# Patient Record
Sex: Male | Born: 1960 | Race: Black or African American | Hispanic: No | Marital: Married | Smoking: Current every day smoker
Health system: Southern US, Community
[De-identification: ages and names within clinical notes are randomized; demographics above are authoritative.]

## PROBLEM LIST (undated history)

## (undated) DIAGNOSIS — M1990 Unspecified osteoarthritis, unspecified site: Secondary | ICD-10-CM

## (undated) DIAGNOSIS — G8929 Other chronic pain: Secondary | ICD-10-CM

## (undated) DIAGNOSIS — I10 Essential (primary) hypertension: Secondary | ICD-10-CM

## (undated) DIAGNOSIS — K219 Gastro-esophageal reflux disease without esophagitis: Secondary | ICD-10-CM

## (undated) DIAGNOSIS — M549 Dorsalgia, unspecified: Secondary | ICD-10-CM

## (undated) DIAGNOSIS — R011 Cardiac murmur, unspecified: Secondary | ICD-10-CM

## (undated) DIAGNOSIS — Z91148 Patient's other noncompliance with medication regimen for other reason: Secondary | ICD-10-CM

## (undated) DIAGNOSIS — Z9114 Patient's other noncompliance with medication regimen: Secondary | ICD-10-CM

## (undated) HISTORY — DX: Patient's other noncompliance with medication regimen: Z91.14

## (undated) HISTORY — PX: OTHER SURGICAL HISTORY: SHX169

## (undated) HISTORY — DX: Other chronic pain: G89.29

## (undated) HISTORY — DX: Patient's other noncompliance with medication regimen for other reason: Z91.148

## (undated) HISTORY — DX: Dorsalgia, unspecified: M54.9

## (undated) HISTORY — DX: Essential (primary) hypertension: I10

---

## 2005-07-02 ENCOUNTER — Emergency Department (HOSPITAL_COMMUNITY): Admission: EM | Admit: 2005-07-02 | Discharge: 2005-07-02 | Payer: Self-pay | Admitting: Emergency Medicine

## 2009-11-01 ENCOUNTER — Emergency Department (HOSPITAL_COMMUNITY): Admission: EM | Admit: 2009-11-01 | Discharge: 2009-11-01 | Payer: Self-pay | Admitting: Emergency Medicine

## 2010-07-14 LAB — URINALYSIS, ROUTINE W REFLEX MICROSCOPIC
Bilirubin Urine: NEGATIVE
Glucose, UA: NEGATIVE mg/dL
Ketones, ur: NEGATIVE mg/dL
Leukocytes, UA: NEGATIVE
Nitrite: NEGATIVE
Specific Gravity, Urine: >1.03 — ABNORMAL HIGH (ref 1.005–1.030)
Urobilinogen, UA: 0.2 mg/dL (ref 0.0–1.0)
pH: 5 (ref 5.0–8.0)

## 2010-07-14 LAB — URINE MICROSCOPIC-ADD ON

## 2010-07-14 LAB — RPR: RPR Ser Ql: NONREACTIVE

## 2010-07-14 LAB — POCT I-STAT, CHEM 8
BUN: 13 mg/dL (ref 6–23)
Calcium, Ion: 1.13 mmol/L (ref 1.12–1.32)
Chloride: 106 mEq/L (ref 96–112)
Creatinine, Ser: 1.1 mg/dL (ref 0.4–1.5)
Glucose, Bld: 66 mg/dL — ABNORMAL LOW (ref 70–99)
HCT: 49 % (ref 39.0–52.0)
Hemoglobin: 16.7 g/dL (ref 13.0–17.0)
Potassium: 4.1 mEq/L (ref 3.5–5.1)
Sodium: 141 mEq/L (ref 135–145)
TCO2: 27 mmol/L (ref 0–100)

## 2010-07-14 LAB — GC/CHLAMYDIA PROBE AMP, GENITAL
Chlamydia, DNA Probe: NEGATIVE
GC Probe Amp, Genital: NEGATIVE

## 2010-11-11 ENCOUNTER — Emergency Department (HOSPITAL_COMMUNITY)
Admission: EM | Admit: 2010-11-11 | Discharge: 2010-11-11 | Disposition: A | Discharge status: Home / Self Care | Payer: Worker's Compensation | Attending: Emergency Medicine | Admitting: Emergency Medicine

## 2010-11-11 ENCOUNTER — Emergency Department (HOSPITAL_COMMUNITY): Payer: Worker's Compensation

## 2010-11-11 DIAGNOSIS — M25519 Pain in unspecified shoulder: Secondary | ICD-10-CM | POA: Insufficient documentation

## 2010-11-11 DIAGNOSIS — M25529 Pain in unspecified elbow: Secondary | ICD-10-CM | POA: Insufficient documentation

## 2010-11-11 DIAGNOSIS — R0602 Shortness of breath: Secondary | ICD-10-CM | POA: Insufficient documentation

## 2010-11-11 DIAGNOSIS — R51 Headache: Secondary | ICD-10-CM | POA: Insufficient documentation

## 2010-11-11 DIAGNOSIS — S51009A Unspecified open wound of unspecified elbow, initial encounter: Secondary | ICD-10-CM | POA: Insufficient documentation

## 2010-11-11 DIAGNOSIS — Y990 Civilian activity done for income or pay: Secondary | ICD-10-CM | POA: Insufficient documentation

## 2010-11-11 DIAGNOSIS — S0100XA Unspecified open wound of scalp, initial encounter: Secondary | ICD-10-CM | POA: Insufficient documentation

## 2010-11-11 DIAGNOSIS — W240XXA Contact with lifting devices, not elsewhere classified, initial encounter: Secondary | ICD-10-CM | POA: Insufficient documentation

## 2010-11-11 DIAGNOSIS — S0990XA Unspecified injury of head, initial encounter: Secondary | ICD-10-CM | POA: Insufficient documentation

## 2010-11-11 DIAGNOSIS — R0789 Other chest pain: Secondary | ICD-10-CM | POA: Insufficient documentation

## 2010-11-11 LAB — ETHANOL: Alcohol, Ethyl (B): <11 mg/dL (ref 0–11)

## 2010-11-11 LAB — POCT I-STAT, CHEM 8
BUN: 18 mg/dL (ref 6–23)
Calcium, Ion: 1.18 mmol/L (ref 1.12–1.32)
Chloride: 105 mEq/L (ref 96–112)
Creatinine, Ser: 1 mg/dL (ref 0.50–1.35)
Glucose, Bld: 97 mg/dL (ref 70–99)
HCT: 43 % (ref 39.0–52.0)
Hemoglobin: 14.6 g/dL (ref 13.0–17.0)
Potassium: 4.1 mEq/L (ref 3.5–5.1)
Sodium: 142 mEq/L (ref 135–145)
TCO2: 25 mmol/L (ref 0–100)

## 2010-11-11 LAB — PROTIME-INR
INR: 1.08 (ref 0.00–1.49)
Prothrombin Time: 14.2 s (ref 11.6–15.2)

## 2010-11-16 ENCOUNTER — Encounter: Payer: Self-pay | Admitting: *Deleted

## 2010-11-16 ENCOUNTER — Emergency Department (HOSPITAL_COMMUNITY): Payer: Worker's Compensation

## 2010-11-16 ENCOUNTER — Emergency Department (HOSPITAL_COMMUNITY)
Admission: EM | Admit: 2010-11-16 | Discharge: 2010-11-16 | Disposition: A | Discharge status: Home / Self Care | Payer: Worker's Compensation | Attending: Emergency Medicine | Admitting: Emergency Medicine

## 2010-11-16 DIAGNOSIS — R079 Chest pain, unspecified: Secondary | ICD-10-CM | POA: Insufficient documentation

## 2010-11-16 DIAGNOSIS — M25519 Pain in unspecified shoulder: Secondary | ICD-10-CM

## 2010-11-16 DIAGNOSIS — F17200 Nicotine dependence, unspecified, uncomplicated: Secondary | ICD-10-CM | POA: Insufficient documentation

## 2010-11-16 DIAGNOSIS — S2239XA Fracture of one rib, unspecified side, initial encounter for closed fracture: Secondary | ICD-10-CM | POA: Insufficient documentation

## 2010-11-16 DIAGNOSIS — W240XXA Contact with lifting devices, not elsewhere classified, initial encounter: Secondary | ICD-10-CM | POA: Insufficient documentation

## 2010-11-16 MED ORDER — OXYCODONE-ACETAMINOPHEN 5-325 MG PO TABS
1.0000 | ORAL_TABLET | Freq: Once | ORAL | Status: AC
  Administered 2010-11-16: 1 via ORAL
  Filled 2010-11-16: qty 1

## 2010-11-16 MED ORDER — OXYCODONE-ACETAMINOPHEN 5-325 MG PO TABS
1.0000 | ORAL_TABLET | ORAL | Status: AC | PRN
Start: 2010-11-16 — End: 2010-11-26

## 2010-11-16 NOTE — ED Notes (Signed)
Pain in left shoulder and left anterior rib area, hurts to move

## 2010-11-16 NOTE — ED Provider Notes (Signed)
History     Chief Complaint  Patient presents with  . Rib pain    The history is provided by the patient.  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 He was given Hydrocodone and Ibuprofen for pain, but has been having increasing pain in the left rib cage. He is also complaining that he can't lift his left arm. Pain is worse with movement and with palpation. Pain is severe - currently rated 8/10. He denies other problems.  History reviewed. No pertinent past medical history.  Past Surgical History  Procedure Date  . Right arm surgery     No family history on file.  History  Substance Use Topics  . Smoking status: Current Everyday Smoker -- 0.5 packs/day    Types: Cigarettes  . Smokeless tobacco: Not on file  . Alcohol Use: Yes     OCC      Review of Systems  Cardiovascular: Positive for chest pain.  All other systems reviewed and are negative.    Physical Exam  BP 154/82  Pulse 63  Temp(Src) 98 F (36.7 C) (Oral)  Resp 16  Ht 6\' 3"  (1.905 m)  Wt 215 lb (97.523 kg)  BMI 26.87 kg/m2  SpO2 98%  Physical Exam  Nursing note and vitals reviewed. Constitutional: He is oriented to person, place, and time. He appears well-developed and well-nourished. No distress.  HENT:  Head: Normocephalic.  Right Ear: External ear normal.  Left Ear: External ear normal.  Mouth/Throat: Oropharynx is clear and moist.       Scalp laceration with staples present, healing well.  Eyes: EOM are normal. Pupils are equal, round, and reactive to light. Left eye exhibits no discharge. No scleral icterus.  Neck: Normal range of motion. Neck supple. No JVD present.  Cardiovascular: Normal rate, regular rhythm and normal heart sounds.   No murmur heard. Pulmonary/Chest: Effort normal and breath sounds normal. He has no wheezes. He has no rales.       Marked tenderness left lateral chest wall.    Abdominal: Soft. Bowel sounds are normal. He exhibits no mass. There is no tenderness.  Musculoskeletal: He exhibits no edema.       Mild pain on passive ROM left shoulder. Pulses are strong, but there is an area of the left forearm with slightly decreased pinprick sensation.  Lymphadenopathy:    He has no cervical adenopathy.  Neurological: He is alert and oriented to person, place, and time. No cranial nerve deficit. Coordination normal.  Skin: Skin is warm and dry. No rash noted.  Psychiatric: He has a normal mood and affect.    ED Course  Procedures  MDM ED record from 11/11/2010 reviewed. He had a negative chest x-ray and left shoulder x-ray. CT of cervical spine showed degenerative changes without acute injury. He was discharged with prescriptions for Vicodin and Ibuprofen.  Satisfactory pain relief from Percocet. Placed in a sling for comfort and referred to orthopedics.  Results for orders placed during the hospital encounter of 11/11/10  POCT I-STAT, CHEM 8      Component Value Range   Sodium 142  135 - 145 (mEq/L)   Potassium 4.1  3.5 - 5.1 (mEq/L)   Chloride 105  96 - 112 (mEq/L)   BUN 18  6 - 23 (mg/dL)   Creatinine, Ser 1.61  0.50 - 1.35 (mg/dL)   Glucose, Bld 97  70 - 99 (mg/dL)   Calcium, Ion 1.61  0.96 - 1.32 (mmol/L)   TCO2 25  0 - 100 (mmol/L)   Hemoglobin 14.6  13.0 - 17.0 (g/dL)   HCT 04.5  40.9 - 81.1 (%)  PROTIME-INR      Component Value Range   Prothrombin Time 14.2  11.6 - 15.2 (seconds)   INR 1.08  0.00 - 1.49   ETHANOL      Component Value Range   Alcohol, Ethyl (B) <11  0 - 11 (mg/dL)   Dg Chest 2 View  01/10/7828  *RADIOLOGY REPORT*  Clinical Data: Shortness of breath and chest discomfort after a fall.  CHEST - 2 VIEW  Comparison: None.  Findings: Trachea is midline.  Heart size is accentuated by AP technique.  There may be minimal subsegmental atelectasis at the left lung base.  Lungs are otherwise clear.  No pleural fluid.  IMPRESSION: Minimal  left basilar subsegmental atelectasis.  Original Report Authenticated By: Reyes Ivan, M.D.   Dg Ribs Unilateral W/chest Left  11/16/2010  *RADIOLOGY REPORT*  Clinical Data: Left rib pain.  Forklift injury.  LEFT RIBS AND CHEST - 3+ VIEW  Comparison: 11/11/2010  Findings: No pneumothorax or pleural effusion identified.  Cardiac and mediastinal contours appear unremarkable.  The lungs appear clear.  Subtle irregularity of the left lateral eighth rib could possibly reflect fracture.  IMPRESSION:  1.  Subtle irregularity of the left lateral eighth rib, possibly a nondisplaced fracture.   Otherwise, no significant abnormality identified.  Original Report Authenticated By: Dellia Cloud, M.D.   Dg Elbow Complete Right  11/11/2010  *RADIOLOGY REPORT*  Clinical Data: Laceration or posterior elbow.  RIGHT ELBOW - COMPLETE 3+ VIEW  Comparison: None  Findings: No acute bony abnormality.  Specifically, no fracture, subluxation, or dislocation.  Soft tissues are intact.  No joint effusion.  IMPRESSION: No acute bony abnormality.  Original Report Authenticated By: Cyndie Chime, M.D.   Ct Head Wo Contrast  11/11/2010  *RADIOLOGY REPORT*  Clinical Data:  Fall.  Pain  CT HEAD WITHOUT CONTRAST CT CERVICAL SPINE WITHOUT CONTRAST  Technique:  Multidetector CT imaging of the head and cervical spine was performed following the standard protocol without intravenous contrast.  Multiplanar CT image reconstructions of the cervical spine were also generated.  Comparison:  None.  CT HEAD  Findings: Ventricle size is normal.  Negative for intracranial hemorrhage.  Negative for infarct or mass.  Calvarium is intact. Mild sinusitis.  IMPRESSION: No acute intracranial abnormality.  Sinusitis  CT CERVICAL SPINE  Findings: Negative for fracture.  Normal cervical alignment.  Moderate disc degeneration and spurring C3-4 and C4-5.  Mild disc degeneration and spurring C4-5 and C5-6.  IMPRESSION: Negative for fracture.  Original  Report Authenticated By: Camelia Phenes, M.D.   Ct Cervical Spine Wo Contrast  11/11/2010  *RADIOLOGY REPORT*  Clinical Data:  Fall.  Pain  CT HEAD WITHOUT CONTRAST CT CERVICAL SPINE WITHOUT CONTRAST  Technique:  Multidetector CT imaging of the head and cervical spine was performed following the standard protocol without intravenous contrast.  Multiplanar CT image reconstructions of the cervical spine were also generated.  Comparison:  None.  CT HEAD  Findings: Ventricle size is normal.  Negative for intracranial hemorrhage.  Negative for infarct or mass.  Calvarium is intact. Mild sinusitis.  IMPRESSION: No acute intracranial abnormality.  Sinusitis  CT CERVICAL SPINE  Findings: Negative for fracture.  Normal cervical alignment.  Moderate disc degeneration and spurring  C3-4 and C4-5.  Mild disc degeneration and spurring C4-5 and C5-6.  IMPRESSION: Negative for fracture.  Original Report Authenticated By: Camelia Phenes, M.D.   Dg Shoulder Left  11/11/2010  *RADIOLOGY REPORT*  Clinical Data: Severe pain in the left shoulder.  LEFT SHOULDER - 2+ VIEW  Comparison: None.  Findings: Mild degenerative changes in the left AC joint. Glenohumeral joint is unremarkable.  No fracture, subluxation or dislocation.  There is a question of a small nodular density in the left upper lobe.  However, when comparing to today's chest x-ray, no abnormalities seen in this area.  This is felt to represent overlapping shadows.  IMPRESSION: Degenerative changes in the left AC joint.  No acute findings.  Original Report Authenticated By: Cyndie Chime, M.D.         Dione Booze, MD 11/16/10 1052

## 2010-11-16 NOTE — ED Notes (Signed)
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 Was seen at [facility]. Pt states left rib pain and left arm pain began on Tuesday. Also states left leg goes  Numb when standing or sitting too long.

## 2010-11-21 ENCOUNTER — Encounter (HOSPITAL_COMMUNITY): Payer: Self-pay

## 2010-11-21 ENCOUNTER — Emergency Department (HOSPITAL_COMMUNITY)
Admission: EM | Admit: 2010-11-21 | Discharge: 2010-11-21 | Disposition: A | Discharge status: Home / Self Care | Payer: Worker's Compensation | Attending: Emergency Medicine | Admitting: Emergency Medicine

## 2010-11-21 DIAGNOSIS — Z4802 Encounter for removal of sutures: Secondary | ICD-10-CM | POA: Insufficient documentation

## 2010-11-21 DIAGNOSIS — IMO0002 Reserved for concepts with insufficient information to code with codable children: Secondary | ICD-10-CM

## 2010-11-21 NOTE — ED Provider Notes (Signed)
History     Chief Complaint  Patient presents with   Suture / Staple Removal   Patient is a 50 y.o. male presenting with suture removal. The history is provided by the patient. No language interpreter was used.  Suture / Staple Removal  The sutures were placed 7 to 10 days ago. Treatments since wound repair include regular soap and water washings. There has been no drainage from the wound. There is no redness present. There is no swelling present. The pain has no pain. He has no difficulty moving the affected extremity or digit.  Patient to ED for suture removal from right elbow and staple removal from right parietal scalp which were placed 10 days ago in ED. 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 No other complaints. Denies HA, fever, erythema.  History reviewed. No pertinent past medical history.  Past Surgical History  Procedure Date   Right arm surgery     History reviewed. No pertinent family history.  History  Substance Use Topics   Smoking status: Current Everyday Smoker -- 0.5 packs/day    Types: Cigarettes   Smokeless tobacco: Not on file   Alcohol Use: Yes     OCC      Review of Systems  Constitutional: Negative for fever.  HENT: Negative for neck pain.   Gastrointestinal: Negative for nausea and vomiting.  Skin: Negative for color change and rash.  Neurological: Negative for headaches.  All other systems reviewed and are negative.  All other systems negative except as noted in HPI.   Physical Exam  BP 132/79   Pulse 71   Temp(Src) 97.8 F (36.6 C) (Oral)   Resp 20   Ht 6\' 3"  (1.905 m)   Wt 215 lb (97.523 kg)   BMI 26.87 kg/m2   SpO2 97%  Physical Exam  Nursing note and vitals reviewed. Constitutional: He is oriented to person, place, and time. He appears well-developed and well-nourished. No distress.       Appearance consistent with age of  record  HENT:  Head: Normocephalic.  Right Ear: External ear normal.  Left Ear: External ear normal.  Mouth/Throat: Oropharynx is clear and moist.       16 staples to right parietal region with no signs of infection, erythema or swelling noted.   Eyes: Conjunctivae are normal.  Neck: Normal range of motion. Neck supple.  Cardiovascular: Normal rate and regular rhythm.   Pulmonary/Chest: Effort normal. He has no rhonchi.  Musculoskeletal: Normal range of motion.       Normal appearance of extremities  Neurological: He is alert and oriented to person, place, and time. No sensory deficit.  Skin: Skin is warm and dry. No rash noted. No erythema.       Color normal. Stiches to right elbow with no signs of infection, erythema or swelling noted.   Psychiatric: He has a normal mood and affect. His behavior is normal.    ED Course  Procedures  MDM Wounds healing well at this time with no signs of infection noted.    Chart written by Clarita Crane acting as scribe for Donnetta Hutching, MD  I personally performed the services described in this documentation, which was scribed in my presence. The recorded information has been reviewed and considered. Donnetta Hutching, MD

## 2010-11-21 NOTE — ED Provider Notes (Addendum)
History     Chief Complaint  Patient presents with  . Suture / Staple Removal   HPI rmove staples/stitches History reviewed. No pertinent past medical history.  Past Surgical History  Procedure Date  . Right arm surgery     History reviewed. No pertinent family history.  History  Substance Use Topics  . Smoking status: Current Everyday Smoker -- 0.5 packs/day    Types: Cigarettes  . Smokeless tobacco: Not on file  . Alcohol Use: Yes     OCC      Review of Systems All other systems negative except as noted in HPI.   Physical Exam  BP 132/79  Pulse 71  Temp(Src) 97.8 F (36.6 C) (Oral)  Resp 20  Ht 6\' 3"  (1.905 m)  Wt 215 lb (97.523 kg)  BMI 26.87 kg/m2  SpO2 97%  Physical Exam Patient with stables in right parietal scalp which is healing well stitches in right elbow ED Course  Procedures  MDM Wounds healing well    Chart written by Clarita Crane acting as scribe for Donnetta Hutching, MD  I personally performed the services described in this documentation, which was scribed in my presence. The recorded information has been reviewed and considered. Donnetta Hutching, MD    Donnetta Hutching, MD 11/21/10 5621  Donnetta Hutching, MD 11/21/10 [phone]

## 2010-11-21 NOTE — ED Notes (Signed)
Stitches removed from right elbow and rt side of top of head prior to discharge. No bleeding or drainage.

## 2010-11-21 NOTE — ED Notes (Signed)
Pt has staples put in his head and rt elbow on the July 16th.  He is here to have them removed

## 2010-11-21 NOTE — ED Notes (Signed)
Pt here to have stitches and staples removed. Pt has staples in place on the right side of the top of his head. Also has stitches in place rt elbow. No redness or drainage noted. Pt states that he has had them in place 10 days.

## 2010-11-24 ENCOUNTER — Encounter (HOSPITAL_COMMUNITY): Payer: Self-pay | Admitting: *Deleted

## 2010-11-24 ENCOUNTER — Emergency Department (HOSPITAL_COMMUNITY)
Admission: EM | Admit: 2010-11-24 | Discharge: 2010-11-25 | Disposition: A | Discharge status: Home / Self Care | Payer: Worker's Compensation | Attending: Emergency Medicine | Admitting: Emergency Medicine

## 2010-11-24 DIAGNOSIS — S2239XA Fracture of one rib, unspecified side, initial encounter for closed fracture: Secondary | ICD-10-CM | POA: Insufficient documentation

## 2010-11-24 DIAGNOSIS — M79629 Pain in unspecified upper arm: Secondary | ICD-10-CM

## 2010-11-24 DIAGNOSIS — M79609 Pain in unspecified limb: Secondary | ICD-10-CM | POA: Insufficient documentation

## 2010-11-24 DIAGNOSIS — W240XXA Contact with lifting devices, not elsewhere classified, initial encounter: Secondary | ICD-10-CM | POA: Insufficient documentation

## 2010-11-24 DIAGNOSIS — F17200 Nicotine dependence, unspecified, uncomplicated: Secondary | ICD-10-CM | POA: Insufficient documentation

## 2010-11-24 DIAGNOSIS — IMO0001 Reserved for inherently not codable concepts without codable children: Secondary | ICD-10-CM | POA: Insufficient documentation
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
## 2010-11-24 NOTE — ED Provider Notes (Signed)
History     Chief Complaint  Patient presents with  . Muscle Pain   HPI  History reviewed. No pertinent past medical history.  Past Surgical History  Procedure Date  . Right arm surgery     No family history on file.  History  Substance Use Topics  . Smoking status: Current Everyday Smoker -- 0.5 packs/day    Types: Cigarettes  . Smokeless tobacco: Not on file  . Alcohol Use: Yes     OCC      Review of Systems  Respiratory: Negative for shortness of breath and wheezing.        Pain from L rib fx.  Musculoskeletal:       L arm and shoulder pain.  In sling.  All other systems reviewed and are negative.    Physical Exam  BP 136/80  Pulse 70  Resp 18  Ht 6\' 3"  (1.905 m)  Wt 215 lb (97.523 kg)  BMI 26.87 kg/m2  SpO2 99%  Physical Exam  Nursing note and vitals reviewed. Constitutional: He is oriented to person, place, and time. Vital signs are normal. He appears well-developed and well-nourished.  HENT:  Head: Normocephalic and atraumatic.  Right Ear: External ear normal.  Left Ear: External ear normal.  Nose: Nose normal.  Mouth/Throat: No oropharyngeal exudate.  Eyes: Conjunctivae and EOM are normal. Pupils are equal, round, and reactive to light. Right eye exhibits no discharge. Left eye exhibits no discharge. No scleral icterus.  Neck: Normal range of motion. Neck supple. No JVD present. No tracheal deviation present. No thyromegaly present.  Cardiovascular: Normal rate, regular rhythm, normal heart sounds, intact distal pulses and normal pulses.  Exam reveals no gallop and no friction rub.   No murmur heard. Pulmonary/Chest: Effort normal and breath sounds normal. No stridor. No respiratory distress. He has no wheezes. He has no rales. He exhibits tenderness.  Abdominal: Soft. Normal appearance and bowel sounds are normal. He exhibits no distension and no mass. There is no tenderness. There is no rebound and no guarding.  Musculoskeletal: He exhibits no  edema and no tenderness.       Arms:      L arm and shoulder pain.  Worse with movement.  Lymphadenopathy:    He has no cervical adenopathy.  Neurological: He is alert and oriented to person, place, and time. He has normal reflexes. No cranial nerve deficit. Coordination normal. GCS eye subscore is 4. GCS verbal subscore is 5. GCS motor subscore is 6.  Reflex Scores:      Tricep reflexes are 2+ on the right side and 2+ on the left side.      Bicep reflexes are 2+ on the right side and 2+ on the left side.      Brachioradialis reflexes are 2+ on the right side and 2+ on the left side.      Patellar reflexes are 2+ on the right side and 2+ on the left side.      Achilles reflexes are 2+ on the right side and 2+ on the left side. Skin: Skin is warm and dry. No rash noted. He is not diaphoretic.  Psychiatric: He has a normal mood and affect. His speech is normal and behavior is normal. Judgment and thought content normal. Cognition and memory are normal.    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  He is out of oxycodone and is scheduled to  see dr. Hilda Lias tomorrow for therapy.      Worthy Rancher, PA 11/24/10 2353  Worthy Rancher, PA 11/24/10 2355  Worthy Rancher, PA 11/24/10 2357  Medical screening examination/treatment/procedure(s) were conducted as a shared visit with non-physician practitioner(s) and myself.  I personally evaluated the patient during the encounter  Nicoletta Dress. Colon Branch, MD 11/29/10 [phone]

## 2010-11-25 MED ORDER — IBUPROFEN 800 MG PO TABS
800.0000 mg | ORAL_TABLET | Freq: Once | ORAL | Status: AC
  Administered 2010-11-25: 800 mg via ORAL
  Filled 2010-11-25: qty 1

## 2010-11-25 MED ORDER — OXYCODONE-ACETAMINOPHEN 5-325 MG PO TABS: ORAL_TABLET | ORAL | Status: DC

## 2010-11-25 MED ORDER — OXYCODONE-ACETAMINOPHEN 5-325 MG PO TABS
1.0000 | ORAL_TABLET | Freq: Once | ORAL | Status: AC
  Administered 2010-11-25: 1 via ORAL
  Filled 2010-11-25: qty 1

## 2011-02-17 NOTE — ED Provider Notes (Signed)
History     CSN: [id number] Arrival date & time: 11/24/2010 10:55 PM   None     Chief Complaint  Patient presents with  . Muscle Pain    (Consider location/radiation/quality/duration/timing/severity/associated sxs/prior treatment) HPI Comments: Pt was seen in ED on 7-21=12 and x with L 8th rib fracture and L shoulder/neck/upper L arm pain.  Out of pain med.  Scheduled to see dr. Hilda Lias tomorrow  Patient is a 50 y.o. male presenting with musculoskeletal pain. The history is provided by the patient. No language interpreter was used.  Muscle Pain This is a new problem. Episode onset: 7-8 days ago. The problem occurs constantly. The problem has been unchanged. Associated symptoms include chest pain. The symptoms are aggravated by coughing, bending, twisting, walking and sneezing. He has tried NSAIDs and oral narcotics for the symptoms. The treatment provided moderate relief.    History reviewed. No pertinent past medical history.  Past Surgical History  Procedure Date  . Right arm surgery     No family history on file.  History  Substance Use Topics  . Smoking status: Current Everyday Smoker -- 0.5 packs/day    Types: Cigarettes  . Smokeless tobacco: Not on file  . Alcohol Use: Yes     OCC      Review of Systems  Cardiovascular: Positive for chest pain.  All other systems reviewed and are negative.    Allergies  Review of patient's allergies indicates no known allergies.  Home Medications   Current Outpatient Rx  Name Route Sig Dispense Refill  . IBUPROFEN 600 MG PO TABS Oral Take 600 mg by mouth every 6 (six) hours as needed. Pain     . IBUPROFEN 200 MG PO TABS Oral Take 600 mg by mouth every 6 (six) hours as needed. FOR PAIN  OTC     . OXYCODONE-ACETAMINOPHEN 5-325 MG PO TABS  One tab po q 4-6 hrs prn pain 20 tablet 0    BP 136/80  Pulse 70  Resp 18  Ht 6\' 3"  (1.905 m)  Wt 215 lb (97.523 kg)  BMI 26.87 kg/m2  SpO2 99%  Physical Exam  Nursing note and  vitals reviewed. Constitutional: He is oriented to person, place, and time. He appears well-developed and well-nourished.  HENT:  Head: Normocephalic and atraumatic.  Eyes: EOM are normal.  Neck: Normal range of motion.  Cardiovascular: Normal rate, regular rhythm, normal heart sounds and intact distal pulses.   Pulmonary/Chest: Effort normal and breath sounds normal. No accessory muscle usage. Not tachypneic. No respiratory distress. He exhibits tenderness.  Abdominal: Soft. He exhibits no distension. There is no tenderness.  Musculoskeletal: Normal range of motion.  Neurological: He is alert and oriented to person, place, and time.  Skin: Skin is warm and dry.  Psychiatric: He has a normal mood and affect. Judgment normal.    ED Course  Procedures (including critical care time)  Labs Reviewed - No data to display No results found.   1. Upper arm pain   2. Closed fracture of rib(s), unspecified       MDM          Worthy Rancher, PA 02/17/11 1539  Worthy Rancher, PA 03/06/11 [id number]

## 2011-03-10 NOTE — ED Provider Notes (Signed)
Medical screening examination/treatment/procedure(s) were performed by non-physician practitioner and as supervising physician I was immediately available for consultation/collaboration.  Nicoletta Dress. Colon Branch, MD 03/10/11 [phone]

## 2011-05-22 ENCOUNTER — Encounter (HOSPITAL_COMMUNITY): Payer: Self-pay | Admitting: *Deleted

## 2011-05-22 ENCOUNTER — Emergency Department (HOSPITAL_COMMUNITY)
Admission: EM | Admit: 2011-05-22 | Discharge: 2011-05-22 | Disposition: A | Discharge status: Home / Self Care | Payer: Self-pay | Attending: Emergency Medicine | Admitting: Emergency Medicine

## 2011-05-22 DIAGNOSIS — F17200 Nicotine dependence, unspecified, uncomplicated: Secondary | ICD-10-CM | POA: Insufficient documentation

## 2011-05-22 DIAGNOSIS — G8929 Other chronic pain: Secondary | ICD-10-CM | POA: Insufficient documentation

## 2011-05-22 DIAGNOSIS — M545 Low back pain, unspecified: Secondary | ICD-10-CM | POA: Insufficient documentation

## 2011-05-22 DIAGNOSIS — M549 Dorsalgia, unspecified: Secondary | ICD-10-CM

## 2011-05-22 DIAGNOSIS — R209 Unspecified disturbances of skin sensation: Secondary | ICD-10-CM | POA: Insufficient documentation

## 2011-05-22 DIAGNOSIS — M79609 Pain in unspecified limb: Secondary | ICD-10-CM | POA: Insufficient documentation

## 2011-05-22 MED ORDER — HYDROCODONE-ACETAMINOPHEN 5-325 MG PO TABS: ORAL_TABLET | ORAL | Status: DC

## 2011-05-22 MED ORDER — DEXAMETHASONE 6 MG PO TABS: ORAL_TABLET | ORAL | Status: AC

## 2011-05-22 NOTE — ED Provider Notes (Signed)
History     CSN: [id number]  Arrival date & time 05/22/11  1438   None     Chief Complaint  Patient presents with  . Back Pain    (Consider location/radiation/quality/duration/timing/severity/associated sxs/prior treatment) Patient is a 51 y.o. male presenting with back pain. The history is provided by the patient.  Back Pain  This is a chronic problem. The current episode started more than 1 week ago. The problem occurs daily. The problem has not changed since onset.The pain is present in the lumbar spine. The quality of the pain is described as aching. The pain radiates to the left thigh. The pain is moderate. The symptoms are aggravated by certain positions. Associated symptoms include numbness and tingling. Pertinent negatives include no chest pain, no abdominal pain, no bowel incontinence, no perianal numbness, no bladder incontinence and no dysuria. Treatments tried: Her own meds. The treatment provided no relief.    History reviewed. No pertinent past medical history.  Past Surgical History  Procedure Date  . Right arm surgery     History reviewed. No pertinent family history.  History  Substance Use Topics  . Smoking status: Current Everyday Smoker -- 0.5 packs/day    Types: Cigarettes  . Smokeless tobacco: Not on file  . Alcohol Use: No     OCC      Review of Systems  Constitutional: Negative for activity change.       All ROS Neg except as noted in HPI  HENT: Negative for nosebleeds and neck pain.   Eyes: Negative for photophobia and discharge.  Respiratory: Negative for cough, shortness of breath and wheezing.   Cardiovascular: Negative for chest pain and palpitations.  Gastrointestinal: Negative for abdominal pain, blood in stool and bowel incontinence.  Genitourinary: Negative for bladder incontinence, dysuria, frequency and hematuria.  Musculoskeletal: Positive for back pain. Negative for arthralgias.  Skin: Negative.   Neurological: Positive for  tingling and numbness. Negative for dizziness, seizures and speech difficulty.  Psychiatric/Behavioral: Negative for hallucinations and confusion.    Allergies  Review of patient's allergies indicates no known allergies.  Home Medications   Current Outpatient Rx  Name Route Sig Dispense Refill  . CALCIUM + D PO Oral Take 1 tablet by mouth daily.    . B-12 PO Oral Take 1 tablet by mouth daily.    Marland Kitchen VITAMIN C 500 MG PO TABS Oral Take 500 mg by mouth daily.      BP 164/83  Pulse 66  Temp(Src) 98.7 F (37.1 C) (Oral)  Resp 20  Ht 6\' 3"  (1.905 m)  Wt 230 lb (104.327 kg)  BMI 28.75 kg/m2  SpO2 98%  Physical Exam  Nursing note and vitals reviewed. Constitutional: He is oriented to person, place, and time. He appears well-developed and well-nourished.  Non-toxic appearance.  HENT:  Head: Normocephalic.  Right Ear: Tympanic membrane and external ear normal.  Left Ear: Tympanic membrane and external ear normal.  Eyes: EOM and lids are normal. Pupils are equal, round, and reactive to light.  Neck: Normal range of motion. Neck supple. Carotid bruit is not present.  Cardiovascular: Normal rate, regular rhythm, normal heart sounds, intact distal pulses and normal pulses.   Pulmonary/Chest: Breath sounds normal. No respiratory distress.  Abdominal: Soft. Bowel sounds are normal. There is no tenderness. There is no guarding.  Musculoskeletal: Normal range of motion.       Pain to palpation and attempted ROM of the lower back. No palpable deformity.  Lymphadenopathy:  Head (right side): No submandibular adenopathy present.       Head (left side): No submandibular adenopathy present.    He has no cervical adenopathy.  Neurological: He is alert and oriented to person, place, and time. He has normal strength. No cranial nerve deficit or sensory deficit. He exhibits normal muscle tone. Coordination normal.  Skin: Skin is warm and dry.  Psychiatric: He has a normal mood and affect. His  speech is normal.    ED Course  Procedures (including critical care time)  Labs Reviewed - No data to display No results found.   1. Back pain, chronic       MDM  I have reviewed nursing notes, vital signs, and all appropriate lab and imaging results for this patient.        Kathie Dike, [place] 05/27/11 1624
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
## 2011-05-31 NOTE — ED Provider Notes (Signed)
Medical screening examination/treatment/procedure(s) were performed by non-physician practitioner and as supervising physician I was immediately available for consultation/collaboration.  Results for orders placed during the hospital encounter of 11/11/10  POCT I-STAT, CHEM 8      Component Value Range   Sodium 142  135 - 145 (mEq/L)   Potassium 4.1  3.5 - 5.1 (mEq/L)   Chloride 105  96 - 112 (mEq/L)   BUN 18  6 - 23 (mg/dL)   Creatinine, Ser 1.61  0.50 - 1.35 (mg/dL)   Glucose, Bld 97  70 - 99 (mg/dL)   Calcium, Ion 0.96  0.45 - 1.32 (mmol/L)   TCO2 25  0 - 100 (mmol/L)   Hemoglobin 14.6  13.0 - 17.0 (g/dL)   HCT 40.9  81.1 - 91.4 (%)  PROTIME-INR      Component Value Range   Prothrombin Time 14.2  11.6 - 15.2 (seconds)   INR 1.08  0.00 - 1.49   ETHANOL      Component Value Range   Alcohol, Ethyl (B) <11  0 - 11 (mg/dL)   Results for orders placed during the hospital encounter of 11/11/10  POCT I-STAT, CHEM 8      Component Value Range   Sodium 142  135 - 145 (mEq/L)   Potassium 4.1  3.5 - 5.1 (mEq/L)   Chloride 105  96 - 112 (mEq/L)   BUN 18  6 - 23 (mg/dL)   Creatinine, Ser 7.82  0.50 - 1.35 (mg/dL)   Glucose, Bld 97  70 - 99 (mg/dL)   Calcium, Ion 9.56  2.13 - 1.32 (mmol/L)   TCO2 25  0 - 100 (mmol/L)   Hemoglobin 14.6  13.0 - 17.0 (g/dL)   HCT 08.6  57.8 - 46.9 (%)  PROTIME-INR      Component Value Range   Prothrombin Time 14.2  11.6 - 15.2 (seconds)   INR 1.08  0.00 - 1.49   ETHANOL      Component Value Range   Alcohol, Ethyl (B) <11  0 - 11 (mg/dL)   No results found.    Shelda Jakes, MD 05/31/11 [phone]

## 2011-11-06 ENCOUNTER — Ambulatory Visit (HOSPITAL_COMMUNITY)
Admission: RE | Admit: 2011-11-06 | Discharge: 2011-11-06 | Disposition: A | Discharge status: Home / Self Care | Payer: Self-pay | Source: Ambulatory Visit | Attending: *Deleted | Admitting: *Deleted

## 2011-11-06 ENCOUNTER — Other Ambulatory Visit (HOSPITAL_COMMUNITY): Payer: Self-pay | Admitting: *Deleted

## 2011-11-06 DIAGNOSIS — M549 Dorsalgia, unspecified: Secondary | ICD-10-CM

## 2011-11-06 DIAGNOSIS — M545 Low back pain, unspecified: Secondary | ICD-10-CM | POA: Insufficient documentation

## 2011-11-06 DIAGNOSIS — M5137 Other intervertebral disc degeneration, lumbosacral region: Secondary | ICD-10-CM | POA: Insufficient documentation

## 2011-11-06 DIAGNOSIS — R9389 Abnormal findings on diagnostic imaging of other specified body structures: Secondary | ICD-10-CM | POA: Insufficient documentation

## 2011-11-06 DIAGNOSIS — M51379 Other intervertebral disc degeneration, lumbosacral region without mention of lumbar back pain or lower extremity pain: Secondary | ICD-10-CM | POA: Insufficient documentation

## 2012-04-06 ENCOUNTER — Emergency Department (HOSPITAL_COMMUNITY)
Admission: EM | Admit: 2012-04-06 | Discharge: 2012-04-06 | Disposition: A | Discharge status: Home / Self Care | Payer: Self-pay | Attending: Emergency Medicine | Admitting: Emergency Medicine

## 2012-04-06 ENCOUNTER — Encounter (HOSPITAL_COMMUNITY): Payer: Self-pay | Admitting: Emergency Medicine

## 2012-04-06 DIAGNOSIS — M79609 Pain in unspecified limb: Secondary | ICD-10-CM | POA: Insufficient documentation

## 2012-04-06 DIAGNOSIS — G8929 Other chronic pain: Secondary | ICD-10-CM | POA: Insufficient documentation

## 2012-04-06 DIAGNOSIS — M5416 Radiculopathy, lumbar region: Secondary | ICD-10-CM

## 2012-04-06 DIAGNOSIS — F17200 Nicotine dependence, unspecified, uncomplicated: Secondary | ICD-10-CM | POA: Insufficient documentation

## 2012-04-06 DIAGNOSIS — Z87828 Personal history of other (healed) physical injury and trauma: Secondary | ICD-10-CM | POA: Insufficient documentation

## 2012-04-06 DIAGNOSIS — IMO0002 Reserved for concepts with insufficient information to code with codable children: Secondary | ICD-10-CM | POA: Insufficient documentation

## 2012-04-06 DIAGNOSIS — M542 Cervicalgia: Secondary | ICD-10-CM | POA: Insufficient documentation

## 2012-04-06 DIAGNOSIS — M545 Low back pain, unspecified: Secondary | ICD-10-CM | POA: Insufficient documentation

## 2012-04-06 DIAGNOSIS — R52 Pain, unspecified: Secondary | ICD-10-CM | POA: Insufficient documentation

## 2012-04-06 MED ORDER — OXYCODONE-ACETAMINOPHEN 5-325 MG PO TABS
1.0000 | ORAL_TABLET | Freq: Once | ORAL | Status: AC
  Administered 2012-04-06: 1 via ORAL
  Filled 2012-04-06: qty 1

## 2012-04-06 MED ORDER — PREDNISONE 50 MG PO TABS
60.0000 mg | ORAL_TABLET | Freq: Once | ORAL | Status: AC
  Administered 2012-04-06: 60 mg via ORAL
  Filled 2012-04-06: qty 1

## 2012-04-06 MED ORDER — CYCLOBENZAPRINE HCL 10 MG PO TABS
10.0000 mg | ORAL_TABLET | Freq: Once | ORAL | Status: AC
  Administered 2012-04-06: 10 mg via ORAL
  Filled 2012-04-06: qty 1

## 2012-04-06 MED ORDER — OXYCODONE-ACETAMINOPHEN 5-325 MG PO TABS
1.0000 | ORAL_TABLET | ORAL | Status: AC | PRN
Start: 2012-04-06 — End: 2012-04-16

## 2012-04-06 MED ORDER — PREDNISONE 10 MG PO TABS: ORAL_TABLET | ORAL | Status: DC

## 2012-04-06 MED ORDER — CYCLOBENZAPRINE HCL 5 MG PO TABS: 5.0000 mg | ORAL_TABLET | Freq: Three times a day (TID) | ORAL | Status: DC | PRN

## 2012-04-06 NOTE — ED Notes (Signed)
Pt c/o neck pain, lower back pain that he states is a chronic problem.
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
## 2012-04-08 NOTE — ED Provider Notes (Signed)
Medical screening examination/treatment/procedure(s) were performed by non-physician practitioner and as supervising physician I was immediately available for consultation/collaboration.  John-Adam Adianna Darwin, M.D.     John-Adam Nezar Buckles, MD 04/08/12 1439 

## 2012-05-20 ENCOUNTER — Emergency Department (HOSPITAL_COMMUNITY)
Admission: EM | Admit: 2012-05-20 | Discharge: 2012-05-20 | Disposition: A | Discharge status: Home / Self Care | Payer: Self-pay | Attending: Emergency Medicine | Admitting: Emergency Medicine

## 2012-05-20 ENCOUNTER — Encounter (HOSPITAL_COMMUNITY): Payer: Self-pay | Admitting: Emergency Medicine

## 2012-05-20 DIAGNOSIS — F17200 Nicotine dependence, unspecified, uncomplicated: Secondary | ICD-10-CM | POA: Insufficient documentation

## 2012-05-20 DIAGNOSIS — M549 Dorsalgia, unspecified: Secondary | ICD-10-CM | POA: Insufficient documentation

## 2012-05-20 DIAGNOSIS — K2970 Gastritis, unspecified, without bleeding: Secondary | ICD-10-CM | POA: Insufficient documentation

## 2012-05-20 DIAGNOSIS — K921 Melena: Secondary | ICD-10-CM | POA: Insufficient documentation

## 2012-05-20 DIAGNOSIS — R1013 Epigastric pain: Secondary | ICD-10-CM | POA: Insufficient documentation

## 2012-05-20 LAB — CBC WITH DIFFERENTIAL/PLATELET
Basophils Absolute: 0 10*3/uL (ref 0.0–0.1)
Basophils Relative: 0 % (ref 0–1)
Eosinophils Absolute: 0.1 10*3/uL (ref 0.0–0.7)
Eosinophils Relative: 1 % (ref 0–5)
HCT: 42.8 % (ref 39.0–52.0)
Hemoglobin: 14.9 g/dL (ref 13.0–17.0)
Lymphocytes Relative: 36 % (ref 12–46)
Lymphs Abs: 2.7 10*3/uL (ref 0.7–4.0)
MCH: 30.8 pg (ref 26.0–34.0)
MCHC: 34.8 g/dL (ref 30.0–36.0)
MCV: 88.6 fL (ref 78.0–100.0)
Monocytes Absolute: 0.8 10*3/uL (ref 0.1–1.0)
Monocytes Relative: 10 % (ref 3–12)
Neutro Abs: 4 10*3/uL (ref 1.7–7.7)
Neutrophils Relative %: 52 % (ref 43–77)
Platelets: 222 10*3/uL (ref 150–400)
RBC: 4.83 MIL/uL (ref 4.22–5.81)
RDW: 13.5 % (ref 11.5–15.5)
WBC: 7.6 10*3/uL (ref 4.0–10.5)

## 2012-05-20 LAB — COMPREHENSIVE METABOLIC PANEL
ALT: 18 U/L (ref 0–53)
AST: 18 U/L (ref 0–37)
Albumin: 3.9 g/dL (ref 3.5–5.2)
Alkaline Phosphatase: 71 U/L (ref 39–117)
BUN: 17 mg/dL (ref 6–23)
CO2: 27 mEq/L (ref 19–32)
Calcium: 9.3 mg/dL (ref 8.4–10.5)
Chloride: 101 mEq/L (ref 96–112)
Creatinine, Ser: 0.93 mg/dL (ref 0.50–1.35)
GFR calc Af Amer: >90 mL/min (ref >90)
GFR calc non Af Amer: >90 mL/min (ref >90)
Glucose, Bld: 111 mg/dL — ABNORMAL HIGH (ref 70–99)
Potassium: 3.6 mEq/L (ref 3.5–5.1)
Sodium: 137 mEq/L (ref 135–145)
Total Bilirubin: 0.3 mg/dL (ref 0.3–1.2)
Total Protein: 7.2 g/dL (ref 6.0–8.3)

## 2012-05-20 LAB — LIPASE, BLOOD: Lipase: 61 U/L — ABNORMAL HIGH (ref 11–59)

## 2012-05-20 MED ORDER — MORPHINE SULFATE 4 MG/ML IJ SOLN
4.0000 mg | Freq: Once | INTRAMUSCULAR | Status: AC
  Administered 2012-05-20: 4 mg via INTRAVENOUS
  Filled 2012-05-20: qty 1

## 2012-05-20 MED ORDER — SODIUM CHLORIDE 0.9 % IV BOLUS (SEPSIS)
1000.0000 mL | Freq: Once | INTRAVENOUS | Status: AC
  Administered 2012-05-20: 1000 mL via INTRAVENOUS

## 2012-05-20 MED ORDER — GI COCKTAIL ~~LOC~~
30.0000 mL | Freq: Once | ORAL | Status: AC
  Administered 2012-05-20: 30 mL via ORAL
  Filled 2012-05-20: qty 30

## 2012-05-20 MED ORDER — OMEPRAZOLE 20 MG PO CPDR: 20.0000 mg | DELAYED_RELEASE_CAPSULE | Freq: Every day | ORAL | Status: DC

## 2012-05-20 MED ORDER — HYDROCODONE-ACETAMINOPHEN 5-325 MG PO TABS
1.0000 | ORAL_TABLET | ORAL | Status: DC | PRN
Start: 2012-05-20 — End: 2012-07-01

## 2012-05-20 NOTE — ED Notes (Signed)
Pt alert & oriented x4, stable gait. Patient given discharge instructions, paperwork & prescription(s). Patient  instructed to stop at the registration desk to finish any additional paperwork. Patient verbalized understanding. Pt left department w/ no further questions. 

## 2012-05-20 NOTE — ED Provider Notes (Addendum)
History     CSN: [id number]  Arrival date & time 05/20/12  1633   First MD Initiated Contact with Patient 05/20/12 1658      Chief Complaint  Patient presents with  . Pain  . Rectal Bleeding    (Consider location/radiation/quality/duration/timing/severity/associated sxs/prior treatment) HPI Comments: Pt comes in with cc of abd pain, back pain and rectal bleed. He has hx of chronic abd back and neck pain - no medical problems. States that his back pain is no different that usual pain - it is located in the lumbar region and upper thoracic region. No new associated numbness, weakness, urinary incontinence, urinary retention, bowel incontinence. Pt started having some epigastric abd pain 2 days ago, and also noticed BRBPR. He had hematochezia 6 months ago, trace amount and he didn't see a physician for that. This time he had 2 BM, last one being y'day morning, that had mild blood in it. No hx of liver dz, alcohol abuse and pt has never had a colonoscopy, egd.   Patient is a 52 y.o. male presenting with hematochezia. The history is provided by the patient.  Rectal Bleeding  Associated symptoms include abdominal pain. Pertinent negatives include no fever, no rectal pain, no vomiting, no chest pain, no headaches and no coughing.    History reviewed. No pertinent past medical history.  Past Surgical History  Procedure Date  . Right arm surgery     No family history on file.  History  Substance Use Topics  . Smoking status: Current Every Day Smoker -- 0.5 packs/day    Types: Cigarettes  . Smokeless tobacco: Not on file  . Alcohol Use: No     Comment: OCC      Review of Systems  Constitutional: Negative for fever, chills, activity change and appetite change.  HENT: Negative for neck pain.   Eyes: Negative for visual disturbance.  Respiratory: Negative for cough, chest tightness and shortness of breath.   Cardiovascular: Negative for chest pain.  Gastrointestinal: Positive  for abdominal pain, blood in stool and hematochezia. Negative for vomiting, abdominal distention and rectal pain.  Genitourinary: Negative for dysuria, enuresis and difficulty urinating.  Musculoskeletal: Positive for back pain. Negative for arthralgias.  Neurological: Negative for dizziness, light-headedness and headaches.  Hematological: Does not bruise/bleed easily.  Psychiatric/Behavioral: Negative for confusion.    Allergies  Review of patient's allergies indicates no known allergies.  Home Medications   Current Outpatient Rx  Name  Route  Sig  Dispense  Refill  . CYCLOBENZAPRINE HCL 5 MG PO TABS   Oral   Take 1 tablet (5 mg total) by mouth 3 (three) times daily as needed for muscle spasms.   15 tablet   0   . PREDNISONE 10 MG PO TABS      6, 5, 4, 3, 2 then 1 tablet by mouth daily for 6 days total.   21 tablet   0     BP 170/73  Pulse 69  Temp 97.9 F (36.6 C)  Resp 20  Ht 6\' 3"  (1.905 m)  Wt 225 lb (102.059 kg)  BMI 28.12 kg/m2  SpO2 96%  Physical Exam  Nursing note and vitals reviewed. Constitutional: He is oriented to person, place, and time. He appears well-developed.  HENT:  Head: Normocephalic and atraumatic.  Eyes: Conjunctivae normal and EOM are normal. Pupils are equal, round, and reactive to light.  Neck: Normal range of motion. Neck supple.  Cardiovascular: Normal rate and regular rhythm.   Pulmonary/Chest:  Effort normal and breath sounds normal.  Abdominal: Soft. Bowel sounds are normal. He exhibits no distension. There is no tenderness. There is no rebound and no guarding.       Pt has external hemorrhoid, no active bleed, no ulcer per anoscopy, and no melena, BRBPR, guaiac neg stools.  Musculoskeletal:       Pt has tenderness over the lumbar region No step offs, no erythema. Pt has 2+ patellar reflex bilaterally. Able to discriminate between sharp and dull. Able to ambulate  Neurological: He is alert and oriented to person, place, and time.    Skin: Skin is warm.    ED Course  Procedures (including critical care time)   Labs Reviewed  CBC WITH DIFFERENTIAL  COMPREHENSIVE METABOLIC PANEL  LIPASE, BLOOD   No results found.   No diagnosis found.    MDM  Pt comes in with cc of abd pain, GI bleed and chronic back pain/  With the back pain there are no redflags, its a chronic pain for him, nothing more to do diagnostically in the ED this visit.  The abd pain is new, and he states he had hematochezia x 2. His rectal exam reveals no melena, BRBPR, and the stools were guaic neg. With the epigastric pain, UGIB/gastric ulcer still considered - so we will check his CBC, and depending on the Hb, he will be admitted, or discharged with GI follow up.    Derwood Kaplan, MD 05/20/12 1812  Hb is stable. Will discharge now. Will give GI followup  Derwood Kaplan, MD 05/20/12 1610

## 2012-05-20 NOTE — ED Notes (Signed)
Pt c/o chronic neck/lower back pain with numbness in left leg.. Pt also reports abd pain and dark red blood in stool x 3 days.

## 2012-06-10 ENCOUNTER — Ambulatory Visit: Payer: Self-pay | Admitting: Urgent Care

## 2012-07-01 ENCOUNTER — Encounter: Payer: Self-pay | Admitting: Urgent Care

## 2012-07-01 ENCOUNTER — Ambulatory Visit (INDEPENDENT_AMBULATORY_CARE_PROVIDER_SITE_OTHER): Payer: Self-pay | Admitting: Urgent Care

## 2012-07-01 VITALS — BP 158/88 | HR 57 | Temp 97.4°F | Ht 75.0 in | Wt 218.0 lb

## 2012-07-01 DIAGNOSIS — K921 Melena: Secondary | ICD-10-CM

## 2012-07-01 MED ORDER — PEG 3350-KCL-NA BICARB-NACL 420 G PO SOLR: 4000.0000 mL | ORAL | Status: DC

## 2012-07-01 NOTE — Progress Notes (Signed)
Faxed to PCP

## 2012-07-01 NOTE — Patient Instructions (Addendum)
Colonoscopy with Dr. Darrick Penna 1-800-QUIT-NOW for help quitting smoking Do not take Ibuprofen, Advil, headache powders, etc Followup with the Health Department about your blood pressure being elevated within the week Rectal Bleeding Rectal bleeding is when blood passes out of the anus. It is usually a sign that something is wrong. It may not be serious, but it should always be evaluated. Rectal bleeding may present as bright red blood or extremely dark stools. The color may range from dark red or maroon to black (like tar). It is important that the cause of rectal bleeding be identified so treatment can be started and the problem corrected. CAUSES   Hemorrhoids. These are enlarged (dilated) blood vessels or veins in the anal or rectal area.  Fistulas. Theseare abnormal, burrowing channels that usually run from inside the rectum to the skin around the anus. They can bleed.  Anal fissures. This is a tear in the tissue of the anus. Bleeding occurs with bowel movements.  Diverticulosis. This is a condition in which pockets or sacs project from the bowel wall. Occasionally, the sacs can bleed.  Diverticulitis. Thisis an infection involving diverticulosis of the colon.  Proctitis and colitis. These are conditions in which the rectum, colon, or both, can become inflamed and pitted (ulcerated).  Polyps and cancer. Polyps are non-cancerous (benign) growths in the colon that may bleed. Certain types of polyps turn into cancer.  Protrusion of the rectum. Part of the rectum can project from the anus and bleed.  Certain medicines.  Intestinal infections.  Blood vessel abnormalities. HOME CARE INSTRUCTIONS  Eat a high-fiber diet to keep your stool soft.  Limit activity.  Drink enough fluids to keep your urine clear or pale yellow.  Warm baths may be useful to soothe rectal pain.  Follow up with your caregiver as directed. SEEK IMMEDIATE MEDICAL CARE IF:  You develop increased  bleeding.  You have black or dark red stools.  You vomit blood or material that looks like coffee grounds.  You have abdominal pain or tenderness.  You have a fever.  You feel weak, nauseous, or you faint.  You have severe rectal pain or you are unable to have a bowel movement. MAKE SURE YOU:  Understand these instructions.  Will watch your condition.  Will get help right away if you are not doing well or get worse. Document Released: 10/04/2001 Document Revised: 07/07/2011 Document Reviewed: 09/29/2010 [facility] Patient Information 2013 [place], [place].

## 2012-07-01 NOTE — Assessment & Plan Note (Addendum)
Stephen Koch is a pleasant 52 y.o. male with an episode of severe abdominal pain followed by hematochezia that led him to the ER in January 2014. He has had occasional abdominal cramps, however no further bleeding since the initial episode. Hemoglobin was stable. He is a smoker. I suspect he may have had ischemic colitis. He was taking ibuprofen at the time. Differentials include benign anorectal bleeding, NSAID-induced colitis, diverticular bleeding or colorectal polyp or carcinoma.  Colonoscopy with Dr. Darrick Penna for further evaluation.  I have discussed risks & benefits which include, but are not limited to, bleeding, infection, perforation & drug reaction.  The patient agrees with this plan & written consent will be obtained.    Phenergan 25mg  IV 30 minutes prior to procedure to augment sedation given hx of chronic narcotic and marijuana use Rectal bleeding precautions and when to seek medical care discussed-handout given 1-800-QUIT-NOW for help quitting smoking Advised to quit marijuana Avoid Ibuprofen, Advil, headache powders, etc Advised to Followup with the Health Department about hypertension within the week
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
## 2012-07-02 ENCOUNTER — Encounter (HOSPITAL_COMMUNITY): Payer: Self-pay | Admitting: Pharmacy Technician

## 2012-07-06 ENCOUNTER — Ambulatory Visit (HOSPITAL_COMMUNITY)
Admission: RE | Admit: 2012-07-06 | Discharge: 2012-07-06 | Disposition: A | Discharge status: Home / Self Care | Payer: Self-pay | Source: Ambulatory Visit | Attending: Gastroenterology | Admitting: Gastroenterology

## 2012-07-06 ENCOUNTER — Encounter (HOSPITAL_COMMUNITY): Payer: Self-pay | Admitting: *Deleted

## 2012-07-06 ENCOUNTER — Encounter (HOSPITAL_COMMUNITY)
Admission: RE | Disposition: A | Discharge status: Home / Self Care | Payer: Self-pay | Source: Ambulatory Visit | Attending: Gastroenterology

## 2012-07-06 DIAGNOSIS — R109 Unspecified abdominal pain: Secondary | ICD-10-CM

## 2012-07-06 DIAGNOSIS — K921 Melena: Secondary | ICD-10-CM | POA: Insufficient documentation

## 2012-07-06 DIAGNOSIS — K648 Other hemorrhoids: Secondary | ICD-10-CM | POA: Insufficient documentation

## 2012-07-06 DIAGNOSIS — K625 Hemorrhage of anus and rectum: Secondary | ICD-10-CM

## 2012-07-06 HISTORY — PX: COLONOSCOPY: SHX5424

## 2012-07-06 SURGERY — COLONOSCOPY
Anesthesia: Moderate Sedation

## 2012-07-06 MED ORDER — PROMETHAZINE HCL 25 MG/ML IJ SOLN
25.0000 mg | Freq: Once | INTRAMUSCULAR | Status: AC
  Administered 2012-07-06: 25 mg via INTRAVENOUS

## 2012-07-06 MED ORDER — HYDROCORTISONE ACETATE 25 MG RE SUPP: 25.0000 mg | Freq: Two times a day (BID) | RECTAL | Status: DC

## 2012-07-06 MED ORDER — STERILE WATER FOR IRRIGATION IR SOLN
Status: DC | PRN
  Administered 2012-07-06: 12:00:00

## 2012-07-06 MED ORDER — SODIUM CHLORIDE 0.45 % IV SOLN
INTRAVENOUS | Status: DC
  Administered 2012-07-06: 12:00:00 via INTRAVENOUS

## 2012-07-06 MED ORDER — MIDAZOLAM HCL 5 MG/5ML IJ SOLN
INTRAMUSCULAR | Status: AC
  Filled 2012-07-06: qty 10

## 2012-07-06 MED ORDER — MEPERIDINE HCL 100 MG/ML IJ SOLN
INTRAMUSCULAR | Status: AC
  Filled 2012-07-06: qty 2

## 2012-07-06 MED ORDER — PROMETHAZINE HCL 25 MG/ML IJ SOLN
INTRAMUSCULAR | Status: AC
  Filled 2012-07-06: qty 1

## 2012-07-06 MED ORDER — MEPERIDINE HCL 100 MG/ML IJ SOLN
INTRAMUSCULAR | Status: DC | PRN
  Administered 2012-07-06 (×2): 50 mg via INTRAVENOUS

## 2012-07-06 MED ORDER — MIDAZOLAM HCL 5 MG/5ML IJ SOLN
INTRAMUSCULAR | Status: DC | PRN
  Administered 2012-07-06 (×2): 2 mg via INTRAVENOUS

## 2012-07-06 MED ORDER — SODIUM CHLORIDE 0.9 % IJ SOLN
INTRAMUSCULAR | Status: AC
  Filled 2012-07-06: qty 10

## 2012-07-06 NOTE — Op Note (Signed)
[facility] [address] [place], [address]   COLONOSCOPY PROCEDURE REPORT  PATIENT: Stephen, Koch  MR#: [id number] BIRTHDATE: 27-May-1960 , 52  yrs. old GENDER: Male ENDOSCOPIST: Jonette Eva, MD REFERRED IO:[id number] Muse, PA PROCEDURE DATE:  07/06/2012 PROCEDURE:   Colonoscopy, diagnostic INDICATIONS:Rectal Bleeding and abdominal pain. MEDICATIONS: Demerol 100 mg IV, Versed 4 mg IV, and PREOP-Promethazine (Phenergan) 25mg  IV  DESCRIPTION OF PROCEDURE:    Physical exam was performed.  Informed consent was obtained from the patient after explaining the benefits, risks, and alternatives to procedure.  The patient was connected to monitor and placed in left lateral position. Continuous oxygen was provided by nasal cannula and IV medicine administered through an indwelling cannula.  After administration of sedation and rectal exam, the patients rectum was intubated and the EC-[id number] ([id number])  colonoscope was advanced under direct visualization to the ileum.  The scope was removed slowly by carefully examining the color, texture, anatomy, and integrity mucosa on the way out.  The patient was recovered in endoscopy and discharged home in satisfactory condition.     COLON FINDINGS: The mucosa appeared normal in the terminal ileum.  , The colon was otherwise normal.  There was no diverticulosis, inflammation, polyps or cancers unless previously stated.  , and Moderate sized internal hemorrhoids were found.  PREP QUALITY: excellent. CECAL W/D TIME: 9 minutes COMPLICATIONS: None  ENDOSCOPIC IMPRESSION: 1.   Normal mucosa in the terminal ileum 2.   Moderate sized internal hemorrhoids  RECOMMENDATIONS: DRINK WATER TO KEEP URINE LIGHT YELLOW. FOLLOW A HIGH FIBER DIET.  SEE INFO BELOW. USE ANUSOL 2 TIMES A DAY FOR 12 DAYS TO RELIEVE HEMORRHOID /BLEEDING.  FOLLOW UP IN 6 WEEKS. CONSIDER [facility] BANDING  IF BRBPR NOT RESOLVED AND/OR CT SCAN IF ABD PAIN  CONTINUES. Next colonoscopy in 10 years.       _______________________________ Rosalie DoctorJonette Eva, MD 07/06/2012 1:48 PM
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
## 2012-07-09 NOTE — Progress Notes (Signed)
TCS MAR 2014 IH  REVIEWED.

## 2012-07-12 ENCOUNTER — Encounter (HOSPITAL_COMMUNITY): Payer: Self-pay | Admitting: Gastroenterology

## 2012-08-17 ENCOUNTER — Encounter: Payer: Self-pay | Admitting: Gastroenterology

## 2012-08-19 ENCOUNTER — Ambulatory Visit (INDEPENDENT_AMBULATORY_CARE_PROVIDER_SITE_OTHER): Payer: Self-pay | Admitting: Gastroenterology

## 2012-08-19 ENCOUNTER — Encounter: Payer: Self-pay | Admitting: Gastroenterology

## 2012-08-19 VITALS — BP 148/83 | HR 60 | Temp 98.2°F | Ht 75.0 in | Wt 217.8 lb

## 2012-08-19 DIAGNOSIS — K921 Melena: Secondary | ICD-10-CM

## 2012-08-19 NOTE — Progress Notes (Signed)
NO PCP ON FILE
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
## 2012-08-19 NOTE — Progress Notes (Signed)
Reminder in epic °

## 2012-08-19 NOTE — Patient Instructions (Signed)
CALL ME IF YOU WOULD LIKE TO CONSIDER [facility].  USE MIRALAX AS NEEDED FOR A GOOD BM.  FOLLOW A HIGH FIBER DIET. AVOID ITEMS THAT CAUSE BLOATING AND GAS. SEE INFO BELOW.  DRINK WATER TO KEEP HER URINE LIGHT YELLOW.  FOLLOW UP IN OCT 2014.  High-Fiber Diet A high-fiber diet changes your normal diet to include more whole grains, legumes, fruits, and vegetables. Changes in the diet involve replacing refined carbohydrates with unrefined foods. The calorie level of the diet is essentially unchanged. The Dietary Reference Intake (recommended amount) for adult males is 38 grams per day. For adult females, it is 25 grams per day. Pregnant and lactating women should consume 28 grams of fiber per day. Fiber is the intact part of a plant that is not broken down during digestion. Functional fiber is fiber that has been isolated from the plant to provide a beneficial effect in the body. PURPOSE  Increase stool bulk.   Ease and regulate bowel movements.   Lower cholesterol.  INDICATIONS THAT YOU NEED MORE FIBER  Constipation and hemorrhoids.   Uncomplicated diverticulosis (intestine condition) and irritable bowel syndrome.   Weight management.   As a protective measure against hardening of the arteries (atherosclerosis), diabetes, and cancer.   DO NOT USE WITH:  Acute diverticulitis (intestine infection).   Partial small bowel obstructions.   Complicated diverticular disease involving bleeding, rupture (perforation), or abscess (boil, furuncle).   Presence of autonomic neuropathy (nerve damage) or gastroparesis (stomach cannot empty itself).    GUIDELINES FOR INCREASING FIBER IN THE DIET  Start adding fiber to the diet slowly. A gradual increase of about 5 more grams (2 slices of whole-wheat bread, 2 servings of most fruits or vegetables, or 1 bowl of high-fiber cereal) per day is best. Too rapid an increase in fiber may result in constipation, flatulence, and bloating.   Drink  enough water and fluids to keep your urine clear or pale yellow. Water, juice, or caffeine-free drinks are recommended. Not drinking enough fluid may cause constipation.   Eat a variety of high-fiber foods rather than one type of fiber.   Try to increase your intake of fiber through using high-fiber foods rather than fiber pills or supplements that contain small amounts of fiber.   The goal is to change the types of food eaten. Do not supplement your present diet with high-fiber foods, but replace foods in your present diet.    INCLUDE A VARIETY OF FIBER SOURCES  Replace refined and processed grains with whole grains, canned fruits with fresh fruits, and incorporate other fiber sources. White rice, white breads, and most bakery goods contain little or no fiber.   Brown whole-grain rice, buckwheat oats, and many fruits and vegetables are all good sources of fiber. These include: broccoli, Brussels sprouts, cabbage, cauliflower, beets, sweet potatoes, white potatoes (skin on), carrots, tomatoes, eggplant, squash, berries, fresh fruits, and dried fruits.   Cereals appear to be the richest source of fiber. Cereal fiber is found in whole grains and bran. Bran is the fiber-rich outer coat of cereal grain, which is largely removed in refining. In whole-grain cereals, the bran remains. In breakfast cereals, the largest amount of fiber is found in those with "bran" in their names. The fiber content is sometimes indicated on the label.   You may need to include additional fruits and vegetables each day.   In baking, for 1 cup white flour, you may use the following substitutions:   1 cup whole-wheat flour  minus 2 tablespoons.   1/2 cup white flour plus 1/2 cup whole-wheat flour.   Hemorrhoids Hemorrhoids are dilated (enlarged) veins around the rectum. Sometimes clots will form in the veins. This makes them swollen and painful. These are called thrombosed hemorrhoids. Causes of hemorrhoids  include:  Constipation.   Straining to have a bowel movement.   HEAVY LIFTING  HOME CARE INSTRUCTIONS  Eat a well balanced diet and drink 6 to 8 glasses of water every day to avoid constipation. You may also use a bulk laxative.   Avoid straining to have bowel movements.   Keep anal area dry and clean.   Do not use a donut shaped pillow or sit on the toilet for long periods. This increases blood pooling and pain.   Move your bowels when your body has the urge; this will require less straining and will decrease pain and pressure.   DO NOT SIT ON THE COMMODE AND READ.

## 2012-08-19 NOTE — Assessment & Plan Note (Addendum)
USE PREP H PRN. DRINK WATER EAT FIBER AVOID CONSTIPATION AND STRAINING CALL ME IF HE WOULD LIKE TO CONSIDER [facility]. DISCUSSED HEMORRHOID BANDING OPTIONS-[facility] V. FLEX SIG, BENEFITS V. RISKS. OPV IN 6 MO.

## 2012-10-27 ENCOUNTER — Encounter (HOSPITAL_COMMUNITY): Payer: Self-pay | Admitting: *Deleted

## 2012-10-27 ENCOUNTER — Emergency Department (HOSPITAL_COMMUNITY)
Admission: EM | Admit: 2012-10-27 | Discharge: 2012-10-27 | Disposition: A | Discharge status: Home / Self Care | Payer: Self-pay | Attending: Emergency Medicine | Admitting: Emergency Medicine

## 2012-10-27 ENCOUNTER — Emergency Department (HOSPITAL_COMMUNITY): Payer: Self-pay

## 2012-10-27 DIAGNOSIS — M5030 Other cervical disc degeneration, unspecified cervical region: Secondary | ICD-10-CM | POA: Insufficient documentation

## 2012-10-27 DIAGNOSIS — M545 Low back pain, unspecified: Secondary | ICD-10-CM | POA: Insufficient documentation

## 2012-10-27 DIAGNOSIS — IMO0002 Reserved for concepts with insufficient information to code with codable children: Secondary | ICD-10-CM | POA: Insufficient documentation

## 2012-10-27 DIAGNOSIS — J40 Bronchitis, not specified as acute or chronic: Secondary | ICD-10-CM

## 2012-10-27 DIAGNOSIS — F17200 Nicotine dependence, unspecified, uncomplicated: Secondary | ICD-10-CM | POA: Insufficient documentation

## 2012-10-27 DIAGNOSIS — R059 Cough, unspecified: Secondary | ICD-10-CM | POA: Insufficient documentation

## 2012-10-27 DIAGNOSIS — Z87828 Personal history of other (healed) physical injury and trauma: Secondary | ICD-10-CM | POA: Insufficient documentation

## 2012-10-27 DIAGNOSIS — R6883 Chills (without fever): Secondary | ICD-10-CM | POA: Insufficient documentation

## 2012-10-27 DIAGNOSIS — M549 Dorsalgia, unspecified: Secondary | ICD-10-CM

## 2012-10-27 DIAGNOSIS — G8929 Other chronic pain: Secondary | ICD-10-CM | POA: Insufficient documentation

## 2012-10-27 DIAGNOSIS — R011 Cardiac murmur, unspecified: Secondary | ICD-10-CM | POA: Insufficient documentation

## 2012-10-27 DIAGNOSIS — J209 Acute bronchitis, unspecified: Secondary | ICD-10-CM | POA: Insufficient documentation

## 2012-10-27 DIAGNOSIS — R05 Cough: Secondary | ICD-10-CM | POA: Insufficient documentation

## 2012-10-27 MED ORDER — METHOCARBAMOL 500 MG PO TABS: 500.0000 mg | ORAL_TABLET | Freq: Three times a day (TID) | ORAL | Status: DC

## 2012-10-27 MED ORDER — HYDROCODONE-ACETAMINOPHEN 5-325 MG PO TABS
2.0000 | ORAL_TABLET | Freq: Once | ORAL | Status: AC
  Administered 2012-10-27: 2 via ORAL
  Filled 2012-10-27: qty 2

## 2012-10-27 MED ORDER — PROMETHAZINE HCL 12.5 MG PO TABS
12.5000 mg | ORAL_TABLET | Freq: Once | ORAL | Status: AC
  Administered 2012-10-27: 12.5 mg via ORAL
  Filled 2012-10-27: qty 1

## 2012-10-27 MED ORDER — DEXAMETHASONE 4 MG PO TABS: ORAL_TABLET | ORAL | Status: DC

## 2012-10-27 MED ORDER — CIPROFLOXACIN HCL 500 MG PO TABS: 500.0000 mg | ORAL_TABLET | Freq: Two times a day (BID) | ORAL | Status: DC

## 2012-10-27 MED ORDER — HYDROCODONE-ACETAMINOPHEN 5-325 MG PO TABS: ORAL_TABLET | ORAL | Status: DC

## 2012-10-27 MED ORDER — DEXAMETHASONE SODIUM PHOSPHATE 4 MG/ML IJ SOLN
8.0000 mg | Freq: Once | INTRAMUSCULAR | Status: AC
  Administered 2012-10-27: 8 mg via INTRAMUSCULAR
  Filled 2012-10-27: qty 2

## 2012-10-27 MED ORDER — METHOCARBAMOL 500 MG PO TABS
1000.0000 mg | ORAL_TABLET | Freq: Once | ORAL | Status: AC
  Administered 2012-10-27: 1000 mg via ORAL
  Filled 2012-10-27: qty 2

## 2012-10-27 NOTE — ED Provider Notes (Signed)
Medical screening examination/treatment/procedure(s) were performed by non-physician practitioner and as supervising physician I was immediately available for consultation/collaboration.    Sherod Cisse D Dacotah Cabello, MD 10/27/12 2320 
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
## 2012-10-27 NOTE — ED Notes (Signed)
Headache, neck pain, lt leg pain, for 3 days.  Has had lt leg pain for 3 years.

## 2013-01-15 ENCOUNTER — Emergency Department (HOSPITAL_COMMUNITY)
Admission: EM | Admit: 2013-01-15 | Discharge: 2013-01-15 | Disposition: A | Discharge status: Home / Self Care | Payer: BC Managed Care – PPO | Attending: Emergency Medicine | Admitting: Emergency Medicine

## 2013-01-15 ENCOUNTER — Encounter (HOSPITAL_COMMUNITY): Payer: Self-pay | Admitting: Emergency Medicine

## 2013-01-15 ENCOUNTER — Emergency Department (HOSPITAL_COMMUNITY): Payer: BC Managed Care – PPO

## 2013-01-15 DIAGNOSIS — Z792 Long term (current) use of antibiotics: Secondary | ICD-10-CM | POA: Insufficient documentation

## 2013-01-15 DIAGNOSIS — J209 Acute bronchitis, unspecified: Secondary | ICD-10-CM | POA: Insufficient documentation

## 2013-01-15 DIAGNOSIS — M549 Dorsalgia, unspecified: Secondary | ICD-10-CM | POA: Insufficient documentation

## 2013-01-15 DIAGNOSIS — Z79899 Other long term (current) drug therapy: Secondary | ICD-10-CM | POA: Insufficient documentation

## 2013-01-15 DIAGNOSIS — R509 Fever, unspecified: Secondary | ICD-10-CM | POA: Insufficient documentation

## 2013-01-15 DIAGNOSIS — J40 Bronchitis, not specified as acute or chronic: Secondary | ICD-10-CM

## 2013-01-15 DIAGNOSIS — F17200 Nicotine dependence, unspecified, uncomplicated: Secondary | ICD-10-CM | POA: Insufficient documentation

## 2013-01-15 DIAGNOSIS — R5381 Other malaise: Secondary | ICD-10-CM | POA: Insufficient documentation

## 2013-01-15 MED ORDER — AMOXICILLIN 500 MG PO CAPS: 500.0000 mg | ORAL_CAPSULE | Freq: Three times a day (TID) | ORAL | Status: DC

## 2013-01-15 MED ORDER — ALBUTEROL SULFATE HFA 108 (90 BASE) MCG/ACT IN AERS
2.0000 | INHALATION_SPRAY | RESPIRATORY_TRACT | Status: DC | PRN
  Administered 2013-01-15: 2 via RESPIRATORY_TRACT
  Filled 2013-01-15: qty 6.7

## 2013-01-15 NOTE — ED Notes (Signed)
Pt c/o cough, congestion and generalized body aches x3 days. Pt states cough is productive with green sputum.
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
## 2013-01-15 NOTE — ED Notes (Signed)
Respiratory called

## 2013-02-17 ENCOUNTER — Ambulatory Visit: Payer: Self-pay | Admitting: Gastroenterology

## 2013-02-22 ENCOUNTER — Encounter: Payer: Self-pay | Admitting: General Practice

## 2013-03-30 ENCOUNTER — Ambulatory Visit: Payer: Self-pay | Admitting: Gastroenterology

## 2013-03-31 ENCOUNTER — Other Ambulatory Visit: Payer: Self-pay | Admitting: Gastroenterology

## 2013-03-31 ENCOUNTER — Encounter: Payer: Self-pay | Admitting: Gastroenterology

## 2013-03-31 ENCOUNTER — Encounter (HOSPITAL_COMMUNITY): Payer: Self-pay | Admitting: Pharmacy Technician

## 2013-03-31 ENCOUNTER — Ambulatory Visit (INDEPENDENT_AMBULATORY_CARE_PROVIDER_SITE_OTHER): Payer: BC Managed Care – PPO | Admitting: Gastroenterology

## 2013-03-31 ENCOUNTER — Encounter (INDEPENDENT_AMBULATORY_CARE_PROVIDER_SITE_OTHER): Payer: Self-pay

## 2013-03-31 VITALS — BP 151/83 | HR 53 | Temp 97.2°F | Ht 75.0 in | Wt 216.6 lb

## 2013-03-31 DIAGNOSIS — R1319 Other dysphagia: Secondary | ICD-10-CM

## 2013-03-31 DIAGNOSIS — R195 Other fecal abnormalities: Secondary | ICD-10-CM

## 2013-03-31 DIAGNOSIS — K921 Melena: Secondary | ICD-10-CM

## 2013-03-31 DIAGNOSIS — R109 Unspecified abdominal pain: Secondary | ICD-10-CM

## 2013-03-31 DIAGNOSIS — R1010 Upper abdominal pain, unspecified: Secondary | ICD-10-CM | POA: Insufficient documentation

## 2013-03-31 MED ORDER — OMEPRAZOLE 20 MG PO CPDR: DELAYED_RELEASE_CAPSULE | ORAL | Status: DC

## 2013-03-31 NOTE — Assessment & Plan Note (Signed)
MOST LIKELY DUE TO FOOD INTOLERANCE, ? IBS, AND LESS LIKELY C DIFF OR PANCREATIC INSUIFFICIENCY.  C DIFF PCR TODAY.

## 2013-03-31 NOTE — Assessment & Plan Note (Signed)
RARE SX MOST LIKELY DUE TO HEMORRHOIDS  CONTINUE TO MONITOR SYMPTOMS.

## 2013-03-31 NOTE — Progress Notes (Signed)
No pcp
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
## 2013-03-31 NOTE — Patient Instructions (Addendum)
UPPER ENDOSCOPY ON DEC 12.  ADD DEXILANT THEN PRILOSEC DAILY TO TREAT ABDOMINAL PAIN.   FOLLOW A LOW FAT DIET. SEE INFO BELOW.  FOLLOW UP IN 4 MOS.    Low-Fat Diet BREADS, CEREALS, PASTA, RICE, DRIED PEAS, AND BEANS These products are high in carbohydrates and most are low in fat. Therefore, they can be increased in the diet as substitutes for fatty foods. They too, however, contain calories and should not be eaten in excess. Cereals can be eaten for snacks as well as for breakfast.   FRUITS AND VEGETABLES It is good to eat fruits and vegetables. Besides being sources of fiber, both are rich in vitamins and some minerals. They help you get the daily allowances of these nutrients. Fruits and vegetables can be used for snacks and desserts.  MEATS Limit lean meat, chicken, [place], and fish to no more than 6 ounces per day. Beef, Pork, and Lamb Use lean cuts of beef, pork, and lamb. Lean cuts include:  Extra-lean ground beef.  Arm roast.  Sirloin tip.  Center-cut ham.  Round steak.  Loin chops.  Rump roast.  Tenderloin.  Trim all fat off the outside of meats before cooking. It is not necessary to severely decrease the intake of red meat, but lean choices should be made. Lean meat is rich in protein and contains a highly absorbable form of iron. Premenopausal women, in particular, should avoid reducing lean red meat because this could increase the risk for low red blood cells (iron-deficiency anemia).  Chicken and [place] These are good sources of protein. The fat of poultry can be reduced by removing the skin and underlying fat layers before cooking. Chicken and [place] can be substituted for lean red meat in the diet. Poultry should not be fried or covered with high-fat sauces. Fish and Shellfish Fish is a good source of protein. Shellfish contain cholesterol, but they usually are low in saturated fatty acids. The preparation of fish is important. Like chicken and [place], they should not  be fried or covered with high-fat sauces. EGGS Egg whites contain no fat or cholesterol. They can be eaten often. Try 1 to 2 egg whites instead of whole eggs in recipes or use egg substitutes that do not contain yolk. MILK AND DAIRY PRODUCTS Use skim or 1% milk instead of 2% or whole milk. Decrease whole milk, natural, and processed cheeses. Use nonfat or low-fat (2%) cottage cheese or low-fat cheeses made from vegetable oils. Choose nonfat or low-fat (1 to 2%) yogurt. Experiment with evaporated skim milk in recipes that call for heavy cream. Substitute low-fat yogurt or low-fat cottage cheese for sour cream in dips and salad dressings. Have at least 2 servings of low-fat dairy products, such as 2 glasses of skim (or 1%) milk each day to help get your daily calcium intake. FATS AND OILS Reduce the total intake of fats, especially saturated fat. Butterfat, lard, and beef fats are high in saturated fat and cholesterol. These should be avoided as much as possible. Vegetable fats do not contain cholesterol, but certain vegetable fats, such as coconut oil, palm oil, and palm kernel oil are very high in saturated fats. These should be limited. These fats are often used in bakery goods, processed foods, popcorn, oils, and nondairy creamers. Vegetable shortenings and some peanut butters contain hydrogenated oils, which are also saturated fats. Read the labels on these foods and check for saturated vegetable oils. Unsaturated vegetable oils and fats do not raise blood cholesterol. However, they  should be limited because they are fats and are high in calories. Total fat should still be limited to 30% of your daily caloric intake. Desirable liquid vegetable oils are corn oil, cottonseed oil, olive oil, canola oil, safflower oil, soybean oil, and sunflower oil. Peanut oil is not as good, but small amounts are acceptable. Buy a heart-healthy tub margarine that has no partially hydrogenated oils in the ingredients.  Mayonnaise and salad dressings often are made from unsaturated fats, but they should also be limited because of their high calorie and fat content. Seeds, nuts, peanut butter, olives, and avocados are high in fat, but the fat is mainly the unsaturated type. These foods should be limited mainly to avoid excess calories and fat. OTHER EATING TIPS Snacks  Most sweets should be limited as snacks. They tend to be rich in calories and fats, and their caloric content outweighs their nutritional value. Some good choices in snacks are graham crackers, melba toast, soda crackers, bagels (no egg), English muffins, fruits, and vegetables. These snacks are preferable to snack crackers, [place] fries, TORTILLA CHIPS, and POTATO chips. Popcorn should be air-popped or cooked in small amounts of liquid vegetable oil. Desserts Eat fruit, low-fat yogurt, and fruit ices instead of pastries, cake, and cookies. Sherbet, angel food cake, gelatin dessert, frozen low-fat yogurt, or other frozen products that do not contain saturated fat (pure fruit juice bars, frozen ice pops) are also acceptable.  COOKING METHODS Choose those methods that use little or no fat. They include: Poaching.  Braising.  Steaming.  Grilling.  Baking.  Stir-frying.  Broiling.  Microwaving.  Foods can be cooked in a nonstick pan without added fat, or use a nonfat cooking spray in regular cookware. Limit fried foods and avoid frying in saturated fat. Add moisture to lean meats by using water, broth, cooking wines, and other nonfat or low-fat sauces along with the cooking methods mentioned above. Soups and stews should be chilled after cooking. The fat that forms on top after a few hours in the refrigerator should be skimmed off. When preparing meals, avoid using excess salt. Salt can contribute to raising blood pressure in some people.  EATING AWAY FROM HOME Order entres, potatoes, and vegetables without sauces or butter. When meat exceeds the  size of a deck of cards (3 to 4 ounces), the rest can be taken home for another meal. Choose vegetable or fruit salads and ask for low-calorie salad dressings to be served on the side. Use dressings sparingly. Limit high-fat toppings, such as bacon, crumbled eggs, cheese, sunflower seeds, and olives. Ask for heart-healthy tub margarine instead of butter.

## 2013-03-31 NOTE — Assessment & Plan Note (Addendum)
SX MOST LIKELY DUE TO H PYLORI GASTRITIS, LESS LIKELY PUD OR GASTRIC CA. WEIGHT STABLE.  EGD DEC 12-PHENERGAN 25 MG IV IN PREOP. IF no H PYLORI, PT WILL NEED CT SCAN. ADD DAILY PPI. DEXILANT #5 GIVEN. OPV IN 4 MOS

## 2013-04-01 LAB — CLOSTRIDIUM DIFFICILE BY PCR: Toxigenic C. Difficile by PCR: NOT DETECTED

## 2013-04-04 NOTE — Progress Notes (Signed)
Reminder in epic °

## 2013-04-08 ENCOUNTER — Ambulatory Visit (HOSPITAL_COMMUNITY)
Admission: RE | Admit: 2013-04-08 | Discharge: 2013-04-08 | Disposition: A | Discharge status: Home / Self Care | Payer: BC Managed Care – PPO | Source: Ambulatory Visit | Attending: Gastroenterology | Admitting: Gastroenterology

## 2013-04-08 ENCOUNTER — Encounter (HOSPITAL_COMMUNITY): Payer: Self-pay | Admitting: *Deleted

## 2013-04-08 ENCOUNTER — Encounter (HOSPITAL_COMMUNITY)
Admission: RE | Disposition: A | Discharge status: Home / Self Care | Payer: Self-pay | Source: Ambulatory Visit | Attending: Gastroenterology

## 2013-04-08 DIAGNOSIS — K2980 Duodenitis without bleeding: Secondary | ICD-10-CM

## 2013-04-08 DIAGNOSIS — K259 Gastric ulcer, unspecified as acute or chronic, without hemorrhage or perforation: Secondary | ICD-10-CM

## 2013-04-08 DIAGNOSIS — R1319 Other dysphagia: Secondary | ICD-10-CM

## 2013-04-08 DIAGNOSIS — K3189 Other diseases of stomach and duodenum: Secondary | ICD-10-CM | POA: Insufficient documentation

## 2013-04-08 DIAGNOSIS — K2940 Chronic atrophic gastritis without bleeding: Secondary | ICD-10-CM | POA: Insufficient documentation

## 2013-04-08 DIAGNOSIS — R1013 Epigastric pain: Secondary | ICD-10-CM

## 2013-04-08 DIAGNOSIS — A048 Other specified bacterial intestinal infections: Secondary | ICD-10-CM | POA: Insufficient documentation

## 2013-04-08 HISTORY — PX: ESOPHAGOGASTRODUODENOSCOPY: SHX5428

## 2013-04-08 HISTORY — DX: Gastro-esophageal reflux disease without esophagitis: K21.9

## 2013-04-08 SURGERY — EGD (ESOPHAGOGASTRODUODENOSCOPY)
Anesthesia: Moderate Sedation

## 2013-04-08 MED ORDER — MIDAZOLAM HCL 5 MG/5ML IJ SOLN
INTRAMUSCULAR | Status: AC
  Filled 2013-04-08: qty 10

## 2013-04-08 MED ORDER — MEPERIDINE HCL 100 MG/ML IJ SOLN
INTRAMUSCULAR | Status: AC
  Filled 2013-04-08: qty 2

## 2013-04-08 MED ORDER — PROMETHAZINE HCL 25 MG/ML IJ SOLN
INTRAMUSCULAR | Status: AC
  Filled 2013-04-08: qty 1

## 2013-04-08 MED ORDER — PROMETHAZINE HCL 25 MG/ML IJ SOLN
25.0000 mg | Freq: Once | INTRAMUSCULAR | Status: AC
  Administered 2013-04-08: 25 mg via INTRAVENOUS

## 2013-04-08 MED ORDER — MEPERIDINE HCL 100 MG/ML IJ SOLN
INTRAMUSCULAR | Status: DC | PRN
  Administered 2013-04-08: 25 mg via INTRAVENOUS
  Administered 2013-04-08: 50 mg via INTRAVENOUS

## 2013-04-08 MED ORDER — OMEPRAZOLE 20 MG PO CPDR: DELAYED_RELEASE_CAPSULE | ORAL | Status: DC

## 2013-04-08 MED ORDER — BUTAMBEN-TETRACAINE-BENZOCAINE 2-2-14 % EX AERO
INHALATION_SPRAY | CUTANEOUS | Status: DC | PRN
  Administered 2013-04-08: 2 via TOPICAL

## 2013-04-08 MED ORDER — STERILE WATER FOR IRRIGATION IR SOLN
Status: DC | PRN
  Administered 2013-04-08: 12:00:00

## 2013-04-08 MED ORDER — SODIUM CHLORIDE 0.9 % IV SOLN
INTRAVENOUS | Status: DC
  Administered 2013-04-08: 1000 mL via INTRAVENOUS

## 2013-04-08 MED ORDER — MIDAZOLAM HCL 5 MG/5ML IJ SOLN
INTRAMUSCULAR | Status: DC | PRN
  Administered 2013-04-08 (×2): 2 mg via INTRAVENOUS
  Administered 2013-04-08: 1 mg via INTRAVENOUS

## 2013-04-08 MED ORDER — DEXLANSOPRAZOLE 60 MG PO CPDR: 60.0000 mg | DELAYED_RELEASE_CAPSULE | Freq: Every day | ORAL | Status: DC

## 2013-04-08 MED ORDER — SODIUM CHLORIDE 0.9 % IJ SOLN
INTRAMUSCULAR | Status: AC
  Filled 2013-04-08: qty 10
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
## 2013-04-08 NOTE — Interval H&P Note (Signed)
History and Physical Interval Note:  04/08/2013 11:34 AM  Stephen Koch  has presented today for surgery, with the diagnosis of DYSPHAGIA  The various methods of treatment have been discussed with the patient and family. After consideration of risks, benefits and other options for treatment, the patient has consented to  Procedure(s) with comments: ESOPHAGOGASTRODUODENOSCOPY (EGD) (N/A) - 11:30 as a surgical intervention .  The patient's history has been reviewed, patient examined, no change in status, stable for surgery.  I have reviewed the patient's chart and labs.  Questions were answered to the patient's satisfaction.     [employer]

## 2013-04-08 NOTE — Op Note (Signed)
[facility] [address] [place], [address]   ENDOSCOPY PROCEDURE REPORT  PATIENT: Stephen Koch, Stephen Koch  MR#: [id number] BIRTHDATE: May 13, 1960 , 52  yrs. old GENDER: Male  ENDOSCOPIST: Jonette Eva, MD REFERRED [id number] Muse, PA  PROCEDURE DATE: 04/08/2013 PROCEDURE:   EGD w/ biopsy  INDICATIONS:Dyspepsia.   Epigastric pain. MEDICATIONS: PREOP: Promethazine (Phenergan) 25mg  IV, Demerol 75 mg IV, and Versed 5 mg IV TOPICAL ANESTHETIC:   Cetacaine Spray  DESCRIPTION OF PROCEDURE:     Physical exam was performed.  Informed consent was obtained from the patient after explaining the benefits, risks, and alternatives to the procedure.  The patient was connected to the monitor and placed in the left lateral position.  Continuous oxygen was provided by nasal cannula and IV medicine administered through an indwelling cannula.  After administration of sedation, the patients esophagus was intubated and the EG-[id number] ([id number])  endoscope was advanced under direct visualization to the second portion of the duodenum.  The scope was removed slowly by carefully examining the color, texture, anatomy, and integrity of the mucosa on the way out.  The patient was recovered in endoscopy and discharged home in satisfactory condition.      ESOPHAGUS: The mucosa of the esophagus appeared normal.  STOMACH: Small ulcer(s) were found in the gastric antrum.  Biopsies were taken around the ulcer.  DUODENUM: Mild duodenal inflammation was found in the duodenal bulb. The duodenal mucosa showed no abnormalities in the 2nd part of the duodenum. COMPLICATIONS:   None  ENDOSCOPIC IMPRESSION: 1.   The mucosa of the esophagus appeared normal 2.   Small ulcer(s) were found in the gastric antrum; biopsies 3.   Duodenal inflammation was found in the duodenal bulb 4.   The duodenal mucosa showed no abnormalities in the 2nd part of the duodenum  RECOMMENDATIONS: Omeprazole bid for 3  mos then once daily.  PT CAN'T AFFORD DEXILANT. NO ASPIRIN, BC, GOODYS, IBUPROFEN/MORTIN, OR NAPROXEN/ALEVE FOR 2 WEEKS.  TYLENOL AS NEEDED FOR PAIN. FOLLOW A LOW FAT DIET. BIOPSIES SHOULD BE BACK IN 7 DAYS. FOLLOW UP IN APR 2015.   REPEAT EXAM:   _______________________________ Rosalie DoctorJonette Eva, MD 04/08/2013 5:31 PM       PATIENT NAME:  Stephen Koch MR#: [id number]

## 2013-04-12 ENCOUNTER — Encounter (HOSPITAL_COMMUNITY): Payer: Self-pay | Admitting: Gastroenterology

## 2013-04-14 ENCOUNTER — Telehealth: Payer: Self-pay

## 2013-04-14 DIAGNOSIS — R1013 Epigastric pain: Secondary | ICD-10-CM

## 2013-04-14 MED ORDER — OMEPRAZOLE 20 MG PO CPDR: DELAYED_RELEASE_CAPSULE | ORAL | Status: DC

## 2013-04-14 MED ORDER — RANITIDINE HCL 300 MG PO TABS: ORAL_TABLET | ORAL | Status: DC

## 2013-04-14 NOTE — Telephone Encounter (Signed)
Please call pt. His stomach Bx shows gastritis. The omeprazole is the cheapest acid blocker available. HE MAY TRY ZANTAC 300 MG QHS AT BEDTIME FOR 3 MOS.  HE NEEDS A CT OF HIS ABDOMEN W/ IV AND ORAL CONTRAST TO COMPLETE THE EVALUATION FOR HIS UPPER ABDOMINAL PAIN. CONTINUE PRILOSEC OR ZANTAC FOR 3 MOS. NO ASPIRIN, BC, GOODYS, IBUPROFEN/MORTIN, OR NAPROXEN/ALEVE UNTIL DEC 29. TYLENOL AS NEEDED FOR PAIN. FOLLOW A LOW FAT DIET.  BIOPSIES SHOULD BE BACK IN 7 DAYS.  FOLLOW UP IN APR 2015.

## 2013-04-14 NOTE — Telephone Encounter (Signed)
Pt called to let us know he can not afford the medication that was called in after his EGD Friday. Please advise

## 2013-04-14 NOTE — Telephone Encounter (Signed)
Pt is aware of results. He was able to get the Prilosec after all.  Leigh-Ann Can you please set him up for the CT scan of the abd.  Thanks Jaysten Essner

## 2013-04-15 NOTE — Telephone Encounter (Signed)
Patient is scheduled for CT on Tuesday Dec 23 at 8:45 and he is aware to go by and pick up oral contrast ahead of time

## 2013-04-18 NOTE — Telephone Encounter (Signed)
Reminder in epic °

## 2013-04-19 ENCOUNTER — Other Ambulatory Visit: Payer: Self-pay | Admitting: Gastroenterology

## 2013-04-19 ENCOUNTER — Ambulatory Visit (HOSPITAL_COMMUNITY)
Admission: RE | Admit: 2013-04-19 | Discharge: 2013-04-19 | Disposition: A | Discharge status: Home / Self Care | Payer: BC Managed Care – PPO | Source: Ambulatory Visit | Attending: Gastroenterology | Admitting: Gastroenterology

## 2013-04-19 DIAGNOSIS — N200 Calculus of kidney: Secondary | ICD-10-CM | POA: Insufficient documentation

## 2013-04-19 DIAGNOSIS — E278 Other specified disorders of adrenal gland: Secondary | ICD-10-CM | POA: Insufficient documentation

## 2013-04-19 DIAGNOSIS — K2980 Duodenitis without bleeding: Secondary | ICD-10-CM | POA: Insufficient documentation

## 2013-04-19 DIAGNOSIS — K921 Melena: Secondary | ICD-10-CM | POA: Insufficient documentation

## 2013-04-19 DIAGNOSIS — K769 Liver disease, unspecified: Secondary | ICD-10-CM

## 2013-04-19 DIAGNOSIS — R933 Abnormal findings on diagnostic imaging of other parts of digestive tract: Secondary | ICD-10-CM | POA: Insufficient documentation

## 2013-04-19 DIAGNOSIS — R1013 Epigastric pain: Secondary | ICD-10-CM | POA: Insufficient documentation

## 2013-04-19 MED ORDER — IOHEXOL 300 MG/ML  SOLN
100.0000 mL | Freq: Once | INTRAMUSCULAR | Status: AC | PRN
  Administered 2013-04-19: 100 mL via INTRAVENOUS

## 2013-04-19 NOTE — Telephone Encounter (Signed)
Called and informed pt.  

## 2013-04-19 NOTE — Telephone Encounter (Signed)
I PERSONALLY REVIEWED CT WITH DR. Tyron Russell. NO STENOSIS AT CELIAC, SMA, OR IMA.

## 2013-04-19 NOTE — Telephone Encounter (Signed)
PLEASE CALL PT. HIS CT SHOWS NO OBVIOUS REASON FOR HIS STOMACH PAIN. IT' S MOST LIKELY DUE TO EROSIVE GASTRITIS. HE NEEDS A MRI OF HIS ABD WITH CONTRAST DUE TO NONSPECIFIC LESIONS IN HIS LIVER FOUND ON HIS CT TO COMPLETE THE EVALUATION FOR HIS ABDOMINAL PAIN.

## 2013-04-19 NOTE — Telephone Encounter (Signed)
Patient is scheduled for MRI on Tues Jan 6th at 8:00 am NPO after midnight and patient is aware

## 2013-05-03 ENCOUNTER — Ambulatory Visit (HOSPITAL_COMMUNITY)
Admission: RE | Admit: 2013-05-03 | Discharge: 2013-05-03 | Disposition: A | Discharge status: Home / Self Care | Payer: BC Managed Care – PPO | Source: Ambulatory Visit | Attending: Gastroenterology | Admitting: Gastroenterology

## 2013-05-03 DIAGNOSIS — K769 Liver disease, unspecified: Secondary | ICD-10-CM

## 2013-05-03 DIAGNOSIS — R109 Unspecified abdominal pain: Secondary | ICD-10-CM | POA: Insufficient documentation

## 2013-05-03 DIAGNOSIS — K7689 Other specified diseases of liver: Secondary | ICD-10-CM | POA: Insufficient documentation

## 2013-05-03 MED ORDER — GADOBENATE DIMEGLUMINE 529 MG/ML IV SOLN
20.0000 mL | Freq: Once | INTRAVENOUS | Status: AC | PRN
  Administered 2013-05-03: 20 mL via INTRAVENOUS

## 2013-05-03 NOTE — Telephone Encounter (Signed)
Called and informed pt.  

## 2013-05-03 NOTE — Telephone Encounter (Signed)
PLEASE CALL PT. THE LESIONS IN HIS LIVER APPEAR TO BE BENIGN. HE SHOULD HAVE A REPEAT MRI IN ONE YEAR TO MAKE SURE THEY HAVE NOT CHANGED IN SIZE.

## 2013-05-11 ENCOUNTER — Other Ambulatory Visit: Payer: Self-pay | Admitting: Physical Medicine and Rehabilitation

## 2013-05-11 DIAGNOSIS — M545 Low back pain, unspecified: Secondary | ICD-10-CM

## 2013-05-16 ENCOUNTER — Ambulatory Visit (HOSPITAL_COMMUNITY): Payer: BC Managed Care – PPO | Attending: Physical Medicine and Rehabilitation

## 2013-08-10 ENCOUNTER — Ambulatory Visit (HOSPITAL_COMMUNITY)
Admission: RE | Admit: 2013-08-10 | Discharge: 2013-08-10 | Disposition: A | Discharge status: Home / Self Care | Payer: BC Managed Care – PPO | Source: Ambulatory Visit | Attending: Family Medicine | Admitting: Family Medicine

## 2013-08-10 DIAGNOSIS — IMO0001 Reserved for inherently not codable concepts without codable children: Secondary | ICD-10-CM | POA: Insufficient documentation

## 2013-08-10 DIAGNOSIS — M545 Low back pain, unspecified: Secondary | ICD-10-CM | POA: Insufficient documentation

## 2013-08-10 DIAGNOSIS — M549 Dorsalgia, unspecified: Secondary | ICD-10-CM | POA: Insufficient documentation

## 2013-08-10 DIAGNOSIS — R29898 Other symptoms and signs involving the musculoskeletal system: Secondary | ICD-10-CM | POA: Insufficient documentation

## 2013-08-10 DIAGNOSIS — R262 Difficulty in walking, not elsewhere classified: Secondary | ICD-10-CM | POA: Insufficient documentation

## 2013-08-10 DIAGNOSIS — M2560 Stiffness of unspecified joint, not elsewhere classified: Secondary | ICD-10-CM | POA: Insufficient documentation

## 2013-08-10 DIAGNOSIS — R200 Anesthesia of skin: Secondary | ICD-10-CM
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
## 2013-08-16 ENCOUNTER — Ambulatory Visit (HOSPITAL_COMMUNITY): Payer: Self-pay | Admitting: *Deleted

## 2013-08-18 ENCOUNTER — Ambulatory Visit (HOSPITAL_COMMUNITY)
Admission: RE | Admit: 2013-08-18 | Discharge: 2013-08-18 | Disposition: A | Discharge status: Home / Self Care | Payer: BC Managed Care – PPO | Source: Ambulatory Visit | Attending: Family Medicine | Admitting: Family Medicine

## 2013-08-18 DIAGNOSIS — R29898 Other symptoms and signs involving the musculoskeletal system: Secondary | ICD-10-CM

## 2013-08-18 DIAGNOSIS — M549 Dorsalgia, unspecified: Secondary | ICD-10-CM

## 2013-08-18 DIAGNOSIS — R200 Anesthesia of skin: Secondary | ICD-10-CM

## 2013-08-18 DIAGNOSIS — M2560 Stiffness of unspecified joint, not elsewhere classified: Secondary | ICD-10-CM

## 2013-08-18 NOTE — Progress Notes (Signed)
Physical Therapy Treatment Patient Details  Name: Stephen Koch MRN: [id number] Date of Birth: 1960-06-21  Today's Date: 08/18/2013 Time: 8144-8185 PT Time Calculation (min): 39 min Charge there ex x 39 Visit#: 2 of 8  Re-eval: 09/09/13    Authorization: BC[employer]  Subjective: Symptoms/Limitations Symptoms: Pt states that he just got up secondary to a cold.  States he has not completed the exercises secondary to his cold   Exercise/Treatments   Stretches Active Hamstring Stretch: 3 reps;30 seconds Lower Trunk Rotation: 5 reps    Standing Scapular Retraction: 10 reps;Theraband Theraband Level (Scapular Retraction): Level 3 (Green) Row: 10 reps;Theraband Theraband Level (Row): Level 3 (Green) Shoulder Extension: 10 reps;Theraband Other Standing Lumbar Exercises: at wall B UE flexion x 10 Other Standing Lumbar Exercises: body mechanics picking up box x 5    Supine Straight Leg Raise: 10 reps   Prone  Single Arm Raise: 10 reps Straight Leg Raise: 10 reps Opposite Arm/Leg Raise: 10 reps     Physical Therapy Assessment and Plan PT Assessment and Plan Clinical Impression Statement: Pt needing therapist facilitation throughout exercise program to maintain neural position.  Pt instructed in new standing and prone exercises as well as proper body mechanics while lifting. Rehab Potential: Good PT Frequency: Min 2X/week PT Duration: 4 weeks PT Treatment/Interventions: Patient/family education;Therapeutic activities;Functional mobility training PT Plan: Begin wall squats as well as double arm raise and dead bug     Goals  progressing  Problem List Patient Active Problem List   Diagnosis Date Noted  . Stiffness of joints, not elsewhere classified, multiple sites 08/10/2013  . Leg weakness, bilateral 08/10/2013  . Back pain associated with peripheral numbness 08/10/2013  . Loose stools 03/31/2013  . Upper abdominal pain 03/31/2013  . Hematochezia 07/01/2012       GP    Stephen Koch 08/18/2013, 4:49 PM

## 2013-08-19 ENCOUNTER — Ambulatory Visit (HOSPITAL_COMMUNITY)
Admission: RE | Admit: 2013-08-19 | Discharge: 2013-08-19 | Disposition: A | Discharge status: Home / Self Care | Payer: BC Managed Care – PPO | Source: Ambulatory Visit | Attending: Family Medicine | Admitting: Family Medicine

## 2013-08-19 DIAGNOSIS — M2560 Stiffness of unspecified joint, not elsewhere classified: Secondary | ICD-10-CM

## 2013-08-19 DIAGNOSIS — R29898 Other symptoms and signs involving the musculoskeletal system: Secondary | ICD-10-CM

## 2013-08-19 DIAGNOSIS — M549 Dorsalgia, unspecified: Secondary | ICD-10-CM

## 2013-08-19 DIAGNOSIS — R200 Anesthesia of skin: Secondary | ICD-10-CM

## 2013-08-19 NOTE — Progress Notes (Signed)
Physical Therapy Treatment Patient Details  Name: Stephen Koch MRN: [id number] Date of Birth: 15-May-1960  Today's Date: 08/19/2013 Time: 1110-1155 PT Time Calculation (min): 45 min Charge:  There ex x 43 Visit#: 3 of 8  Re-eval: 09/09/13    Authorization: BCBS    Subjective: Symptoms/Limitations Symptoms: Pt states he did some of the exercises yesterday.  Pt is sore today. Pain Assessment Currently in Pain?: Yes Pain Score: 7  Pain Location: Back Pain Orientation: Left    Exercise/Treatments     Stretches Active Hamstring Stretch: 3 reps;30 seconds Single Knee to Chest Stretch: 3 reps;30 seconds Lower Trunk Rotation: 5 reps Press Ups: 5 reps Piriformis Stretch: 3 reps;30 seconds    Machines for Strengthening Cybex Lumbar Extension: 2 Pl x 10 Standing Scapular Retraction: 10 reps;Theraband Theraband Level (Scapular Retraction): Level 3 (Green) Row: 10 reps;Theraband Theraband Level (Row): Level 3 (Green) Shoulder Extension: 10 reps;Theraband Other Standing Lumbar Exercises: at wall B UE flexion x 10 Other Standing Lumbar Exercises: wall push up x 10   Supine Bridge: 10 reps Straight Leg Raise: 10 reps Prone  Single Arm Raise: 10 reps Straight Leg Raise: 10 reps Opposite Arm/Leg Raise: 10 reps      Physical Therapy Assessment and Plan PT Assessment and Plan Clinical Impression Statement: Pt has better form with all exercises today.  Instructed in stretches to address radicular sx.  All exercises facillitated by therapist. Rehab Potential: Good PT Plan: Begin wall squats as well as double arm raise and dead bug  these exercises were not added secondary to complaint of soreness     Goals  progressing  Problem List Patient Active Problem List   Diagnosis Date Noted  . Stiffness of joints, not elsewhere classified, multiple sites 08/10/2013  . Leg weakness, bilateral 08/10/2013  . Back pain associated with peripheral numbness 08/10/2013  . Loose  stools 03/31/2013  . Upper abdominal pain 03/31/2013  . Hematochezia 07/01/2012       GP    [name] 08/19/2013, 11:54 AM

## 2013-08-23 ENCOUNTER — Ambulatory Visit (HOSPITAL_COMMUNITY)
Admission: RE | Admit: 2013-08-23 | Discharge: 2013-08-23 | Disposition: A | Discharge status: Home / Self Care | Payer: BC Managed Care – PPO | Source: Ambulatory Visit | Attending: Family Medicine | Admitting: Family Medicine

## 2013-08-23 NOTE — Progress Notes (Signed)
Physical Therapy Treatment Patient Details  Name: Stephen Koch MRN: [id number] Date of Birth: 06/28/1960  Today's Date: 08/23/2013 Time: 8182-9937 PT Time Calculation (min): 37 min Charges: Therex x 37' [phone])  Visit#: 4 of 8  Re-eval: 09/09/13    Authorization: BCBS  Authorization Visit#: 4 of 8   Subjective: Symptoms/Limitations Symptoms: Pt states that his back feels tight and pain felt in his lower back and L leg is a nagging pain. Pt also reports that the top of his L foot is numb. Pain Assessment Currently in Pain?: Yes Pain Score: 6  Pain Location: Back Pain Orientation: Lower Multiple Pain Sites: Yes   Exercise/Treatments  Stretches Active Hamstring Stretch: 2 reps;30 seconds Single Knee to Chest Stretch: 2 reps;30 seconds Lower Trunk Rotation: Limitations Lower Trunk Rotation Limitations: 10 reps Press Ups: Limitations Press Ups Limitations: 10 reps Piriformis Stretch: 2 reps;30 seconds Machines for Strengthening Cybex Lumbar Extension: 2 Pl x 10 Standing Scapular Retraction: 10 reps;Theraband Theraband Level (Scapular Retraction): Level 3 (Green) Row: 10 reps;Theraband Theraband Level (Row): Level 3 (Green) Supine Dead Bug: 10 reps Bridge: 10 reps Straight Leg Raise: 10 reps Prone  Opposite Arm/Leg Raise: 10 reps;Right arm/Left leg;Left arm/Right leg  Physical Therapy Assessment and Plan PT Assessment and Plan Clinical Impression Statement: Continued lumbar strengthening and stretching exercises per PT POC to increase functional strength and decrease pain. Began dead bugs to increase lumbar stabilization. Pt tolerated therex well after intial multimodal cueing and demo. Pt continued to rate pain as 6/10 in low back and tightness after therex was completed. Pt may benefit from manual  techniques to low back to decrease tightness and pain. This entire session was guided, instructed, and directly supervised by Rachelle Hora, PTA. Rehab Potential:  Good PT Plan: Continue strengthening, stretching exercises per PT POC. Continue dead bugs next session. Begin wall squats with B UE flexion as warranted. Begin manual techniques to low back to decrease tightness next session.     Problem List Patient Active Problem List   Diagnosis Date Noted  . Stiffness of joints, not elsewhere classified, multiple sites 08/10/2013  . Leg weakness, bilateral 08/10/2013  . Back pain associated with peripheral numbness 08/10/2013  . Loose stools 03/31/2013  . Upper abdominal pain 03/31/2013  . Hematochezia 07/01/2012    PT - End of Session Activity Tolerance: Patient tolerated treatment well General Behavior During Therapy: [facility] for tasks assessed/performed   Ahmed Prima, SPTA 08/23/2013, 2:40 PM

## 2013-08-25 ENCOUNTER — Ambulatory Visit (HOSPITAL_COMMUNITY)
Admission: RE | Admit: 2013-08-25 | Discharge: 2013-08-25 | Disposition: A | Discharge status: Home / Self Care | Payer: BC Managed Care – PPO | Source: Ambulatory Visit | Attending: Family Medicine | Admitting: Family Medicine

## 2013-08-25 DIAGNOSIS — R200 Anesthesia of skin: Secondary | ICD-10-CM

## 2013-08-25 DIAGNOSIS — M2560 Stiffness of unspecified joint, not elsewhere classified: Secondary | ICD-10-CM

## 2013-08-25 DIAGNOSIS — M549 Dorsalgia, unspecified: Secondary | ICD-10-CM

## 2013-08-25 DIAGNOSIS — R29898 Other symptoms and signs involving the musculoskeletal system: Secondary | ICD-10-CM

## 2013-08-25 NOTE — Progress Notes (Signed)
Physical Therapy Treatment Patient Details  Name: Stephen Koch MRN: [id number] Date of Birth: 08-31-1960  Today's Date: 08/25/2013 Time: 9892-1194 PT Time Calculation (min): 9 min Charge: TE 1740-8144, Manual 8185-6314  Visit#: 5 of 8  Re-eval: 09/09/13 Assessment Diagnosis: low back pain  Authorization: BCBS  Authorization Time Period:    Authorization Visit#: 5 of 8   Subjective: Symptoms/Limitations Symptoms: Pt stated LBP 7/10 with radicular symptoms to dorsal aspect Lt LE. Pain Assessment Currently in Pain?: Yes Pain Score: 7  Pain Location: Back Pain Orientation: Lower Pain Radiating Towards: Lt LE  Objective   Exercise/Treatments Standing Scapular Retraction: 10 reps;Theraband Theraband Level (Scapular Retraction): Level 3 (Green) Row: 10 reps;Theraband Theraband Level (Row): Level 3 (Green) Shoulder Extension: 10 reps;Theraband Theraband Level (Shoulder Extension): Level 3 (Green) Other Standing Lumbar Exercises: Posture awareness infront of mirrow  Manual Therapy Manual Therapy: Other (comment) Massage: scapular, thoraic and lumbar region, Quadratus lumborum and compression to Bil gluteal Other Manual Therapy: Muscle energy technique for Lt inflare f/b PFC   Physical Therapy Assessment and Plan PT Assessment and Plan Clinical Impression Statement: Muscle energy technique complete this session to improve sacroiliac alignment and massage to reduce spasms and overall tightness of back musculature.  Pt educated on importance of posture and  core musculature (PFC) to keep SI within alignment.  Pt reported relief from tension following manual techniques though continues to have radicular symptoms down Lt LE.   PT Plan: Next session check SI, MET PRN. Continue piriformis, hamstrings and ITB.  Continue core strengthening exercises, PFC, bridges, dead bugs and clams.  Progressed to wall squats wtih Bil UE flexion as able.  Manual techniques to low back to reduce  tension, may beneift from nerve glides.    Goals Home Exercise Program Pt/caregiver will Perform Home Exercise Program: For increased strengthening PT Short Term Goals Time to Complete Short Term Goals: 2 weeks PT Short Term Goal 1: Pt to be able to sit for 20 minutes without any increased pain. PT Short Term Goal 2: Pt to be able to verbalize the importance of posture in controling back pain PT Short Term Goal 2 - Progress: Progressing toward goal PT Short Term Goal 3: Pt to be able to verbalize the importance of body mechanics in back pain. PT Long Term Goals Time to Complete Long Term Goals: 4 weeks PT Long Term Goal 1: Pt pain level to be no greater than a 4 75% of the day PT Long Term Goal 2: Pt to be able to sit for 30 minutes without increased pain to be able to enjoy a meal. Long Term Goal 3: PT to be able to walk for 30 minutes for better health habits. Long Term Goal 4: Pt to be I in advance HEP PT Long Term Goal 5: Pt strength of LE to be increased by one grade to allow the above to occur  Problem List Patient Active Problem List   Diagnosis Date Noted  . Stiffness of joints, not elsewhere classified, multiple sites 08/10/2013  . Leg weakness, bilateral 08/10/2013  . Back pain associated with peripheral numbness 08/10/2013  . Loose stools 03/31/2013  . Upper abdominal pain 03/31/2013  . Hematochezia 07/01/2012    PT - End of Session Activity Tolerance: Patient tolerated treatment well;Patient limited by pain General Behavior During Therapy: [facility] for tasks assessed/performed  GP    Aldona Lento 08/25/2013, 4:57 PM

## 2013-08-30 ENCOUNTER — Ambulatory Visit (HOSPITAL_COMMUNITY): Payer: Self-pay | Admitting: Physical Therapy

## 2013-09-01 ENCOUNTER — Ambulatory Visit (HOSPITAL_COMMUNITY)
Admission: RE | Admit: 2013-09-01 | Discharge: 2013-09-01 | Disposition: A | Discharge status: Home / Self Care | Payer: BC Managed Care – PPO | Source: Ambulatory Visit | Attending: Family Medicine | Admitting: Family Medicine

## 2013-09-01 DIAGNOSIS — M545 Low back pain, unspecified: Secondary | ICD-10-CM | POA: Insufficient documentation

## 2013-09-01 DIAGNOSIS — R200 Anesthesia of skin: Secondary | ICD-10-CM

## 2013-09-01 DIAGNOSIS — M2560 Stiffness of unspecified joint, not elsewhere classified: Secondary | ICD-10-CM

## 2013-09-01 DIAGNOSIS — R29898 Other symptoms and signs involving the musculoskeletal system: Secondary | ICD-10-CM | POA: Insufficient documentation

## 2013-09-01 DIAGNOSIS — R262 Difficulty in walking, not elsewhere classified: Secondary | ICD-10-CM | POA: Insufficient documentation

## 2013-09-01 DIAGNOSIS — M549 Dorsalgia, unspecified: Secondary | ICD-10-CM

## 2013-09-01 DIAGNOSIS — IMO0001 Reserved for inherently not codable concepts without codable children: Secondary | ICD-10-CM | POA: Insufficient documentation

## 2013-09-01 NOTE — Progress Notes (Signed)
Physical Therapy Treatment Patient Details  Name: Stephen Koch MRN: [id number] Date of Birth: 21-Sep-1960  Today's Date: 09/01/2013 Time: 8676-1950 PT Time Calculation (min): 43 min Charge: there ex 9326-7124  Visit#: 6 of 8  Re-eval: 09/09/13   Authorization: BCBS  Authorization Visit#: 6 of 8   Subjective: Symptoms/Limitations Symptoms: Pt states his pain is at a 7/10     Exercise/Treatments Stretches Passive Hamstring Stretch: 2 reps;30 seconds (3 way with LE on 14" box) Standing Side Bend: 5 reps;Limitations Standing Side Bend Limitations: combo with body mechanics. pick up blue ball lift overhead  side bend RT then Lt then down. Quadruped Mid Back Stretch: 3 reps;20 seconds Prone Mid Back Stretch:  (quadriped mad cat x 10) Aerobic Stationary Bike: nustep L 4 x 12'   Standing Scapular Retraction: 10 reps;Theraband Theraband Level (Scapular Retraction): Level 3 (Green) Row: 10 reps;Theraband Theraband Level (Row): Level 3 (Green) Shoulder Extension: 10 reps;Theraband Theraband Level (Shoulder Extension): Level 3 (Green) Seated Long Arc Quad on [place]: Both;10 reps Hip Flexion on Ball: Both;10 reps   Quadruped Single Arm Raise: 10 reps Straight Leg Raise: 10 reps Opposite Arm/Leg Raise: 10 reps     Physical Therapy Assessment and Plan PT Assessment and Plan Clinical Impression Statement: Added stretches to address tight paraspinal mm; pt given t-band to complete postural ex at home as he now has good form. Began quadriped strengthening exercises.  Pt needed min assist to complete exercises on ball Rehab Potential: Good PT Frequency: Min 2X/week PT Duration: 4 weeks PT Plan: continute with body mechanics and strengthening    Goals    Problem List Patient Active Problem List   Diagnosis Date Noted  . Stiffness of joints, not elsewhere classified, multiple sites 08/10/2013  . Leg weakness, bilateral 08/10/2013  . Back pain associated with peripheral  numbness 08/10/2013  . Loose stools 03/31/2013  . Upper abdominal pain 03/31/2013  . Hematochezia 07/01/2012    PT - End of Session Activity Tolerance: Patient tolerated treatment well;Patient limited by pain General Behavior During Therapy: [facility] for tasks assessed/performed  GP    [name] 09/01/2013, 3:29 PM

## 2013-09-05 ENCOUNTER — Ambulatory Visit (HOSPITAL_COMMUNITY): Payer: Self-pay | Admitting: *Deleted

## 2013-09-07 ENCOUNTER — Ambulatory Visit (HOSPITAL_COMMUNITY)
Admission: RE | Admit: 2013-09-07 | Discharge: 2013-09-07 | Disposition: A | Discharge status: Home / Self Care | Payer: BC Managed Care – PPO | Source: Ambulatory Visit | Attending: Family Medicine | Admitting: Family Medicine
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
## 2013-11-04 ENCOUNTER — Ambulatory Visit (INDEPENDENT_AMBULATORY_CARE_PROVIDER_SITE_OTHER): Payer: BC Managed Care – PPO | Admitting: Cardiology

## 2013-11-04 ENCOUNTER — Encounter: Payer: Self-pay | Admitting: Cardiology

## 2013-11-04 VITALS — BP 138/92 | HR 62 | Ht 75.0 in | Wt 200.0 lb

## 2013-11-04 DIAGNOSIS — I10 Essential (primary) hypertension: Secondary | ICD-10-CM | POA: Insufficient documentation

## 2013-11-04 DIAGNOSIS — R011 Cardiac murmur, unspecified: Secondary | ICD-10-CM | POA: Insufficient documentation
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
## 2013-11-04 NOTE — Patient Instructions (Signed)
Your physician has requested that you have an echocardiogram. Echocardiography is a painless test that uses sound waves to create images of your heart. It provides your doctor with information about the size and shape of your heart and how well your heart's chambers and valves are working. This procedure takes approximately one hour. There are no restrictions for this procedure.   Your physician recommends that you continue on your current medications as directed. Please refer to the Current Medication list given to you today.  We we call you with results of your test and discuss follow up appointment.  Thank you for choosing [facility]!!

## 2013-11-04 NOTE — Assessment & Plan Note (Signed)
Currently on ACE inhibitor. I reinforced compliance with his medications, sodium restriction, and followup with Dr. Berdine Addison.

## 2013-11-04 NOTE — Assessment & Plan Note (Signed)
As outlined above, not clearly symptomatic. He does have LVH changes by ECG and presumably long-standing hypertension that has not been optimally and control over time. Could be related to LVH with some associated aortic valve sclerosis. Echocardiogram will be obtained to exclude any significant valvular abnormalities that need closer attention, and also rule out hypertrophic cardiomyopathy.

## 2013-11-07 ENCOUNTER — Ambulatory Visit (HOSPITAL_COMMUNITY)
Admission: RE | Admit: 2013-11-07 | Discharge: 2013-11-07 | Disposition: A | Discharge status: Home / Self Care | Payer: BC Managed Care – PPO | Source: Ambulatory Visit | Attending: Cardiology | Admitting: Cardiology

## 2013-11-07 DIAGNOSIS — F17200 Nicotine dependence, unspecified, uncomplicated: Secondary | ICD-10-CM | POA: Insufficient documentation

## 2013-11-07 DIAGNOSIS — E785 Hyperlipidemia, unspecified: Secondary | ICD-10-CM | POA: Insufficient documentation

## 2013-11-07 DIAGNOSIS — I359 Nonrheumatic aortic valve disorder, unspecified: Secondary | ICD-10-CM

## 2013-11-07 DIAGNOSIS — R011 Cardiac murmur, unspecified: Secondary | ICD-10-CM

## 2013-11-07 DIAGNOSIS — I080 Rheumatic disorders of both mitral and aortic valves: Secondary | ICD-10-CM | POA: Insufficient documentation

## 2013-11-07 DIAGNOSIS — I10 Essential (primary) hypertension: Secondary | ICD-10-CM | POA: Insufficient documentation

## 2013-11-07 NOTE — Progress Notes (Signed)
  Echocardiogram 2D Echocardiogram has been performed.  [place], Hanson 11/07/2013, 3:01 PM

## 2013-11-08 ENCOUNTER — Telehealth: Payer: Self-pay

## 2013-11-08 NOTE — Telephone Encounter (Signed)
Message copied by Bernita Raisin on Tue Nov 08, 2013  8:14 AM ------      Message from: Satira Sark      Created: Tue Nov 08, 2013  7:42 AM       Reviewed report. LV systolic function is normal. He has moderate LVH which would be consistent with long-standing hypertension. Aortic valve is also mildly stenotic which most likely explains his cardiac murmur. He has moderate aortic regurgitation as well. These are asymptomatic findings at this point and can be followed over time. We can see him back in one year. ------

## 2013-11-08 NOTE — Telephone Encounter (Signed)
Discussed echo results with patient,copy to pcp

## 2014-04-12 ENCOUNTER — Other Ambulatory Visit: Payer: Self-pay

## 2014-04-12 MED ORDER — OMEPRAZOLE 20 MG PO CPDR: 20.0000 mg | DELAYED_RELEASE_CAPSULE | Freq: Every day | ORAL | Status: DC

## 2014-11-16 DIAGNOSIS — I10 Essential (primary) hypertension: Secondary | ICD-10-CM | POA: Diagnosis not present

## 2014-11-16 DIAGNOSIS — E785 Hyperlipidemia, unspecified: Secondary | ICD-10-CM | POA: Diagnosis not present

## 2014-12-06 ENCOUNTER — Telehealth: Payer: Self-pay | Admitting: Gastroenterology

## 2014-12-06 NOTE — Telephone Encounter (Signed)
It appears that pt was not on recall for repeat MRI.   His last one was on 04/2013.    Does he need this done?  Please advise

## 2014-12-06 NOTE — Telephone Encounter (Signed)
Patient came to front window asking about paying what he owes and handed me 3 insurance cards. I made copies of the card to enter into his chart and gave him the number to the billing office to check on his balance if there was any.  He asked about being scheduled for a procedure to check on "a spot" that was found last year. He had a MRI done in 2015 and was to repeat it in one year. He had other errands to run and asked if someone would call him to set the MRI up. [phone]

## 2014-12-07 ENCOUNTER — Other Ambulatory Visit: Payer: Self-pay

## 2014-12-07 DIAGNOSIS — K769 Liver disease, unspecified: Secondary | ICD-10-CM

## 2014-12-07 NOTE — Telephone Encounter (Signed)
Spoke with pt. He is set for MRI on 12/20/2014.

## 2014-12-07 NOTE — Telephone Encounter (Signed)
PT NEED MRI ABD W/ AND W/O CONTRAST, dX: 2 T2 hyperintense, T1 hypo intense lesions within the liver.

## 2014-12-20 ENCOUNTER — Ambulatory Visit (HOSPITAL_COMMUNITY): Admission: RE | Admit: 2014-12-20 | Payer: Medicare Other | Source: Ambulatory Visit

## 2015-01-30 ENCOUNTER — Emergency Department (HOSPITAL_COMMUNITY)
Admission: EM | Admit: 2015-01-30 | Discharge: 2015-01-30 | Disposition: A | Discharge status: Home / Self Care | Payer: Medicare Other | Attending: Emergency Medicine | Admitting: Emergency Medicine

## 2015-01-30 ENCOUNTER — Encounter (HOSPITAL_COMMUNITY): Payer: Self-pay | Admitting: Emergency Medicine

## 2015-01-30 DIAGNOSIS — Z9119 Patient's noncompliance with other medical treatment and regimen: Secondary | ICD-10-CM | POA: Diagnosis not present

## 2015-01-30 DIAGNOSIS — Y9389 Activity, other specified: Secondary | ICD-10-CM | POA: Insufficient documentation

## 2015-01-30 DIAGNOSIS — I10 Essential (primary) hypertension: Secondary | ICD-10-CM | POA: Diagnosis not present

## 2015-01-30 DIAGNOSIS — Z79899 Other long term (current) drug therapy: Secondary | ICD-10-CM | POA: Insufficient documentation

## 2015-01-30 DIAGNOSIS — Z720 Tobacco use: Secondary | ICD-10-CM | POA: Insufficient documentation

## 2015-01-30 DIAGNOSIS — G8929 Other chronic pain: Secondary | ICD-10-CM | POA: Insufficient documentation

## 2015-01-30 DIAGNOSIS — W540XXA Bitten by dog, initial encounter: Secondary | ICD-10-CM | POA: Diagnosis not present

## 2015-01-30 DIAGNOSIS — Z23 Encounter for immunization: Secondary | ICD-10-CM | POA: Diagnosis not present

## 2015-01-30 DIAGNOSIS — S81851A Open bite, right lower leg, initial encounter: Secondary | ICD-10-CM | POA: Insufficient documentation

## 2015-01-30 DIAGNOSIS — Y998 Other external cause status: Secondary | ICD-10-CM | POA: Insufficient documentation

## 2015-01-30 DIAGNOSIS — Y9289 Other specified places as the place of occurrence of the external cause: Secondary | ICD-10-CM | POA: Insufficient documentation

## 2015-01-30 DIAGNOSIS — S81831A Puncture wound without foreign body, right lower leg, initial encounter: Secondary | ICD-10-CM | POA: Diagnosis not present

## 2015-01-30 MED ORDER — AMOXICILLIN-POT CLAVULANATE 875-125 MG PO TABS: 1.0000 | ORAL_TABLET | Freq: Two times a day (BID) | ORAL | Status: DC

## 2015-01-30 MED ORDER — AMOXICILLIN-POT CLAVULANATE 875-125 MG PO TABS
1.0000 | ORAL_TABLET | Freq: Once | ORAL | Status: AC
  Administered 2015-01-30: 1 via ORAL
  Filled 2015-01-30: qty 1

## 2015-01-30 MED ORDER — TETANUS-DIPHTH-ACELL PERTUSSIS 5-2.5-18.5 LF-MCG/0.5 IM SUSP
0.5000 mL | Freq: Once | INTRAMUSCULAR | Status: AC
  Administered 2015-01-30: 0.5 mL via INTRAMUSCULAR
  Filled 2015-01-30: qty 0.5

## 2015-01-30 NOTE — ED Notes (Signed)
Pt c/o of dog bite to RT lateral lower leg. Pt states that dog was up to date on its vaccinations per police. No bleeding noted.

## 2015-01-30 NOTE — Discharge Instructions (Signed)

## 2015-01-30 NOTE — ED Provider Notes (Signed)
CSN: [id number]     Arrival date & time 01/30/15  1557 History   First MD Initiated Contact with Patient 01/30/15 1617     Chief Complaint  Patient presents with  . Animal Bite     (Consider location/radiation/quality/duration/timing/severity/associated sxs/prior Treatment) Patient is a 54 y.o. male presenting with animal bite. The history is provided by the patient.  Animal Bite Contact animal:  Dog (Pitbull) Location:  Leg Leg injury location:  R lower leg Time since incident:  2 hours Pain details:    Quality:  Aching and sore   Severity:  Mild   Timing:  Constant   Progression:  Unchanged Incident location:  Outside and home Provoked: unprovoked   Notifications:  Animal control Animal's rabies vaccination status:  Up to date Animal in possession: yes   Tetanus status:  Up to date Relieved by:  None tried Ineffective treatments:  None tried  Stephen Koch is a 54 y.o. male who presents to the ED with a dog bite to the right lower leg. Patient states he was outside and getting his weed eater out and the neighbor's dog ran into his yard and attacked him. He has had issues the the neighbor's dogs in the past but this is the first time one actually bit him. Animal control has the dog and will observe for the next 10 days.   Past Medical History  Diagnosis Date  . Chronic back pain     Forklift injury  . GERD (gastroesophageal reflux disease)   . Essential hypertension, benign   . History of medication noncompliance    Past Surgical History  Procedure Laterality Date  . Right arm surgery    . Colonoscopy N/A 07/06/2012    [id number] mucosa in the terminal ileum/Moderate sized internal hemorrhoids  . Esophagogastroduodenoscopy N/A 04/08/2013    Procedure: ESOPHAGOGASTRODUODENOSCOPY (EGD);  Surgeon: Danie Binder, MD;  Location: [facility];  Service: Endoscopy;  Laterality: N/A;  11:30  . Left knee surgery     Family History  Problem Relation Age of Onset  . Heart  disease Father    Social History  Substance Use Topics  . Smoking status: Current Every Day Smoker -- 0.50 packs/day for 40 years    Types: Cigarettes  . Smokeless tobacco: None     Comment: Smokes about 4 cigarettes daily  . Alcohol Use: Yes     Comment: 12 pk beer per month    Review of Systems  Skin: Positive for wound.       Animal bite  all other systems negative    Allergies  Review of patient's allergies indicates no known allergies.  Home Medications   Prior to Admission medications   Medication Sig Start Date End Date Taking? Authorizing Provider  amoxicillin-clavulanate (AUGMENTIN) 875-125 MG tablet Take 1 tablet by mouth every 12 (twelve) hours. 01/30/15   Hope Bunnie Pion, NP  atorvastatin (LIPITOR) 20 MG tablet Take 20 mg by mouth at bedtime.  10/24/13   Historical Provider, MD  lisinopril (PRINIVIL,ZESTRIL) 5 MG tablet Take 5 mg by mouth daily.  10/24/13   Historical Provider, MD  methocarbamol (ROBAXIN) 500 MG tablet Take 500 mg by mouth 2 (two) times daily.    Historical Provider, MD  naproxen (NAPROSYN) 500 MG tablet 500 mg 2 (two) times daily. 10/31/13   Historical Provider, MD  omeprazole (PRILOSEC) 20 MG capsule 1 po bid 30 minutes before meals for 3 mos then once daily FOREVER 04/14/13   Sandi L  Fields, MD  omeprazole (PRILOSEC) 20 MG capsule Take 1 capsule (20 mg total) by mouth daily. 04/12/14   Carlis Stable, NP  oxyCODONE-acetaminophen (PERCOCET/ROXICET) 5-325 MG per tablet Take 1 tablet by mouth every 4 (four) hours as needed for severe pain.    Historical Provider, MD   BP 159/93 mmHg  Pulse 75  Temp(Src) 98.5 F (36.9 C) (Oral)  Resp 16  Ht 6\' 3"  (1.905 m)  Wt 210 lb (95.255 kg)  BMI 26.25 kg/m2  SpO2 97% Physical Exam  Constitutional: He is oriented to person, place, and time. He appears well-developed and well-nourished.  HENT:  Head: Normocephalic and atraumatic.  Eyes: Conjunctivae and EOM are normal.  Neck: Neck supple.  Cardiovascular: Normal  rate.   Pulmonary/Chest: Effort normal.  Musculoskeletal: Normal range of motion.  Right lower leg with puncture wounds to the lateral aspect due to dog bite. No bleeding at this time.  Neurological: He is alert and oriented to person, place, and time. He has normal strength. No cranial nerve deficit or sensory deficit. Gait normal.  Skin: Skin is warm and dry.  Psychiatric: He has a normal mood and affect. His behavior is normal.  Nursing note and vitals reviewed.   ED Course  Procedures  Wound care, Augmentin 875/125 PO MDM  54 y.o. male with dog bite to the right lower leg stable for d/c without focal neuro deficits. Will start Augmentin and he will take Tylenol or ibuprofen as needed for discomfort. Discussed with the patient and all questioned fully answered. He will return if any problems arise.   Final diagnoses:  Dog bite of right lower leg, initial encounter       [facility], NP 01/30/15 [place], MD 01/31/15 [phone]

## 2015-02-05 DIAGNOSIS — E785 Hyperlipidemia, unspecified: Secondary | ICD-10-CM | POA: Diagnosis not present

## 2015-02-05 DIAGNOSIS — I10 Essential (primary) hypertension: Secondary | ICD-10-CM | POA: Diagnosis not present

## 2015-02-05 DIAGNOSIS — Z6827 Body mass index (BMI) 27.0-27.9, adult: Secondary | ICD-10-CM | POA: Diagnosis not present

## 2015-02-08 DIAGNOSIS — M545 Low back pain: Secondary | ICD-10-CM | POA: Diagnosis not present

## 2015-02-08 DIAGNOSIS — Z79891 Long term (current) use of opiate analgesic: Secondary | ICD-10-CM | POA: Diagnosis not present

## 2015-02-08 DIAGNOSIS — M79605 Pain in left leg: Secondary | ICD-10-CM | POA: Diagnosis not present

## 2015-02-08 DIAGNOSIS — M79652 Pain in left thigh: Secondary | ICD-10-CM | POA: Diagnosis not present

## 2015-02-08 DIAGNOSIS — M79662 Pain in left lower leg: Secondary | ICD-10-CM | POA: Diagnosis not present

## 2015-02-08 DIAGNOSIS — M542 Cervicalgia: Secondary | ICD-10-CM | POA: Diagnosis not present

## 2015-02-08 DIAGNOSIS — G894 Chronic pain syndrome: Secondary | ICD-10-CM | POA: Diagnosis not present

## 2015-03-02 DIAGNOSIS — H40013 Open angle with borderline findings, low risk, bilateral: Secondary | ICD-10-CM | POA: Diagnosis not present

## 2015-03-29 DIAGNOSIS — M79605 Pain in left leg: Secondary | ICD-10-CM | POA: Diagnosis not present

## 2015-03-29 DIAGNOSIS — M542 Cervicalgia: Secondary | ICD-10-CM | POA: Diagnosis not present

## 2015-03-29 DIAGNOSIS — G894 Chronic pain syndrome: Secondary | ICD-10-CM | POA: Diagnosis not present

## 2015-03-29 DIAGNOSIS — M545 Low back pain: Secondary | ICD-10-CM | POA: Diagnosis not present

## 2015-03-29 DIAGNOSIS — Z79891 Long term (current) use of opiate analgesic: Secondary | ICD-10-CM | POA: Diagnosis not present

## 2015-04-06 ENCOUNTER — Emergency Department (HOSPITAL_COMMUNITY)
Admission: EM | Admit: 2015-04-06 | Discharge: 2015-04-06 | Disposition: A | Discharge status: Home / Self Care | Payer: Medicare Other | Attending: Emergency Medicine | Admitting: Emergency Medicine

## 2015-04-06 ENCOUNTER — Encounter (HOSPITAL_COMMUNITY): Payer: Self-pay | Admitting: Emergency Medicine

## 2015-04-06 DIAGNOSIS — K644 Residual hemorrhoidal skin tags: Secondary | ICD-10-CM | POA: Insufficient documentation

## 2015-04-06 DIAGNOSIS — Z9119 Patient's noncompliance with other medical treatment and regimen: Secondary | ICD-10-CM | POA: Insufficient documentation

## 2015-04-06 DIAGNOSIS — K625 Hemorrhage of anus and rectum: Secondary | ICD-10-CM | POA: Insufficient documentation

## 2015-04-06 DIAGNOSIS — Z791 Long term (current) use of non-steroidal anti-inflammatories (NSAID): Secondary | ICD-10-CM | POA: Diagnosis not present

## 2015-04-06 DIAGNOSIS — F17210 Nicotine dependence, cigarettes, uncomplicated: Secondary | ICD-10-CM | POA: Insufficient documentation

## 2015-04-06 DIAGNOSIS — I10 Essential (primary) hypertension: Secondary | ICD-10-CM | POA: Diagnosis not present

## 2015-04-06 DIAGNOSIS — Z79899 Other long term (current) drug therapy: Secondary | ICD-10-CM | POA: Insufficient documentation

## 2015-04-06 DIAGNOSIS — G8929 Other chronic pain: Secondary | ICD-10-CM | POA: Diagnosis not present

## 2015-04-06 DIAGNOSIS — K219 Gastro-esophageal reflux disease without esophagitis: Secondary | ICD-10-CM | POA: Insufficient documentation

## 2015-04-06 LAB — BASIC METABOLIC PANEL
Anion gap: 8 (ref 5–15)
BUN: 22 mg/dL — ABNORMAL HIGH (ref 6–20)
CO2: 26 mmol/L (ref 22–32)
Calcium: 9.1 mg/dL (ref 8.9–10.3)
Chloride: 106 mmol/L (ref 101–111)
Creatinine, Ser: 0.96 mg/dL (ref 0.61–1.24)
GFR calc Af Amer: >60 mL/min (ref >60)
GFR calc non Af Amer: >60 mL/min (ref >60)
Glucose, Bld: 106 mg/dL — ABNORMAL HIGH (ref 65–99)
Potassium: 3.8 mmol/L (ref 3.5–5.1)
Sodium: 140 mmol/L (ref 135–145)

## 2015-04-06 LAB — CBC WITH DIFFERENTIAL/PLATELET
Basophils Absolute: 0 10*3/uL (ref 0.0–0.1)
Basophils Relative: 0 %
Eosinophils Absolute: 0.1 10*3/uL (ref 0.0–0.7)
Eosinophils Relative: 1 %
HCT: 46.2 % (ref 39.0–52.0)
Hemoglobin: 16.3 g/dL (ref 13.0–17.0)
Lymphocytes Relative: 45 %
Lymphs Abs: 3 10*3/uL (ref 0.7–4.0)
MCH: 32.1 pg (ref 26.0–34.0)
MCHC: 35.3 g/dL (ref 30.0–36.0)
MCV: 90.9 fL (ref 78.0–100.0)
Monocytes Absolute: 0.8 10*3/uL (ref 0.1–1.0)
Monocytes Relative: 12 %
Neutro Abs: 2.7 10*3/uL (ref 1.7–7.7)
Neutrophils Relative %: 41 %
Platelets: 249 10*3/uL (ref 150–400)
RBC: 5.08 MIL/uL (ref 4.22–5.81)
RDW: 13.2 % (ref 11.5–15.5)
WBC: 6.5 10*3/uL (ref 4.0–10.5)

## 2015-04-06 MED ORDER — DICYCLOMINE HCL 10 MG PO CAPS
10.0000 mg | ORAL_CAPSULE | Freq: Once | ORAL | Status: AC
Start: 2015-04-06 — End: 2015-04-06
  Administered 2015-04-06: 10 mg via ORAL
  Filled 2015-04-06: qty 1

## 2015-04-06 MED ORDER — MORPHINE SULFATE (PF) 4 MG/ML IV SOLN
8.0000 mg | Freq: Once | INTRAVENOUS | Status: AC
  Administered 2015-04-06: 8 mg via INTRAVENOUS
  Filled 2015-04-06: qty 2

## 2015-04-06 NOTE — ED Provider Notes (Signed)
CSN: [id number]     Arrival date & time 04/06/15  1706 History   First MD Initiated Contact with Patient 04/06/15 1822     Chief Complaint  Patient presents with  . Rectal Bleeding     (Consider location/radiation/quality/duration/timing/severity/associated sxs/prior Treatment) HPI   24 show male with rectal bleeding. Onset about a week ago. Patient reports initially bright red blood mixed with stool was sent subsequently, dark red. Patient has noticed blood consistently with each bowel movement. No rectal pain. Is having some mild, crampy and diffuse abdominal pain. No dizziness, lightheadedness or shortness of breath. No blood thinners. Patient does report having a colonoscopy approximately 1 year ago after having rectal bleeding. Unremarkable as far he is aware.  Past Medical History  Diagnosis Date  . Chronic back pain     Forklift injury  . GERD (gastroesophageal reflux disease)   . Essential hypertension, benign   . History of medication noncompliance    Past Surgical History  Procedure Laterality Date  . Right arm surgery    . Colonoscopy N/A 07/06/2012    [id number] mucosa in the terminal ileum/Moderate sized internal hemorrhoids  . Esophagogastroduodenoscopy N/A 04/08/2013    Procedure: ESOPHAGOGASTRODUODENOSCOPY (EGD);  Surgeon: Danie Binder, MD;  Location: [facility];  Service: Endoscopy;  Laterality: N/A;  11:30  . Left knee surgery     Family History  Problem Relation Age of Onset  . Heart disease Father    Social History  Substance Use Topics  . Smoking status: Current Every Day Smoker -- 0.50 packs/day for 40 years    Types: Cigarettes  . Smokeless tobacco: None     Comment: Smokes about 4 cigarettes daily  . Alcohol Use: Yes     Comment: 12 pk beer per month    Review of Systems  All systems reviewed and negative, other than as noted in HPI.   Allergies  Ibuprofen  Home Medications   Prior to Admission medications   Medication Sig Start Date  End Date Taking? Authorizing Provider  amoxicillin-clavulanate (AUGMENTIN) 875-125 MG tablet Take 1 tablet by mouth every 12 (twelve) hours. 01/30/15   Hope Bunnie Pion, NP  atorvastatin (LIPITOR) 20 MG tablet Take 20 mg by mouth at bedtime.  10/24/13   Historical Provider, MD  lisinopril (PRINIVIL,ZESTRIL) 5 MG tablet Take 5 mg by mouth daily.  10/24/13   Historical Provider, MD  methocarbamol (ROBAXIN) 500 MG tablet Take 500 mg by mouth 2 (two) times daily.    Historical Provider, MD  naproxen (NAPROSYN) 500 MG tablet 500 mg 2 (two) times daily. 10/31/13   Historical Provider, MD  omeprazole (PRILOSEC) 20 MG capsule 1 po bid 30 minutes before meals for 3 mos then once daily FOREVER 04/14/13   Danie Binder, MD  omeprazole (PRILOSEC) 20 MG capsule Take 1 capsule (20 mg total) by mouth daily. 04/12/14   Carlis Stable, NP  oxyCODONE-acetaminophen (PERCOCET/ROXICET) 5-325 MG per tablet Take 1 tablet by mouth every 4 (four) hours as needed for severe pain.    Historical Provider, MD   BP 164/76 mmHg  Pulse 63  Temp(Src) 98.3 F (36.8 C) (Oral)  Resp 16  Ht 6\' 3"  (1.905 m)  Wt 210 lb (95.255 kg)  BMI 26.25 kg/m2  SpO2 100% Physical Exam  Constitutional: He appears well-developed and well-nourished. No distress.  HENT:  Head: Normocephalic and atraumatic.  Eyes: Conjunctivae are normal. Right eye exhibits no discharge. Left eye exhibits no discharge.  Neck: Neck supple.  Cardiovascular: Normal rate, regular rhythm and normal heart sounds.  Exam reveals no gallop and no friction rub.   No murmur heard. Pulmonary/Chest: Effort normal and breath sounds normal. No respiratory distress.  Abdominal: Soft. He exhibits no distension. There is tenderness.  Minimal diffuse tenderness.  Genitourinary:  Non bleeding external hemorrhoid. Normal tone. Brown stool. Heme occult negative.  Musculoskeletal: He exhibits no edema or tenderness.  Neurological: He is alert.  Skin: Skin is warm and dry.  Psychiatric:  He has a normal mood and affect. His behavior is normal. Thought content normal.  Nursing note and vitals reviewed.   ED Course  Procedures (including critical care time) Labs Review Labs Reviewed  BASIC METABOLIC PANEL - Abnormal; Notable for the following:    Glucose, Bld 106 (*)    BUN 22 (*)    All other components within normal limits  CBC WITH DIFFERENTIAL/PLATELET    Imaging Review No results found. I have personally reviewed and evaluated these images and lab results as part of my medical decision-making.   EKG Interpretation None      MDM   Final diagnoses:  Rectal bleeding    63y male with rectal bleeding. Stool today is brown and heme-negative. He is not anemic. Hemodynamically stable. No blood thinners. It has been determined that no acute conditions requiring further emergency intervention are present at this time. The patient has been advised of the diagnosis and plan. I reviewed any labs and imaging including any potential incidental findings. We have discussed signs and symptoms that warrant return to the ED and they are listed in the discharge instructions.      Virgel Manifold, MD 04/09/15 [phone]

## 2015-04-06 NOTE — Discharge Instructions (Signed)

## 2015-04-06 NOTE — ED Notes (Signed)
Pt reports rectal bleeding x 1 week. Pt states it started out bright red but is now dark with blood clots.

## 2015-04-09 LAB — POC OCCULT BLOOD, ED: Fecal Occult Bld: NEGATIVE

## 2015-04-18 ENCOUNTER — Ambulatory Visit (INDEPENDENT_AMBULATORY_CARE_PROVIDER_SITE_OTHER): Payer: Medicare Other | Admitting: Gastroenterology

## 2015-04-18 ENCOUNTER — Other Ambulatory Visit: Payer: Self-pay

## 2015-04-18 ENCOUNTER — Encounter: Payer: Self-pay | Admitting: Gastroenterology

## 2015-04-18 VITALS — BP 136/80 | HR 72 | Temp 98.2°F | Ht 75.0 in | Wt 206.6 lb

## 2015-04-18 DIAGNOSIS — B9681 Helicobacter pylori [H. pylori] as the cause of diseases classified elsewhere: Secondary | ICD-10-CM | POA: Insufficient documentation

## 2015-04-18 DIAGNOSIS — K7689 Other specified diseases of liver: Secondary | ICD-10-CM

## 2015-04-18 DIAGNOSIS — R1010 Upper abdominal pain, unspecified: Secondary | ICD-10-CM | POA: Diagnosis not present

## 2015-04-18 DIAGNOSIS — K921 Melena: Secondary | ICD-10-CM

## 2015-04-18 DIAGNOSIS — K2970 Gastritis, unspecified, without bleeding: Secondary | ICD-10-CM

## 2015-04-18 DIAGNOSIS — F1010 Alcohol abuse, uncomplicated: Secondary | ICD-10-CM | POA: Insufficient documentation

## 2015-04-18 DIAGNOSIS — K769 Liver disease, unspecified: Secondary | ICD-10-CM

## 2015-04-18 MED ORDER — AMOXICILLIN 500 MG PO CAPS: 1000.0000 mg | ORAL_CAPSULE | Freq: Two times a day (BID) | ORAL | Status: DC

## 2015-04-18 MED ORDER — OMEPRAZOLE 20 MG PO CPDR: 20.0000 mg | DELAYED_RELEASE_CAPSULE | Freq: Two times a day (BID) | ORAL | Status: DC

## 2015-04-18 MED ORDER — HYDROCORTISONE 2.5 % RE CREA: 1.0000 "application " | TOPICAL_CREAM | Freq: Two times a day (BID) | RECTAL | Status: DC

## 2015-04-18 MED ORDER — CLARITHROMYCIN 500 MG PO TABS: 500.0000 mg | ORAL_TABLET | Freq: Two times a day (BID) | ORAL | Status: DC

## 2015-04-18 NOTE — Assessment & Plan Note (Signed)
Omeprazole 20 mg twice a day, amoxicillin 1 g twice a day, Biaxin 500 mg twice a day for 14 days.

## 2015-04-18 NOTE — Addendum Note (Signed)
Addended by: Mahala Menghini on: 04/18/2015 12:22 PM   Modules accepted: Orders

## 2015-04-18 NOTE — Patient Instructions (Addendum)
1. You need to be treated for H pylori gastritis. Regimen consist of omeprazole 20 mg twice daily, amoxicillin 2 pills twice daily, Biaxin one pill twice daily all for 14 days. Hold Lipitor while on this regimen. He will also take omeprazole twice daily while on this treatment, once complete go back to once daily dosing. 2. MRI of the liver scheduled. 3. Please have your labs done. 4. Based on findings, we will decide if you need any further endoscopic evaluation.

## 2015-04-18 NOTE — Progress Notes (Signed)
CC'ED TO PCP 

## 2015-04-18 NOTE — Assessment & Plan Note (Signed)
Likely due to hemorrhoids. Colonoscopy 2 years ago. Hemoglobin stable. Treat with topical therapy. Consider hemorrhoid banding however patient states symptoms are infrequent except for the last few weeks. To discuss further with Dr. Oneida Alar, may consider repeat endoscopy for ongoing rectal bleeding.

## 2015-04-18 NOTE — Progress Notes (Signed)
Primary Care Physician: Maggie Font, MD  Primary Gastroenterologist:  Barney Drain, MD   Chief Complaint  Patient presents with  . Follow-up  . Rectal Bleeding  . Abdominal Pain    HPI: Stephen Koch is a 54 y.o. male here for further evaluation of abdominal pain and rectal bleeding. Patient last seen in December 2014. EGD done at that time revealing small ulcer found in the gastric antrum, mild duodenal inflammation. 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 pylori. I do not see documentation that he was treated for this. Last colonoscopy March 2014, moderate sized internal hemorrhoids. Seen recently in the emergency department for abdominal pain and rectal bleeding. DRE with nonbleeding external hemorrhoid noted. Brown stool heme-negative. Hemoglobin 16.3.  Patient states he has constant chronic upper abdominal pain. Does not seem to be aggravated by meals. Happens without any particular activities. Nothing makes better. Some nausea but no vomiting. Used to have a bowel movement about every other day, currently about twice per week. Describes [place] stool #4, no straining. 6 weeks history of intermittent hematochezia. Initially bright red to dark red moderate amount on the toilet tissue, lately more mild. Denies rectal pain or itching. Complains of a lot of gas. Heartburn well-controlled. Is on naproxen 500 mg twice a day again. No other NSAIDs or aspirin.  History of 3 lesions in the liver, overdue for surveillance MRI.    Current Outpatient Prescriptions  Medication Sig Dispense Refill  . atorvastatin (LIPITOR) 20 MG tablet Take 20 mg by mouth at bedtime.     . gabapentin (NEURONTIN) 300 MG capsule Take 300 mg by mouth daily.     . methocarbamol (ROBAXIN) 500 MG tablet Take 500 mg by mouth 2 (two) times daily.    . naproxen (NAPROSYN) 500 MG tablet Take 500 mg by mouth 2 (two) times daily.     Marland Kitchen omeprazole (PRILOSEC) 20 MG  capsule Take 1 capsule (20 mg total) by mouth daily. 30 capsule 11   No current facility-administered medications for this visit.    Allergies as of 04/18/2015 - Review Complete 04/18/2015  Allergen Reaction Noted  . Ibuprofen Other (See Comments) 04/06/2015   Past Medical History  Diagnosis Date  . Chronic back pain     Forklift injury  . GERD (gastroesophageal reflux disease)   . Essential hypertension, benign   . History of medication noncompliance    Past Surgical History  Procedure Laterality Date  . Right arm surgery    . Colonoscopy N/A 07/06/2012    [id number] mucosa in the terminal ileum/Moderate sized internal hemorrhoids  . Esophagogastroduodenoscopy N/A 04/08/2013    [id number] gastric ulcer/duodenal inflammation. 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 Left knee surgery     Family History  Problem Relation Age of Onset  . Heart disease Father    Social History   Social History  . Marital Status: Single    Spouse Name: N/A  . Number of Children: 4  . Years of Education: N/A   Occupational History  . Unemployed; previously warehouse work    Social History Main Topics  . Smoking status: Current Every Day Smoker -- 0.50 packs/day for 40 years    Types: Cigarettes  . Smokeless tobacco: None     Comment: Smokes about 4 cigarettes daily  . Alcohol Use: 0.0 oz/week    0 Standard drinks or equivalent per week  Comment: 12 pk beer within a day, not every day.   . Drug Use: Yes    Special: Marijuana     Comment: Marijuana twice per week  . Sexual Activity: Not Asked   Other Topics Concern  . None   Social History Narrative   Lives w/ grandma    ROS:  General: Negative for anorexia, weight loss, fever, chills, fatigue, weakness. ENT: Negative for hoarseness, difficulty swallowing , nasal congestion. CV: Negative for chest pain, angina, palpitations, dyspnea on exertion, peripheral edema.    Respiratory: Negative for dyspnea at rest, dyspnea on exertion, cough, sputum, wheezing.  GI: See history of present illness. GU:  Negative for dysuria, hematuria, urinary incontinence, urinary frequency, nocturnal urination.  Endo: Negative for unusual weight change.    Physical Examination:   BP 136/80 mmHg  Pulse 72  Temp(Src) 98.2 F (36.8 C)  Ht 6\' 3"  (1.905 m)  Wt 206 lb 9.6 oz (93.713 kg)  BMI 25.82 kg/m2  General: Well-nourished, well-developed in no acute distress.  Eyes: No icterus. Mouth: Oropharyngeal mucosa moist and pink , no lesions erythema or exudate. Lungs: Clear to auscultation bilaterally.  Heart: Regular rate and rhythm, no murmurs rubs or gallops.  Abdomen: Bowel sounds are normal, mild epigastric tenderness , nondistended, no hepatosplenomegaly or masses, no abdominal bruits or hernia , no rebound or guarding.   Extremities: No lower extremity edema. No clubbing or deformities. Neuro: Alert and oriented x 4   Skin: Warm and dry, no jaundice.   Psych: Alert and cooperative, normal mood and affect.  Labs:  Lab Results  Component Value Date   WBC 6.5 04/06/2015   HGB 16.3 04/06/2015   HCT 46.2 04/06/2015   MCV 90.9 04/06/2015   PLT 249 04/06/2015   Lab Results  Component Value Date   CREATININE 0.96 04/06/2015   BUN 22* 04/06/2015   NA 140 04/06/2015   K 3.8 04/06/2015   CL 106 04/06/2015   CO2 26 04/06/2015    Imaging Studies: No results found.

## 2015-04-18 NOTE — Progress Notes (Signed)
Please let patient know that [facility] agrees with plan as outlined today.  Treat H. Pylori. Labs as planned. Topical management of hemorrhoids. No need for colonoscopy at this point. She wants patient to follow-up with her March 2017. We'll decide at that time if flexible sigmoidoscopy with internal hemorrhoid banding and EGD needed. He can call sooner if needed.

## 2015-04-18 NOTE — Assessment & Plan Note (Signed)
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 Treat H. pylori. Check labs including lipase, LFTs, CBC. Further recommendations to follow.

## 2015-04-18 NOTE — Assessment & Plan Note (Signed)
Due for MRI surveillance.

## 2015-04-18 NOTE — Progress Notes (Addendum)
REVIEWED. MEDICAL MANAGEMENT FOR HEMORRHOIDS. PLAN FLEX SIG/IH BANDING IF BLEEDING PERSIST. COMPLETE RX FOR H PYLORI-Bx POS DEC 2014. OPV E30  MAR 2017 W/ [facility]. CONSIDER REPEAT EGD AFTER NEXT VISIT AND H PYLORI Rx COMPLETE.

## 2015-04-18 NOTE — Progress Notes (Signed)
Forgot to tell patient, recommend topical therapy for hemorrhoids. Prescription sent to pharmacy. Please let him know.

## 2015-04-18 NOTE — Progress Notes (Signed)
Pt is aware.  

## 2015-04-19 NOTE — Progress Notes (Signed)
PT is aware and I will forward to Stacy to nic OV appt for March 2017.

## 2015-04-19 NOTE — Progress Notes (Signed)
ON RECALL  °

## 2015-04-24 LAB — CBC WITH DIFFERENTIAL/PLATELET
Basophils Absolute: 0 10*3/uL (ref 0.0–0.1)
Basophils Relative: 0 % (ref 0–1)
Eosinophils Absolute: 0.1 10*3/uL (ref 0.0–0.7)
Eosinophils Relative: 1 % (ref 0–5)
HCT: 45.2 % (ref 39.0–52.0)
Hemoglobin: 15.7 g/dL (ref 13.0–17.0)
Lymphocytes Relative: 43 % (ref 12–46)
Lymphs Abs: 3.3 10*3/uL (ref 0.7–4.0)
MCH: 31.2 pg (ref 26.0–34.0)
MCHC: 34.7 g/dL (ref 30.0–36.0)
MCV: 89.9 fL (ref 78.0–100.0)
MPV: 10.7 fL (ref 8.6–12.4)
Monocytes Absolute: 1.1 10*3/uL — ABNORMAL HIGH (ref 0.1–1.0)
Monocytes Relative: 14 % — ABNORMAL HIGH (ref 3–12)
Neutro Abs: 3.2 10*3/uL (ref 1.7–7.7)
Neutrophils Relative %: 42 % — ABNORMAL LOW (ref 43–77)
Platelets: 220 10*3/uL (ref 150–400)
RBC: 5.03 MIL/uL (ref 4.22–5.81)
RDW: 14 % (ref 11.5–15.5)
WBC: 7.6 10*3/uL (ref 4.0–10.5)

## 2015-04-25 LAB — COMPREHENSIVE METABOLIC PANEL
ALT: 21 U/L (ref 9–46)
AST: 19 U/L (ref 10–35)
Albumin: 4.3 g/dL (ref 3.6–5.1)
Alkaline Phosphatase: 69 U/L (ref 40–115)
BUN: 16 mg/dL (ref 7–25)
CO2: 28 mmol/L (ref 20–31)
Calcium: 9.1 mg/dL (ref 8.6–10.3)
Chloride: 103 mmol/L (ref 98–110)
Creat: 0.86 mg/dL (ref 0.70–1.33)
Glucose, Bld: 92 mg/dL (ref 65–99)
Potassium: 4 mmol/L (ref 3.5–5.3)
Sodium: 139 mmol/L (ref 135–146)
Total Bilirubin: 0.6 mg/dL (ref 0.2–1.2)
Total Protein: 7 g/dL (ref 6.1–8.1)

## 2015-04-25 LAB — LIPASE: Lipase: 54 U/L (ref 7–60)

## 2015-04-26 DIAGNOSIS — M545 Low back pain: Secondary | ICD-10-CM | POA: Diagnosis not present

## 2015-04-26 DIAGNOSIS — G8929 Other chronic pain: Secondary | ICD-10-CM | POA: Diagnosis not present

## 2015-05-02 NOTE — Progress Notes (Signed)
Quick Note:  Please let patient know labs ok. ______

## 2015-05-02 NOTE — Progress Notes (Signed)
Quick Note:  Pt is aware. ______ 

## 2015-05-03 ENCOUNTER — Ambulatory Visit (HOSPITAL_COMMUNITY)
Admission: RE | Admit: 2015-05-03 | Discharge: 2015-05-03 | Disposition: A | Discharge status: Home / Self Care | Payer: Medicare Other | Source: Ambulatory Visit | Attending: Gastroenterology | Admitting: Gastroenterology

## 2015-05-03 ENCOUNTER — Other Ambulatory Visit: Payer: Self-pay | Admitting: Neurology

## 2015-05-03 DIAGNOSIS — K769 Liver disease, unspecified: Secondary | ICD-10-CM

## 2015-05-03 DIAGNOSIS — K7689 Other specified diseases of liver: Secondary | ICD-10-CM | POA: Insufficient documentation

## 2015-05-03 DIAGNOSIS — R1030 Lower abdominal pain, unspecified: Secondary | ICD-10-CM | POA: Diagnosis not present

## 2015-05-03 MED ORDER — GADOBENATE DIMEGLUMINE 529 MG/ML IV SOLN
20.0000 mL | Freq: Once | INTRAVENOUS | Status: AC | PRN
  Administered 2015-05-03: 20 mL via INTRAVENOUS

## 2015-05-10 NOTE — Progress Notes (Signed)
Quick Note:  Please let patient know. ______

## 2015-05-21 DIAGNOSIS — F5221 Male erectile disorder: Secondary | ICD-10-CM | POA: Diagnosis not present

## 2015-05-21 DIAGNOSIS — N401 Enlarged prostate with lower urinary tract symptoms: Secondary | ICD-10-CM | POA: Diagnosis not present

## 2015-05-24 DIAGNOSIS — M79605 Pain in left leg: Secondary | ICD-10-CM | POA: Diagnosis not present

## 2015-05-24 DIAGNOSIS — M79662 Pain in left lower leg: Secondary | ICD-10-CM | POA: Diagnosis not present

## 2015-05-24 DIAGNOSIS — G894 Chronic pain syndrome: Secondary | ICD-10-CM | POA: Diagnosis not present

## 2015-05-24 DIAGNOSIS — M545 Low back pain: Secondary | ICD-10-CM | POA: Diagnosis not present

## 2015-06-11 ENCOUNTER — Encounter: Payer: Self-pay | Admitting: Gastroenterology

## 2015-07-06 ENCOUNTER — Other Ambulatory Visit: Payer: Self-pay | Admitting: Gastroenterology

## 2015-07-09 ENCOUNTER — Other Ambulatory Visit: Payer: Self-pay

## 2015-07-09 DIAGNOSIS — F5221 Male erectile disorder: Secondary | ICD-10-CM | POA: Diagnosis not present

## 2015-07-09 DIAGNOSIS — I10 Essential (primary) hypertension: Secondary | ICD-10-CM | POA: Diagnosis not present

## 2015-07-09 MED ORDER — OMEPRAZOLE 20 MG PO CPDR: 20.0000 mg | DELAYED_RELEASE_CAPSULE | Freq: Every day | ORAL | Status: DC

## 2015-08-20 DIAGNOSIS — I10 Essential (primary) hypertension: Secondary | ICD-10-CM | POA: Diagnosis not present

## 2015-08-20 DIAGNOSIS — F5221 Male erectile disorder: Secondary | ICD-10-CM | POA: Diagnosis not present

## 2015-08-20 DIAGNOSIS — M545 Low back pain: Secondary | ICD-10-CM | POA: Diagnosis not present

## 2015-08-20 DIAGNOSIS — Z6827 Body mass index (BMI) 27.0-27.9, adult: Secondary | ICD-10-CM | POA: Diagnosis not present

## 2015-12-03 DIAGNOSIS — F5221 Male erectile disorder: Secondary | ICD-10-CM | POA: Diagnosis not present

## 2015-12-03 DIAGNOSIS — I10 Essential (primary) hypertension: Secondary | ICD-10-CM | POA: Diagnosis not present

## 2015-12-03 DIAGNOSIS — M542 Cervicalgia: Secondary | ICD-10-CM | POA: Diagnosis not present

## 2015-12-03 DIAGNOSIS — E162 Hypoglycemia, unspecified: Secondary | ICD-10-CM | POA: Diagnosis not present

## 2015-12-03 DIAGNOSIS — N401 Enlarged prostate with lower urinary tract symptoms: Secondary | ICD-10-CM | POA: Diagnosis not present

## 2015-12-03 DIAGNOSIS — E782 Mixed hyperlipidemia: Secondary | ICD-10-CM | POA: Diagnosis not present

## 2015-12-03 DIAGNOSIS — N529 Male erectile dysfunction, unspecified: Secondary | ICD-10-CM | POA: Diagnosis not present

## 2016-01-02 DIAGNOSIS — M545 Low back pain: Secondary | ICD-10-CM | POA: Diagnosis not present

## 2016-01-02 DIAGNOSIS — I10 Essential (primary) hypertension: Secondary | ICD-10-CM | POA: Diagnosis not present

## 2016-01-02 DIAGNOSIS — M542 Cervicalgia: Secondary | ICD-10-CM | POA: Diagnosis not present

## 2016-01-02 DIAGNOSIS — E785 Hyperlipidemia, unspecified: Secondary | ICD-10-CM | POA: Diagnosis not present

## 2016-01-15 ENCOUNTER — Encounter (HOSPITAL_COMMUNITY): Payer: Self-pay | Admitting: Emergency Medicine

## 2016-01-15 ENCOUNTER — Emergency Department (HOSPITAL_COMMUNITY)
Admission: EM | Admit: 2016-01-15 | Discharge: 2016-01-16 | Disposition: A | Discharge status: Home / Self Care | Payer: Medicare Other | Attending: Emergency Medicine | Admitting: Emergency Medicine

## 2016-01-15 ENCOUNTER — Emergency Department (HOSPITAL_COMMUNITY): Payer: Medicare Other

## 2016-01-15 DIAGNOSIS — Z79899 Other long term (current) drug therapy: Secondary | ICD-10-CM | POA: Diagnosis not present

## 2016-01-15 DIAGNOSIS — R011 Cardiac murmur, unspecified: Secondary | ICD-10-CM | POA: Diagnosis not present

## 2016-01-15 DIAGNOSIS — I10 Essential (primary) hypertension: Secondary | ICD-10-CM | POA: Insufficient documentation

## 2016-01-15 DIAGNOSIS — F17210 Nicotine dependence, cigarettes, uncomplicated: Secondary | ICD-10-CM | POA: Diagnosis not present

## 2016-01-15 DIAGNOSIS — J029 Acute pharyngitis, unspecified: Secondary | ICD-10-CM | POA: Diagnosis not present

## 2016-01-15 DIAGNOSIS — R05 Cough: Secondary | ICD-10-CM | POA: Diagnosis not present

## 2016-01-15 DIAGNOSIS — R221 Localized swelling, mass and lump, neck: Secondary | ICD-10-CM | POA: Diagnosis not present

## 2016-01-15 HISTORY — DX: Unspecified osteoarthritis, unspecified site: M19.90

## 2016-01-15 LAB — COMPREHENSIVE METABOLIC PANEL
ALT: 24 U/L (ref 17–63)
AST: 28 U/L (ref 15–41)
Albumin: 4.2 g/dL (ref 3.5–5.0)
Alkaline Phosphatase: 89 U/L (ref 38–126)
Anion gap: 10 (ref 5–15)
BUN: 16 mg/dL (ref 6–20)
CO2: 26 mmol/L (ref 22–32)
Calcium: 8.9 mg/dL (ref 8.9–10.3)
Chloride: 96 mmol/L — ABNORMAL LOW (ref 101–111)
Creatinine, Ser: 1.23 mg/dL (ref 0.61–1.24)
GFR calc Af Amer: >60 mL/min (ref >60)
GFR calc non Af Amer: >60 mL/min (ref >60)
Glucose, Bld: 104 mg/dL — ABNORMAL HIGH (ref 65–99)
Potassium: 3.1 mmol/L — ABNORMAL LOW (ref 3.5–5.1)
Sodium: 132 mmol/L — ABNORMAL LOW (ref 135–145)
Total Bilirubin: 1.4 mg/dL — ABNORMAL HIGH (ref 0.3–1.2)
Total Protein: 8.2 g/dL — ABNORMAL HIGH (ref 6.5–8.1)

## 2016-01-15 LAB — RAPID STREP SCREEN (MED CTR MEBANE ONLY): Streptococcus, Group A Screen (Direct): NEGATIVE

## 2016-01-15 LAB — CBC WITH DIFFERENTIAL/PLATELET
Basophils Absolute: 0 10*3/uL (ref 0.0–0.1)
Basophils Relative: 0 %
Eosinophils Absolute: 0 10*3/uL (ref 0.0–0.7)
Eosinophils Relative: 0 %
HCT: 48 % (ref 39.0–52.0)
Hemoglobin: 17 g/dL (ref 13.0–17.0)
Lymphocytes Relative: 23 %
Lymphs Abs: 2.8 10*3/uL (ref 0.7–4.0)
MCH: 31.9 pg (ref 26.0–34.0)
MCHC: 35.4 g/dL (ref 30.0–36.0)
MCV: 90.1 fL (ref 78.0–100.0)
Monocytes Absolute: 2.8 10*3/uL — ABNORMAL HIGH (ref 0.1–1.0)
Monocytes Relative: 23 %
Neutro Abs: 6.5 10*3/uL (ref 1.7–7.7)
Neutrophils Relative %: 54 %
Platelets: 180 10*3/uL (ref 150–400)
RBC: 5.33 MIL/uL (ref 4.22–5.81)
RDW: 13.2 % (ref 11.5–15.5)
Smear Review: ADEQUATE
WBC: 12.1 10*3/uL — ABNORMAL HIGH (ref 4.0–10.5)

## 2016-01-15 LAB — MONONUCLEOSIS SCREEN: Mono Screen: NEGATIVE

## 2016-01-15 LAB — I-STAT CG4 LACTIC ACID, ED: Lactic Acid, Venous: 1.71 mmol/L (ref 0.5–1.9)

## 2016-01-15 LAB — TROPONIN I: Troponin I: <0.03 ng/mL (ref <0.03)

## 2016-01-15 MED ORDER — SODIUM CHLORIDE 0.9 % IV BOLUS (SEPSIS)
1000.0000 mL | Freq: Once | INTRAVENOUS | Status: AC
  Administered 2016-01-15: 1000 mL via INTRAVENOUS

## 2016-01-15 MED ORDER — DEXAMETHASONE SODIUM PHOSPHATE 4 MG/ML IJ SOLN
10.0000 mg | Freq: Once | INTRAMUSCULAR | Status: AC
  Administered 2016-01-15: 10 mg via INTRAVENOUS
  Filled 2016-01-15: qty 3

## 2016-01-15 MED ORDER — CLINDAMYCIN HCL 300 MG PO CAPS: 300.0000 mg | ORAL_CAPSULE | Freq: Three times a day (TID) | ORAL | 0 refills | Status: DC

## 2016-01-15 MED ORDER — DEXAMETHASONE SODIUM PHOSPHATE 10 MG/ML IJ SOLN
INTRAMUSCULAR | Status: AC
  Administered 2016-01-15: 10 mg
  Filled 2016-01-15: qty 1

## 2016-01-15 MED ORDER — CLINDAMYCIN PHOSPHATE 600 MG/50ML IV SOLN
600.0000 mg | Freq: Once | INTRAVENOUS | Status: AC
  Administered 2016-01-15: 600 mg via INTRAVENOUS
  Filled 2016-01-15: qty 50

## 2016-01-15 MED ORDER — ACETAMINOPHEN 325 MG PO TABS
650.0000 mg | ORAL_TABLET | Freq: Once | ORAL | Status: AC
  Administered 2016-01-15: 650 mg via ORAL
  Filled 2016-01-15: qty 2

## 2016-01-15 MED ORDER — HYDROCODONE-ACETAMINOPHEN 5-325 MG PO TABS
ORAL_TABLET | ORAL | Status: AC
  Filled 2016-01-15: qty 1

## 2016-01-15 MED ORDER — IOPAMIDOL (ISOVUE-300) INJECTION 61%
75.0000 mL | Freq: Once | INTRAVENOUS | Status: AC | PRN
  Administered 2016-01-15: 75 mL via INTRAVENOUS

## 2016-01-15 MED ORDER — HYDROCODONE-ACETAMINOPHEN 5-325 MG PO TABS: 1.0000 | ORAL_TABLET | ORAL | 0 refills | Status: DC | PRN

## 2016-01-15 NOTE — Discharge Instructions (Signed)
Follow up with your primary doctor and the ENT doctor. Return to the ED if you develop worsening pain, difficulty breathing or swallowing, or any other concerns.

## 2016-01-15 NOTE — ED Provider Notes (Signed)
[facility] DEPT Provider Note   CSN: [id number] Arrival date & time: 01/15/16  1847     History   Chief Complaint Chief Complaint  Patient presents with  . Generalized Body Aches    HPI Stephen Koch is a 55 y.o. male.  Patient presents with a five-day history of headache, sore throat, body aches and fever. States he is not taking anything at home for the pain or fever. Reports started as a sore throat 4 days ago and progressed to diffuse body aches with headache. Coughing but no productive cough. Did not check his temperature at home. No sick contacts or recent travel. No rash. No vomiting or diarrhea. No abdominal pain. Has some chest tightness with coughing. Pain with swallowing but no difficulty breathing.   The history is provided by the patient.    Past Medical History:  Diagnosis Date  . Arthritis   . Chronic back pain    Forklift injury  . Essential hypertension, benign   . GERD (gastroesophageal reflux disease)   . History of medication noncompliance     Patient Active Problem List   Diagnosis Date Noted  . Helicobacter pylori gastritis 04/18/2015  . ETOH abuse 04/18/2015  . Liver lesion 04/18/2015  . Essential hypertension, benign 11/04/2013  . Heart murmur 11/04/2013  . Back pain associated with peripheral numbness 08/10/2013  . Upper abdominal pain 03/31/2013  . Hematochezia 07/01/2012    Past Surgical History:  Procedure Laterality Date  . COLONOSCOPY N/A 07/06/2012   [id number] mucosa in the terminal ileum/Moderate sized internal hemorrhoids  . ESOPHAGOGASTRODUODENOSCOPY N/A 04/08/2013   [id number] gastric ulcer/duodenal inflammation. 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 Left knee surgery    . Right arm surgery         Home Medications    Prior to Admission medications   Medication Sig Start Date End Date Taking? Authorizing Provider  amoxicillin (AMOXIL) 500 MG capsule Take 2  capsules (1,000 mg total) by mouth 2 (two) times daily. 04/18/15   Mahala Menghini, PA-C  atorvastatin (LIPITOR) 20 MG tablet Take 20 mg by mouth at bedtime.  10/24/13   Historical Provider, MD  clarithromycin (BIAXIN) 500 MG tablet Take 1 tablet (500 mg total) by mouth 2 (two) times daily. 04/18/15   Mahala Menghini, PA-C  gabapentin (NEURONTIN) 300 MG capsule Take 300 mg by mouth daily.  03/29/15   Historical Provider, MD  hydrocortisone (ANUSOL-HC) 2.5 % rectal cream Place 1 application rectally 2 (two) times daily. 04/18/15   Mahala Menghini, PA-C  methocarbamol (ROBAXIN) 500 MG tablet Take 500 mg by mouth 2 (two) times daily.    Historical Provider, MD  naproxen (NAPROSYN) 500 MG tablet Take 500 mg by mouth 2 (two) times daily.  10/31/13   Historical Provider, MD  omeprazole (PRILOSEC) 20 MG capsule Take 1 capsule (20 mg total) by mouth daily. 07/08/15   Mahala Menghini, PA-C  omeprazole (PRILOSEC) 20 MG capsule Take 1 capsule (20 mg total) by mouth daily. 07/09/15   Carlis Stable, NP    Family History Family History  Problem Relation Age of Onset  . Heart disease Father     Social History Social History  Substance Use Topics  . Smoking status: Current Every Day Smoker    Packs/day: 0.50    Years: 40.00    Types: Cigarettes  . Smokeless tobacco: Not on file     Comment: Smokes about 4 cigarettes  daily  . Alcohol use 0.0 oz/week     Comment: 12 pk beer within a day, not every day.      Allergies   Ibuprofen   Review of Systems Review of Systems  Constitutional: Positive for activity change, appetite change, chills, fatigue and fever.  HENT: Positive for congestion, rhinorrhea, sore throat and trouble swallowing. Negative for facial swelling and sinus pressure.   Eyes: Negative for visual disturbance.  Respiratory: Positive for cough. Negative for chest tightness.   Cardiovascular: Negative for chest pain.  Gastrointestinal: Negative for abdominal pain, nausea and vomiting.    Genitourinary: Negative for difficulty urinating, hematuria and testicular pain.  Musculoskeletal: Positive for arthralgias and myalgias.  Skin: Negative for rash.  Neurological: Positive for headaches. Negative for dizziness.  A complete 10 system review of systems was obtained and all systems are negative except as noted in the HPI and PMH.     Physical Exam Updated Vital Signs BP 160/82 (BP Location: Right Arm)   Pulse 87   Temp 101.7 F (38.7 C) (Oral)   Resp 18   Ht 6\' 3"  (1.905 m)   Wt 210 lb (95.3 kg)   SpO2 96%   BMI 26.25 kg/m   Physical Exam  Constitutional: He is oriented to person, place, and time. He appears well-developed and well-nourished. No distress.  HENT:  Head: Normocephalic and atraumatic.  Right Ear: External ear normal.  Left Ear: External ear normal.  Mouth/Throat: Oropharyngeal exudate present.  Diffuse erythema to the oropharynx, tonsillar swelling bilaterally with exudates left greater than right.     Eyes: Conjunctivae and EOM are normal. Pupils are equal, round, and reactive to light.  Neck: Normal range of motion. Neck supple.  No meningismus.  Cardiovascular: Normal rate, regular rhythm and intact distal pulses.   Murmur heard. 3/6 systolic murmur (chronic per patient)  Pulmonary/Chest: Effort normal and breath sounds normal. No respiratory distress.  Abdominal: Soft. There is no tenderness. There is no rebound and no guarding.  Musculoskeletal: Normal range of motion. He exhibits no edema or tenderness.  Neurological: He is alert and oriented to person, place, and time. No cranial nerve deficit. He exhibits normal muscle tone. Coordination normal.   5/5 strength throughout. CN 2-12 intact.Equal grip strength.   Skin: Skin is warm.  Psychiatric: He has a normal mood and affect. His behavior is normal.  Nursing note and vitals reviewed.    ED Treatments / Results  Labs (all labs ordered are listed, but only abnormal results are  displayed) Labs Reviewed  CBC WITH DIFFERENTIAL/PLATELET - Abnormal; Notable for the following:       Result Value   WBC 12.1 (*)    Monocytes Absolute 2.8 (*)    All other components within normal limits  COMPREHENSIVE METABOLIC PANEL - Abnormal; Notable for the following:    Sodium 132 (*)    Potassium 3.1 (*)    Chloride 96 (*)    Glucose, Bld 104 (*)    Total Protein 8.2 (*)    Total Bilirubin 1.4 (*)    All other components within normal limits  RAPID STREP SCREEN (NOT AT [facility])  CULTURE, BLOOD (ROUTINE X 2)  CULTURE, BLOOD (ROUTINE X 2)  CULTURE, GROUP A STREP [facility])  MONONUCLEOSIS SCREEN  TROPONIN I  I-STAT CG4 LACTIC ACID, ED    EKG  EKG Interpretation  Date/Time:  Tuesday January 15 2016 20:35:31 EDT Ventricular Rate:  82 PR Interval:    QRS Duration: 94 QT Interval:  343 QTC Calculation: 401 R Axis:   15 Text Interpretation:  Sinus rhythm Probable left atrial enlargement Abnormal R-wave progression, early transition Left ventricular hypertrophy Anterior ST elevation, probably due to LVH ST elevation v2 and v3. likely 2/2 LVH and early repolarization d/w Dr. Claiborne Billings Confirmed by Wyvonnia Dusky  MD, [place] [phone]) on 01/15/2016 9:02:30 PM       Radiology Dg Chest 2 View  Result Date: 01/15/2016 CLINICAL DATA:  Fever, chills, cough, and congestion since Friday. EXAM: CHEST  2 VIEW COMPARISON:  01/15/2013 FINDINGS: The heart size and mediastinal contours are within normal limits. Both lungs are clear. The visualized skeletal structures are unremarkable. IMPRESSION: No active cardiopulmonary disease. Electronically Signed   By: Lucienne Capers M.D.   On: 01/15/2016 22:33    Procedures Procedures (including critical care time)  Medications Ordered in ED Medications  sodium chloride 0.9 % bolus 1,000 mL (1,000 mLs Intravenous New Bag/Given 01/15/16 2040)  acetaminophen (TYLENOL) tablet 650 mg (not administered)  dexamethasone (DECADRON) injection 10 mg (not  administered)  sodium chloride 0.9 % bolus 1,000 mL (not administered)     Initial Impression / Assessment and Plan / ED Course  I have reviewed the triage vital signs and the nursing notes.  Pertinent labs & imaging results that were available during my care of the patient were reviewed by me and considered in my medical decision making (see chart for details).  Clinical Course  Patient with 4 day history of fever, headache, body aches, sore throat. Exam concerning for peritonsillar abscess. Also mentioned that he had chest pain coming and going with coughing. EKG shows ST elevation in V2 and V3 is concave up. No comparison.  Discussed with Dr. Claiborne Billings of cardiology who reviewed the EKG. Patient has no chest pain currently. His story is atypical for ACS. Dr. Claiborne Billings feels this represents LVH and early repolarization.  EKG similar to 2015. Troponin negative. No chest pain currently.  Leukocytosis noted. Lactate normal.  IVF and IV clindamycin given, IV decadron.  With fever and heart mumur (reportedly chronic), blood cultures sent.  CT neck pending at time of sign out to Dr. Reather Converse. Rapid strep negative but strong concern for strep, plan treat with clindamycin. Monospot negative.  Final Clinical Impressions(s) / ED Diagnoses   Final diagnoses:  None    New Prescriptions New Prescriptions   No medications on file     Ezequiel Essex, MD 01/15/16 2256

## 2016-01-15 NOTE — ED Triage Notes (Signed)
On Friday headache, sore throat, body aches, fever

## 2016-01-18 LAB — CULTURE, GROUP A STREP (THRC)

## 2016-01-20 LAB — CULTURE, BLOOD (ROUTINE X 2)
Culture: NO GROWTH
Culture: NO GROWTH

## 2016-03-26 DIAGNOSIS — M545 Low back pain: Secondary | ICD-10-CM | POA: Diagnosis not present

## 2016-03-26 DIAGNOSIS — G894 Chronic pain syndrome: Secondary | ICD-10-CM | POA: Diagnosis not present

## 2016-03-26 DIAGNOSIS — M79609 Pain in unspecified limb: Secondary | ICD-10-CM | POA: Diagnosis not present

## 2016-03-26 DIAGNOSIS — M542 Cervicalgia: Secondary | ICD-10-CM | POA: Diagnosis not present

## 2016-03-26 DIAGNOSIS — M546 Pain in thoracic spine: Secondary | ICD-10-CM | POA: Diagnosis not present

## 2016-03-26 DIAGNOSIS — G8911 Acute pain due to trauma: Secondary | ICD-10-CM | POA: Diagnosis not present

## 2016-04-02 DIAGNOSIS — I10 Essential (primary) hypertension: Secondary | ICD-10-CM | POA: Diagnosis not present

## 2016-04-02 DIAGNOSIS — M545 Low back pain: Secondary | ICD-10-CM | POA: Diagnosis not present

## 2016-04-02 DIAGNOSIS — E785 Hyperlipidemia, unspecified: Secondary | ICD-10-CM | POA: Diagnosis not present

## 2016-04-02 DIAGNOSIS — M542 Cervicalgia: Secondary | ICD-10-CM | POA: Diagnosis not present

## 2016-04-25 DIAGNOSIS — M545 Low back pain: Secondary | ICD-10-CM | POA: Diagnosis not present

## 2016-04-25 DIAGNOSIS — G894 Chronic pain syndrome: Secondary | ICD-10-CM | POA: Diagnosis not present

## 2016-04-25 DIAGNOSIS — M542 Cervicalgia: Secondary | ICD-10-CM | POA: Diagnosis not present

## 2016-07-07 DIAGNOSIS — I10 Essential (primary) hypertension: Secondary | ICD-10-CM | POA: Diagnosis not present

## 2016-07-07 DIAGNOSIS — E785 Hyperlipidemia, unspecified: Secondary | ICD-10-CM | POA: Diagnosis not present

## 2016-10-22 ENCOUNTER — Emergency Department (HOSPITAL_COMMUNITY)
Admission: EM | Admit: 2016-10-22 | Discharge: 2016-10-22 | Disposition: A | Discharge status: Home / Self Care | Payer: Medicare Other | Attending: Emergency Medicine | Admitting: Emergency Medicine

## 2016-10-22 ENCOUNTER — Encounter (HOSPITAL_COMMUNITY): Payer: Self-pay | Admitting: *Deleted

## 2016-10-22 ENCOUNTER — Emergency Department (HOSPITAL_COMMUNITY): Payer: Medicare Other

## 2016-10-22 DIAGNOSIS — I10 Essential (primary) hypertension: Secondary | ICD-10-CM | POA: Insufficient documentation

## 2016-10-22 DIAGNOSIS — F17210 Nicotine dependence, cigarettes, uncomplicated: Secondary | ICD-10-CM | POA: Insufficient documentation

## 2016-10-22 DIAGNOSIS — F1290 Cannabis use, unspecified, uncomplicated: Secondary | ICD-10-CM | POA: Insufficient documentation

## 2016-10-22 DIAGNOSIS — B349 Viral infection, unspecified: Secondary | ICD-10-CM | POA: Insufficient documentation

## 2016-10-22 DIAGNOSIS — J111 Influenza due to unidentified influenza virus with other respiratory manifestations: Secondary | ICD-10-CM

## 2016-10-22 DIAGNOSIS — R69 Illness, unspecified: Secondary | ICD-10-CM

## 2016-10-22 DIAGNOSIS — Z79899 Other long term (current) drug therapy: Secondary | ICD-10-CM | POA: Diagnosis not present

## 2016-10-22 DIAGNOSIS — R05 Cough: Secondary | ICD-10-CM | POA: Diagnosis not present

## 2016-10-22 DIAGNOSIS — R531 Weakness: Secondary | ICD-10-CM | POA: Diagnosis present

## 2016-10-22 LAB — BASIC METABOLIC PANEL
Anion gap: 7 (ref 5–15)
BUN: 10 mg/dL (ref 6–20)
CO2: 29 mmol/L (ref 22–32)
Calcium: 9.1 mg/dL (ref 8.9–10.3)
Chloride: 100 mmol/L — ABNORMAL LOW (ref 101–111)
Creatinine, Ser: 0.93 mg/dL (ref 0.61–1.24)
GFR calc Af Amer: >60 mL/min (ref >60)
GFR calc non Af Amer: >60 mL/min (ref >60)
Glucose, Bld: 78 mg/dL (ref 65–99)
Potassium: 3.8 mmol/L (ref 3.5–5.1)
Sodium: 136 mmol/L (ref 135–145)

## 2016-10-22 LAB — CBC WITH DIFFERENTIAL/PLATELET
Basophils Absolute: 0 10*3/uL (ref 0.0–0.1)
Basophils Relative: 0 %
Eosinophils Absolute: 0 10*3/uL (ref 0.0–0.7)
Eosinophils Relative: 0 %
HCT: 46.4 % (ref 39.0–52.0)
Hemoglobin: 16.1 g/dL (ref 13.0–17.0)
Lymphocytes Relative: 16 %
Lymphs Abs: 2 10*3/uL (ref 0.7–4.0)
MCH: 31.9 pg (ref 26.0–34.0)
MCHC: 34.7 g/dL (ref 30.0–36.0)
MCV: 91.9 fL (ref 78.0–100.0)
Monocytes Absolute: 1.9 10*3/uL — ABNORMAL HIGH (ref 0.1–1.0)
Monocytes Relative: 15 %
Neutro Abs: 8.6 10*3/uL — ABNORMAL HIGH (ref 1.7–7.7)
Neutrophils Relative %: 69 %
Platelets: 209 10*3/uL (ref 150–400)
RBC: 5.05 MIL/uL (ref 4.22–5.81)
RDW: 15.1 % (ref 11.5–15.5)
WBC: 12.5 10*3/uL — ABNORMAL HIGH (ref 4.0–10.5)

## 2016-10-22 MED ORDER — BENZONATATE 100 MG PO CAPS: 100.0000 mg | ORAL_CAPSULE | Freq: Three times a day (TID) | ORAL | 0 refills | Status: DC | PRN

## 2016-10-22 NOTE — ED Provider Notes (Signed)
[facility] DEPT Provider Note   CSN: [id number] Arrival date & time: 10/22/16  1117     History   Chief Complaint Chief Complaint  Patient presents with  . Nausea  . Weakness    HPI Stephen Koch is a 56 y.o. male.  The history is provided by the patient.  Weakness  Primary symptoms include no focal weakness, no loss of sensation, no loss of balance, no visual change, no auditory change, and no dizziness. This is a new problem. The current episode started more than 2 days ago. The problem has been gradually worsening. There was no focality noted. There has been no fever. Associated symptoms include vomiting and headaches. Pertinent negatives include no shortness of breath, no chest pain, no altered mental status and no confusion. There were no medications administered prior to arrival.   56 year old male who presents with 3 days cough, with sputum, congestion, sore throat, body aches, subjective fever and chills. No sick contacts. Nausea and vomiting 2 days ago, now resolved and tolerating PO in take. With associated generalized headache. Has had fatigue and generalized weakness. No diarrhea, abdominal pain, dysuria, urinary frequency. No recent travel. Did not take any treatments. No alleviating or aggravating factors.  Past Medical History:  Diagnosis Date  . Arthritis   . Chronic back pain    Forklift injury  . Essential hypertension, benign   . GERD (gastroesophageal reflux disease)   . History of medication noncompliance     Patient Active Problem List   Diagnosis Date Noted  . Helicobacter pylori gastritis 04/18/2015  . ETOH abuse 04/18/2015  . Liver lesion 04/18/2015  . Essential hypertension, benign 11/04/2013  . Heart murmur 11/04/2013  . Back pain associated with peripheral numbness 08/10/2013  . Upper abdominal pain 03/31/2013  . Hematochezia 07/01/2012    Past Surgical History:  Procedure Laterality Date  . COLONOSCOPY N/A 07/06/2012   [id number]  mucosa in the terminal ileum/Moderate sized internal hemorrhoids  . ESOPHAGOGASTRODUODENOSCOPY N/A 04/08/2013   [id number] gastric ulcer/duodenal inflammation. 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 Left knee surgery    . Right arm surgery         Home Medications    Prior to Admission medications   Medication Sig Start Date End Date Taking? Authorizing Provider  clindamycin (CLEOCIN) 300 MG capsule Take 1 capsule (300 mg total) by mouth 3 (three) times daily. 01/15/16   Rancour, Annie Main, MD  HYDROcodone-acetaminophen (NORCO/VICODIN) 5-325 MG tablet Take 1 tablet by mouth every 4 (four) hours as needed. 01/15/16   Rancour, Annie Main, MD  methocarbamol (ROBAXIN) 500 MG tablet Take 500 mg by mouth daily as needed for muscle spasms.     [provider]  naproxen (NAPROSYN) 500 MG tablet Take 500 mg by mouth daily as needed for mild pain or moderate pain.  10/31/13   [provider]    Family History Family History  Problem Relation Age of Onset  . Heart disease Father     Social History Social History  Substance Use Topics  . Smoking status: Current Every Day Smoker    Packs/day: 0.25    Years: 40.00    Types: Cigarettes  . Smokeless tobacco: Never Used     Comment: Smokes about 4 cigarettes daily  . Alcohol use 0.0 oz/week     Comment: 12 pk beer within a day, not every day.      Allergies   Ibuprofen  Review of Systems Review of Systems  Respiratory: Negative for shortness of breath.   Cardiovascular: Negative for chest pain.  Gastrointestinal: Positive for vomiting.  Neurological: Positive for weakness and headaches. Negative for dizziness, focal weakness and loss of balance.  Psychiatric/Behavioral: Negative for confusion.     Physical Exam Updated Vital Signs BP (!) 178/81 (BP Location: Right Arm)   Pulse 78   Temp 98 F (36.7 C) (Oral)   Resp 16   Ht 6\' 3"  (1.905 m)   Wt 99.8 kg (220  lb)   SpO2 100%   BMI 27.50 kg/m   Physical Exam Physical Exam  Nursing note and vitals reviewed. Constitutional: Well developed, well nourished, non-toxic, and in no acute distress Head: Normocephalic and atraumatic.  Mouth/Throat: Oropharynx is moist. Erythematous posterior oropharynx.  Neck: Normal range of motion. Neck supple.  Cardiovascular: Normal rate and regular rhythm.   Pulmonary/Chest: Effort normal and breath sounds normal.  Abdominal: Soft. There is no tenderness. There is no rebound and no guarding.  Musculoskeletal: Normal range of motion.  Neurological: Alert, no facial droop, fluent speech, moves all extremities symmetrically Skin: Skin is warm and dry.  Psychiatric: Cooperative   ED Treatments / Results  Labs (all labs ordered are listed, but only abnormal results are displayed) Labs Reviewed  CBC WITH DIFFERENTIAL/PLATELET - Abnormal; Notable for the following:       Result Value   WBC 12.5 (*)    Neutro Abs 8.6 (*)    Monocytes Absolute 1.9 (*)    All other components within normal limits  BASIC METABOLIC PANEL - Abnormal; Notable for the following:    Chloride 100 (*)    All other components within normal limits    EKG  EKG Interpretation None       Radiology Dg Chest 2 View  Result Date: 10/22/2016 CLINICAL DATA:  Body aches, headache, nausea and vomiting with sore throat. Productive cough for the past 3 days. Current smoker. EXAM: CHEST  2 VIEW COMPARISON:  Chest x-ray of January 15, 2016 FINDINGS: The lungs are well-expanded. The interstitial markings are coarse though stable. There is no alveolar infiltrate or pleural effusion. The heart and pulmonary vascularity are normal. The mediastinum is normal in width. The trachea is midline. The bony thorax exhibits no acute abnormality. IMPRESSION: Mild chronic bronchitic changes, stable. There is no acute cardiopulmonary abnormality. Electronically Signed   By: David  Martinique M.D.   On: 10/22/2016  12:03    Procedures Procedures (including critical care time)  Medications Ordered in ED Medications - No data to display   Initial Impression / Assessment and Plan / ED Course  I have reviewed the triage vital signs and the nursing notes.  Pertinent labs & imaging results that were available during my care of the patient were reviewed by me and considered in my medical decision making (see chart for details).     Presents with what sounds like viral syndrome or flu like illness. Well appearing. In no acute distress. Normal vitals. Well hydrated. Lungs clear. Abdomen benign. CXR visualized and without pneumonia or other acute processes. Blood work from triage is reassuring. Discussed supportive care. Strict return and follow-up instructions reviewed. He expressed understanding of all discharge instructions and felt comfortable with the plan of care.   Final Clinical Impressions(s) / ED Diagnoses   Final diagnoses:  Viral syndrome  Influenza-like illness    New Prescriptions New Prescriptions   No medications on file     Brantley Stage  Duo, MD 10/22/16 2446

## 2016-10-22 NOTE — ED Triage Notes (Addendum)
Pt c/o bodyaches, sore throat, headache, nausea, vomiting 2 days ago,  diaphoresis, thick Chiquetta Langner productive sputum with cough x 3 days.

## 2016-10-22 NOTE — Discharge Instructions (Signed)
Get plenty of rest. Drink plenty of fluids. Take tylenol and ibuprofen for aches and pains.  You have a flu like illness that will take 1-2 weeks to get better.   Return for worsening symptoms, including persistent fevers, difficulty breathing, confusion, intractable vomiting or any other symptoms concerning to you.

## 2016-11-03 DIAGNOSIS — J069 Acute upper respiratory infection, unspecified: Secondary | ICD-10-CM | POA: Diagnosis not present

## 2016-11-03 DIAGNOSIS — I10 Essential (primary) hypertension: Secondary | ICD-10-CM | POA: Diagnosis not present

## 2016-11-03 DIAGNOSIS — R05 Cough: Secondary | ICD-10-CM | POA: Diagnosis not present

## 2016-12-03 DIAGNOSIS — N341 Nonspecific urethritis: Secondary | ICD-10-CM | POA: Diagnosis not present

## 2016-12-10 DIAGNOSIS — N341 Nonspecific urethritis: Secondary | ICD-10-CM | POA: Diagnosis not present

## 2017-02-21 ENCOUNTER — Emergency Department (HOSPITAL_COMMUNITY): Payer: Medicare Other

## 2017-02-21 ENCOUNTER — Emergency Department (HOSPITAL_COMMUNITY)
Admission: EM | Admit: 2017-02-21 | Discharge: 2017-02-21 | Disposition: A | Discharge status: Home / Self Care | Payer: Medicare Other | Attending: Emergency Medicine | Admitting: Emergency Medicine

## 2017-02-21 ENCOUNTER — Encounter (HOSPITAL_COMMUNITY): Payer: Self-pay | Admitting: *Deleted

## 2017-02-21 DIAGNOSIS — F17210 Nicotine dependence, cigarettes, uncomplicated: Secondary | ICD-10-CM | POA: Insufficient documentation

## 2017-02-21 DIAGNOSIS — M25462 Effusion, left knee: Secondary | ICD-10-CM | POA: Diagnosis not present

## 2017-02-21 DIAGNOSIS — I10 Essential (primary) hypertension: Secondary | ICD-10-CM | POA: Diagnosis not present

## 2017-02-21 DIAGNOSIS — Y999 Unspecified external cause status: Secondary | ICD-10-CM | POA: Diagnosis not present

## 2017-02-21 DIAGNOSIS — M25562 Pain in left knee: Secondary | ICD-10-CM | POA: Diagnosis not present

## 2017-02-21 DIAGNOSIS — S8992XA Unspecified injury of left lower leg, initial encounter: Secondary | ICD-10-CM | POA: Diagnosis present

## 2017-02-21 DIAGNOSIS — Z79899 Other long term (current) drug therapy: Secondary | ICD-10-CM | POA: Insufficient documentation

## 2017-02-21 DIAGNOSIS — S86812A Strain of other muscle(s) and tendon(s) at lower leg level, left leg, initial encounter: Secondary | ICD-10-CM | POA: Insufficient documentation

## 2017-02-21 DIAGNOSIS — R52 Pain, unspecified: Secondary | ICD-10-CM | POA: Diagnosis not present

## 2017-02-21 DIAGNOSIS — Y939 Activity, unspecified: Secondary | ICD-10-CM | POA: Insufficient documentation

## 2017-02-21 DIAGNOSIS — X501XXA Overexertion from prolonged static or awkward postures, initial encounter: Secondary | ICD-10-CM | POA: Diagnosis not present

## 2017-02-21 DIAGNOSIS — Y929 Unspecified place or not applicable: Secondary | ICD-10-CM | POA: Insufficient documentation

## 2017-02-21 MED ORDER — HYDROCODONE-ACETAMINOPHEN 5-325 MG PO TABS: 2.0000 | ORAL_TABLET | ORAL | 0 refills | Status: DC | PRN

## 2017-02-21 MED ORDER — OXYCODONE-ACETAMINOPHEN 5-325 MG PO TABS
1.0000 | ORAL_TABLET | Freq: Once | ORAL | Status: AC
  Administered 2017-02-21: 1 via ORAL
  Filled 2017-02-21: qty 1

## 2017-02-21 MED ORDER — HYDROCODONE-ACETAMINOPHEN 5-325 MG PO TABS: 1.0000 | ORAL_TABLET | ORAL | 0 refills | Status: DC | PRN

## 2017-02-21 NOTE — Discharge Instructions (Signed)
It appears that you have ruptured your patellar tendon.  You will not be able to walk without wearing the knee immobilizer.  Remove the immobilizer, while lying to apply ice to the knee, 3 or 4 times a day for 30-45 minutes.  Call the orthopedic doctor for a follow-up appointment in 3 days.

## 2017-02-21 NOTE — ED Notes (Signed)
Pt states understanding of care given and follow up instructions.  Pt a.o, taken from ED in wheel chair, provided with crutches for home use

## 2017-02-21 NOTE — ED Provider Notes (Signed)
[facility] EMERGENCY DEPARTMENT Provider Note   CSN: [id number] Arrival date & time: 02/21/17  1934     History   Chief Complaint Chief Complaint  Patient presents with  . Knee Injury    HPI Stephen Koch is a 56 y.o. male.  He presents for evaluation of left knee injury, which occurred as his foot was planted, and he was turned looking behind him.  He was holding his dog and released, the dog pulled and the pop occurred.  He was unable to ambulate afterwards.  He did not fall.  He presents by EMS.  There are no other known modifying factors.  HPI  Past Medical History:  Diagnosis Date  . Arthritis   . Chronic back pain    Forklift injury  . Essential hypertension, benign   . GERD (gastroesophageal reflux disease)   . History of medication noncompliance     Patient Active Problem List   Diagnosis Date Noted  . Helicobacter pylori gastritis 04/18/2015  . ETOH abuse 04/18/2015  . Liver lesion 04/18/2015  . Essential hypertension, benign 11/04/2013  . Heart murmur 11/04/2013  . Back pain associated with peripheral numbness 08/10/2013  . Upper abdominal pain 03/31/2013  . Hematochezia 07/01/2012    Past Surgical History:  Procedure Laterality Date  . COLONOSCOPY N/A 07/06/2012   DJM:EQASTM mucosa in the terminal ileum/Moderate sized internal hemorrhoids  . ESOPHAGOGASTRODUODENOSCOPY N/A 04/08/2013   HDQ:QIWLN gastric ulcer/duodenal inflammation. 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 Left knee surgery    . Right arm surgery         Home Medications    Prior to Admission medications   Medication Sig Start Date End Date Taking? Authorizing Provider  benzonatate (TESSALON PERLES) 100 MG capsule Take 1 capsule (100 mg total) by mouth 3 (three) times daily as needed for cough. 10/22/16   Forde Dandy, MD  clindamycin (CLEOCIN) 300 MG capsule Take 1 capsule (300 mg total) by mouth 3 (three) times daily.  01/15/16   Rancour, Annie Main, MD  HYDROcodone-acetaminophen (NORCO) 5-325 MG tablet Take 1 tablet by mouth every 4 (four) hours as needed for moderate pain. 02/21/17   Daleen Bo, MD  HYDROcodone-acetaminophen (NORCO/VICODIN) 5-325 MG tablet Take 2 tablets by mouth every 4 (four) hours as needed. 02/21/17   Daleen Bo, MD  methocarbamol (ROBAXIN) 500 MG tablet Take 500 mg by mouth daily as needed for muscle spasms.     [provider]  naproxen (NAPROSYN) 500 MG tablet Take 500 mg by mouth daily as needed for mild pain or moderate pain.  10/31/13   [provider]    Family History Family History  Problem Relation Age of Onset  . Heart disease Father     Social History Social History  Substance Use Topics  . Smoking status: Current Every Day Smoker    Packs/day: 0.25    Years: 40.00    Types: Cigarettes  . Smokeless tobacco: Never Used     Comment: Smokes about 4 cigarettes daily  . Alcohol use 0.0 oz/week     Comment: 12 pk beer within a day, not every day.      Allergies   Ibuprofen   Review of Systems Review of Systems  All other systems reviewed and are negative.    Physical Exam Updated Vital Signs BP (!) 188/87 (BP Location: Right Arm)   Pulse 60   Temp 98.2 F (36.8 C) (Oral)  Resp 20   Ht 6\' 3"  (1.905 m)   Wt 95.3 kg (210 lb)   SpO2 98%   BMI 26.25 kg/m   Physical Exam  Constitutional: He is oriented to person, place, and time. He appears well-developed and well-nourished.  HENT:  Head: Normocephalic and atraumatic.  Right Ear: External ear normal.  Left Ear: External ear normal.  Eyes: Pupils are equal, round, and reactive to light. Conjunctivae and EOM are normal.  Neck: Normal range of motion and phonation normal. Neck supple.  Cardiovascular: Normal rate.   Pulmonary/Chest: Effort normal. He exhibits no bony tenderness.  Musculoskeletal:  Left knee-appearance of patella alter, with patellar defect, consistent with tendon  rupture.  No palpable effusion.  Left knee tenderness primarily subpatellar.  Neurovascular intact distally in the left foot.  Neurological: He is alert and oriented to person, place, and time. No cranial nerve deficit or sensory deficit. He exhibits normal muscle tone. Coordination normal.  Skin: Skin is warm, dry and intact.  Psychiatric: He has a normal mood and affect. His behavior is normal. Judgment and thought content normal.  Nursing note and vitals reviewed.    ED Treatments / Results  Labs (all labs ordered are listed, but only abnormal results are displayed) Labs Reviewed - No data to display  EKG  EKG Interpretation None       Radiology Dg Knee Complete 4 Views Left  Result Date: 02/21/2017 CLINICAL DATA:  Left knee pain after fall. EXAM: LEFT KNEE - COMPLETE 4+ VIEW COMPARISON:  None. FINDINGS: No acute fracture or malalignment. Moderate medial compartment joint space narrowing with mild genu varus deformity. Small suprapatellar joint effusion. Bone mineralization is normal. Atherosclerotic vascular calcifications. IMPRESSION: 1. Small suprapatellar joint effusion.  Insert dense 2. Moderate medial compartment degenerative changes. Electronically Signed   By: Titus Dubin M.D.   On: 02/21/2017 20:24    Procedures Procedures (including critical care time)  Medications Ordered in ED Medications  oxyCODONE-acetaminophen (PERCOCET/ROXICET) 5-325 MG per tablet 1 tablet (1 tablet Oral Given 02/21/17 2024)     Initial Impression / Assessment and Plan / ED Course  I have reviewed the triage vital signs and the nursing notes.  Pertinent labs & imaging results that were available during my care of the patient were reviewed by me and considered in my medical decision making (see chart for details).      Patient Vitals for the past 24 hrs:  BP Temp Temp src Pulse Resp SpO2 Height Weight  02/21/17 1942 - - - - - - 6\' 3"  (1.905 m) 95.3 kg (210 lb)  02/21/17 1941 (!)  188/87 98.2 F (36.8 C) Oral 60 20 98 % - -   20: 40-case discussed with orthopedics, Dr. Aline Brochure who will evaluate the patient in his office, in a few days.  8:52 PM Reevaluation with update and discussion. After initial assessment and treatment, an updated evaluation reveals no further complaints.  Findings discussed with patient and wife, all questions answered. Netra Postlethwait L      Final Clinical Impressions(s) / ED Diagnoses   Final diagnoses:  Injury of left knee, initial encounter  Rupture of left patellar tendon, initial encounter   Left knee injury, able to extend knee, with physical findings consistent with patellar tendon rupture.  No significant knee effusion, doubt internal derangement. Doubt  Fracture.  Nursing Notes Reviewed/ Care Coordinated Applicable Imaging Reviewed Interpretation of Laboratory Data incorporated into ED treatment  The patient appears reasonably screened and/or stabilized for discharge and  I doubt any other medical condition or other [facility] requiring further screening, evaluation, or treatment in the ED at this time prior to discharge.  Plan: Home Medications-continue usual medications; Home Treatments-rest, elevation, knee immobilizer whenever up; return here if the recommended treatment, does not improve the symptoms; Recommended follow up-orthopedic follow-up 3 days.   New Prescriptions New Prescriptions   HYDROCODONE-ACETAMINOPHEN (NORCO) 5-325 MG TABLET    Take 1 tablet by mouth every 4 (four) hours as needed for moderate pain.   HYDROCODONE-ACETAMINOPHEN (NORCO/VICODIN) 5-325 MG TABLET    Take 2 tablets by mouth every 4 (four) hours as needed.     Daleen Bo, MD 02/21/17 [phone]

## 2017-02-21 NOTE — ED Triage Notes (Signed)
Pt was walking his pit bull and he got tripped up on the leash and he heard a loud pop in his left. Pt reports he already has a bad left knee.

## 2017-02-25 ENCOUNTER — Encounter: Payer: Self-pay | Admitting: Orthopedic Surgery

## 2017-02-25 ENCOUNTER — Ambulatory Visit (INDEPENDENT_AMBULATORY_CARE_PROVIDER_SITE_OTHER): Payer: Medicare Other | Admitting: Orthopedic Surgery

## 2017-02-25 VITALS — BP 172/86 | HR 63 | Ht 75.0 in | Wt 209.0 lb

## 2017-02-25 DIAGNOSIS — S86812A Strain of other muscle(s) and tendon(s) at lower leg level, left leg, initial encounter: Secondary | ICD-10-CM | POA: Diagnosis not present

## 2017-02-25 DIAGNOSIS — M25562 Pain in left knee: Secondary | ICD-10-CM | POA: Diagnosis not present

## 2017-02-25 MED ORDER — HYDROCODONE-ACETAMINOPHEN 5-325 MG PO TABS: 1.0000 | ORAL_TABLET | Freq: Four times a day (QID) | ORAL | 0 refills | Status: DC | PRN

## 2017-02-25 NOTE — Patient Instructions (Signed)
Your MRI is scheduled for Friday Nov 2nd at 7 pm, arrive at 6:45pm  at [facility]

## 2017-02-25 NOTE — Progress Notes (Signed)
NEW PATIENT OFFICE VISIT    Chief Complaint  Patient presents with  . New Patient (Initial Visit)    Twisting injury to left knee. 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  He presented to the ER.  There was an x-ray obtained.  It shows some degenerative changes in the knee the patella is actually riding in its normal position but he was diagnosed with a patellar tendon rupture  He complains of severe dull constant aching pain over the left knee joint primarily over the patella tendon and peripatellar structures    Review of Systems  Constitutional: Negative for fever.  Respiratory: Negative for shortness of breath.   Cardiovascular: Negative for chest pain.  Neurological: Negative for tingling.     Past Medical History:  Diagnosis Date  . Arthritis   . Chronic back pain    Forklift injury  . Essential hypertension, benign   . GERD (gastroesophageal reflux disease)   . History of medication noncompliance     Past Surgical History:  Procedure Laterality Date  . COLONOSCOPY N/A 07/06/2012   VEL:FYBOFB mucosa in the terminal ileum/Moderate sized internal hemorrhoids  . ESOPHAGOGASTRODUODENOSCOPY N/A 04/08/2013   PZW:CHENI gastric ulcer/duodenal inflammation. 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 Left knee surgery    . Right arm surgery      Family History  Problem Relation Age of Onset  . Heart disease Father    Social History  Substance Use Topics  . Smoking status: Current Every Day Smoker    Packs/day: 0.25    Years: 40.00    Types: Cigarettes  . Smokeless tobacco: Never Used     Comment: Smokes about 4 cigarettes daily  . Alcohol use 0.0 oz/week     Comment: 12 pk beer within a day, not every day.      Current Meds  Medication Sig  .  HYDROcodone-acetaminophen (NORCO) 5-325 MG tablet Take 1 tablet by mouth every 4 (four) hours as needed for moderate pain.    BP (!) 172/86   Pulse 63   Ht 6\' 3"  (1.905 m)   Wt 209 lb (94.8 kg)   BMI 26.12 kg/m   Physical Exam  Constitutional: He is oriented to person, place, and time. He appears well-developed and well-nourished.  Vital signs have been reviewed and are stable. Gen. appearance the patient is well-developed and well-nourished with normal grooming and hygiene.   Musculoskeletal:  Gait pattern he is limping with a cane and a knee brace but he can walk in that manner  Neurological: He is alert and oriented to person, place, and time. No sensory deficit.  Skin: Skin is warm and dry. No erythema.  Psychiatric: He has a normal mood and affect.  Vitals reviewed.   Left Knee Exam   Comments:  The patient does not have active knee extension  He has tenderness around the patella and the patellar tendon feels soft although I could not palpate a defect  Anterior cruciate ligament and PCL were tested but exam is unreliable because of pain he did have. Retinacular tenderness and pain. His skin was warm dry and intact good distal neurovascular function    Right Knee Exam   Tenderness  None  Range of Motion  Normal  right knee ROM  Muscle Strength  Normal right knee strength  Tests  Drawer:       Anterior - Negative        Medical decision making   Meds ordered this encounter  Medications  . HYDROcodone-acetaminophen (NORCO) 5-325 MG tablet    Sig: Take 1 tablet by mouth every 6 (six) hours as needed for moderate pain.    Dispense:  30 tablet    Refill:  0    Encounter Diagnoses  Name Primary?  . Acute pain of left knee   . Rupture of left patellar tendon, initial encounter Yes   I reviewed the x-ray and my interpretation is:  review I reviewed the x-ray and my interpretation isThe x-ray does not show a high riding patella does show some arthritis  Her  report is in the imaging section and can be foray does not show high riding patella does show some arthritis  Her report is in the imaging section and can be referenced forxT-rhe review  PLAN:   Mri left knee To rule out patellar tendon rupture  I discussed with him The possibility of repair left patellar tendon  The procedure has been fully reviewed with the patient; The risks and benefits of surgery have been discussed and explained and understood. Alternative treatment has also been reviewed, questions were encouraged and answered. The postoperative plan is also been reviewed.

## 2017-02-27 ENCOUNTER — Ambulatory Visit (HOSPITAL_COMMUNITY)
Admission: RE | Admit: 2017-02-27 | Discharge: 2017-02-27 | Disposition: A | Discharge status: Home / Self Care | Payer: Medicare Other | Source: Ambulatory Visit | Attending: Orthopedic Surgery | Admitting: Orthopedic Surgery

## 2017-02-27 DIAGNOSIS — S76112A Strain of left quadriceps muscle, fascia and tendon, initial encounter: Secondary | ICD-10-CM | POA: Insufficient documentation

## 2017-02-27 DIAGNOSIS — Z9889 Other specified postprocedural states: Secondary | ICD-10-CM | POA: Diagnosis not present

## 2017-02-27 DIAGNOSIS — M7122 Synovial cyst of popliteal space [Baker], left knee: Secondary | ICD-10-CM | POA: Diagnosis not present

## 2017-02-27 DIAGNOSIS — X58XXXA Exposure to other specified factors, initial encounter: Secondary | ICD-10-CM | POA: Diagnosis not present

## 2017-02-27 DIAGNOSIS — M25462 Effusion, left knee: Secondary | ICD-10-CM | POA: Diagnosis not present

## 2017-02-27 DIAGNOSIS — M25562 Pain in left knee: Secondary | ICD-10-CM

## 2017-02-27 DIAGNOSIS — R600 Localized edema: Secondary | ICD-10-CM | POA: Diagnosis not present

## 2017-02-28 ENCOUNTER — Other Ambulatory Visit: Payer: Self-pay | Admitting: Orthopedic Surgery

## 2017-03-02 ENCOUNTER — Telehealth: Payer: Self-pay | Admitting: Radiology

## 2017-03-02 ENCOUNTER — Ambulatory Visit: Payer: Self-pay | Admitting: Orthopedic Surgery

## 2017-03-02 ENCOUNTER — Telehealth: Payer: Self-pay | Admitting: Orthopedic Surgery

## 2017-03-02 NOTE — Telephone Encounter (Signed)
-----   Message from Josue Hector sent at 03/02/2017  8:54 AM EST ----- Regarding: surgery Pre op 11/7 @ 9:00, surgery 11/8 @ 11:30  I have left a message for the patient about his pre op appointment.

## 2017-03-02 NOTE — Telephone Encounter (Signed)
I spoke to patient about his preop appointment and surgery time, he has voiced understanding.

## 2017-03-02 NOTE — Telephone Encounter (Signed)
IMPRESSION: 1. Ruptured quadriceps tendon with mild patella baja and serpentine patellar tendon. These suprapatellar bursa communicates with the subcutaneous tissues. There is an interesting variant quadriceps tendon where part of the vastus lateralis tendon bypasses the patella and attaches directly to form a lateral portion of the patellar tendon ; this small portion of the tendon is intact. 2. Joint effusion and prepatellar edema. 3. Postoperative findings in the medial meniscus. 4. Mild free edge truncation in the posterior horn lateral meniscus could represent a small free edge tear. There is degeneration in the body of the adjacent lateral meniscus, predisposing factor may be the articular spurring in the lateral femoral condyles and the corresponding site. 5. Tricompartmental moderate degenerative chondral thinning. 6. Small Baker's cyst. 7. Small septated fluid collection along the anterior distal margin of the ACL could reflect intra articular ganglion or ACL cyst.     Electronically Signed   By: Van Clines M.D.   On: 02/28/2017 12:55

## 2017-03-03 NOTE — Patient Instructions (Addendum)
TAAVI HOOSE  03/03/2017     @PREFPERIOPPHARMACY @   Your procedure is scheduled on 03/05/2017   Report to [facility] at  1000   A.M.  Call this number if you have problems the morning of surgery:  [phone]   Remember:  Do not eat food or drink liquids after midnight.  Take these medicines the morning of surgery with A SIP OF WATER  Hydrocodone.   Do not wear jewelry, make-up or nail polish.  Do not wear lotions, powders, or perfumes, or deoderant.  Do not shave 48 hours prior to surgery.  Men may shave face and neck.  Do not bring valuables to the hospital.  [facility] is not responsible for any belongings or valuables.  Contacts, dentures or bridgework may not be worn into surgery.  Leave your suitcase in the car.  After surgery it may be brought to your room.  For patients admitted to the hospital, discharge time will be determined by your treatment team.  Patients discharged the day of surgery will not be allowed to drive home.   Name and phone number of your driver:   family Special instructions:  None  Please read over the following fact sheets that you were given. Anesthesia Post-op Instructions and Care and Recovery After Surgery       Incision Care, Adult An incision is a cut that a doctor makes in your skin for surgery (for a procedure). Most times, these cuts are closed after surgery. Your cut from surgery may be closed with stitches (sutures), staples, skin glue, or skin tape (adhesive strips). You may need to return to your doctor to have stitches or staples taken out. This may happen many days or many weeks after your surgery. The cut needs to be well cared for so it does not get infected. How to care for your cut Cut care  Follow instructions from your doctor about how to take care of your cut. Make sure you: ? Wash your hands with soap and water before you change your bandage (dressing). If you cannot use soap and water, use hand  sanitizer. ? Change your bandage as told by your doctor. ? Leave stitches, skin glue, or skin tape in place. They may need to stay in place for 2 weeks or longer. If tape strips get loose and curl up, you may trim the loose edges. Do not remove tape strips completely unless your doctor says it is okay.  Check your cut area every day for signs of infection. Check for: ? More redness, swelling, or pain. ? More fluid or blood. ? Warmth. ? Pus or a bad smell.  Ask your doctor how to clean the cut. This may include: ? Using mild soap and water. ? Using a clean towel to pat the cut dry after you clean it. ? Putting a cream or ointment on the cut. Do this only as told by your doctor. ? Covering the cut with a clean bandage.  Ask your doctor when you can leave the cut uncovered.  Do not take baths, swim, or use a hot tub until your doctor says it is okay. Ask your doctor if you can take showers. You may only be allowed to take sponge baths for bathing. Medicines  If you were prescribed an antibiotic medicine, cream, or ointment, take the antibiotic or put it on the cut as told by your doctor. Do not stop  taking or putting on the antibiotic even if your condition gets better.  Take over-the-counter and prescription medicines only as told by your doctor. General instructions  Limit movement around your cut. This helps healing. ? Avoid straining, lifting, or exercise for the first month, or for as long as told by your doctor. ? Follow instructions from your doctor about going back to your normal activities. ? Ask your doctor what activities are safe.  Protect your cut from the sun when you are outside for the first 6 months, or for as long as told by your doctor. Put on sunscreen around the scar or cover up the scar.  Keep all follow-up visits as told by your doctor. This is important. Contact a doctor if:  Your have more redness, swelling, or pain around the cut.  You have more fluid or  blood coming from the cut.  Your cut feels warm to the touch.  You have pus or a bad smell coming from the cut.  You have a fever or shaking chills.  You feel sick to your stomach (nauseous) or you throw up (vomit).  You are dizzy.  Your stitches or staples come undone. Get help right away if:  You have a red streak coming from your cut.  Your cut bleeds through the bandage and the bleeding does not stop with gentle pressure.  The edges of your cut open up and separate.  You have very bad (severe) pain.  You have a rash.  You are confused.  You pass out (faint).  You have trouble breathing and you have a fast heartbeat. This information is not intended to replace advice given to you by your health care provider. Make sure you discuss any questions you have with your health care provider. Document Released: 07/07/2011 Document Revised: 12/21/2015 Document Reviewed: 12/21/2015 Elsevier Interactive Patient Education  2017 [employer]  Quadriceps Tendon Tear or Disruption A quadriceps tendon tear or disruption is a partial or complete tear of the tendon between the quadriceps muscles and the kneecap (patella). Tendons connect muscles to bone. The quadriceps muscles are located on the front of the thigh and are primarily used in straightening the knee. With a partial tear, the tendon is overstretched and some of the fibers are frayed. With a complete tear, the quadriceps muscle is detached from the kneecap. This is very rare. What are the causes? This condition can be caused by trauma, such as:  A deep cut on your thigh that injures the tendon.  Falling on your knee, which may result in breaking your patella.  The condition can also occur if you land from a jump flat on your foot with your knee bent, causing a quick and forceful tightening (contraction) of your quadriceps. What increases the risk? The following factors may make you more likely to develop this  condition:  Participating in: ? Activities that involve jumping, such as basketball. ? Activities in which your knee muscles contract suddenly and forcefully, such as doing jumps or moguls in downhill skiing.  Having a weakened tendon from: ? Prolonged (chronic) quadriceps tendinitis. ? Long periods of not moving your knee (immobilization). ? Repeated corticosteroid injections into the quadriceps tendon. ? Medical conditions such as diabetes, lupus, or rheumatoid arthritis. ? Degeneration over time. Most quadriceps tendon tears occur in males over 91 years of age.  What are the signs or symptoms? Symptoms of this condition include:  Hearing a "pop" sound or feeling a tear above your patella at the  time of injury.  Pain and tenderness over your thigh. The pain may get worse when you use the quadriceps muscles.  Bruising.  Difficulty walking, or a feeling of the knee giving way.  A sagging kneecap or an indentation above your kneecap.  Not being able to straighten your knee.  How is this diagnosed? This condition may be diagnosed based on:  Your symptoms and medical history.  A physical exam. During the exam, your health care provider will: ? Feel the area above your kneecap. ? Test the motion and strength of your knee.  Imaging tests to rule out other conditions and to confirm the diagnosis. These may include: ? X-rays to check for a bone injury, such as a fracture. ? Ultrasound or MRI to look at the muscles and tendons around your knee.  How is this treated? This condition may be treated with:  Medicines to help reduce pain and inflammation.  RICE therapy. This includes resting, icing, applying compression, and raising (elevating) the injured area.  A knee brace (immobilizer) to keep the knee straight while the tendon heals. Typically, the brace will be worn for about 6 weeks.  Crutches to keep weight off of your injured leg.  Physical therapy to improve strength  and flexibility.  If the injury involves a complete tear of the tendon, surgery is usually needed. Follow these instructions at home: [place] the injured leg.  If directed, put ice on the injured area: ? Put ice in a plastic bag. ? Place a towel between your skin and the bag. ? Leave the ice on for 20 minutes, 2-3 times a day.  Apply a compression bandage to the area as told by your health care provider.  Elevate the injured area above the level of your heart while you are sitting or lying down. If You Have a Knee Brace:  Wear the brace as told by your health care provider. Remove it only as told by your health care provider.  Loosen the brace if your toes tingle, become numb, or turn cold and blue.  Do not let your brace get wet if it is not waterproof.  Keep the brace clean. Activity  Return to your normal activities as told by your health care provider. Ask your health care provider what activities are safe for you.  Do not use the injured limb to support your body weight until your health care provider says that you can. Use crutches as told by your health care provider.  Do exercises as told by your health care provider. General instructions  Take over-the-counter and prescription medicines only as told by your health care provider.  Keep all follow-up visits as told by your health care provider. This is important. How is this prevented?  Give your body time to rest between periods of activity.  Be safe and responsible while being active to avoid falls. Contact a health care provider if:  Your pain and swelling continue or get worse, even with treatment and rest.  You are unable to walk or stand without your knee feeling like it will give way. Get help right away if:  You are unable to straighten your knee from a bent position. This information is not intended to replace advice given to you by your health care provider. Make sure you discuss any  questions you have with your health care provider. Document Released: 04/14/2005 Document Revised: 12/18/2015 Document Reviewed: 03/27/2015 Elsevier Interactive Patient Education  2018 [place]  Anesthesia, Adult General anesthesia is the use of medicines to make a person "go to sleep" (be unconscious) for a medical procedure. General anesthesia is often recommended when a procedure:  Is long.  Requires you to be still or in an unusual position.  Is major and can cause you to lose blood.  Is impossible to do without general anesthesia.  The medicines used for general anesthesia are called general anesthetics. In addition to making you sleep, the medicines:  Prevent pain.  Control your blood pressure.  Relax your muscles.  Tell a health care provider about:  Any allergies you have.  All medicines you are taking, including vitamins, herbs, eye drops, creams, and over-the-counter medicines.  Any problems you or family members have had with anesthetic medicines.  Types of anesthetics you have had in the past.  Any bleeding disorders you have.  Any surgeries you have had.  Any medical conditions you have.  Any history of heart or lung conditions, such as heart failure, sleep apnea, or chronic obstructive pulmonary disease (COPD).  Whether you are pregnant or may be pregnant.  Whether you use tobacco, alcohol, marijuana, or street drugs.  Any history of Armed forces logistics/support/administrative officer.  Any history of depression or anxiety. What are the risks? Generally, this is a safe procedure. However, problems may occur, including:  Allergic reaction to anesthetics.  Lung and heart problems.  Inhaling food or liquids from your stomach into your lungs (aspiration).  Injury to nerves.  Waking up during your procedure and being unable to move (rare).  Extreme agitation or a state of mental confusion (delirium) when you wake up from the anesthetic.  Air in the bloodstream,  which can lead to stroke.  These problems are more likely to develop if you are having a major surgery or if you have an advanced medical condition. You can prevent some of these complications by answering all of your health care provider's questions thoroughly and by following all pre-procedure instructions. General anesthesia can cause side effects, including:  Nausea or vomiting  A sore throat from the breathing tube.  Feeling cold or shivery.  Feeling tired, washed out, or achy.  Sleepiness or drowsiness.  Confusion or agitation.  What happens before the procedure? Staying hydrated Follow instructions from your health care provider about hydration, which may include:  Up to 2 hours before the procedure - you may continue to drink clear liquids, such as water, clear fruit juice, black coffee, and plain tea.  Eating and drinking restrictions Follow instructions from your health care provider about eating and drinking, which may include:  8 hours before the procedure - stop eating heavy meals or foods such as meat, fried foods, or fatty foods.  6 hours before the procedure - stop eating light meals or foods, such as toast or cereal.  6 hours before the procedure - stop drinking milk or drinks that contain milk.  2 hours before the procedure - stop drinking clear liquids.  Medicines  Ask your health care provider about: ? Changing or stopping your regular medicines. This is especially important if you are taking diabetes medicines or blood thinners. ? Taking medicines such as aspirin and ibuprofen. These medicines can thin your blood. Do not take these medicines before your procedure if your health care provider instructs you not to. ? Taking new dietary supplements or medicines. Do not take these during the week before your procedure unless your health care provider approves them.  If you are told to take  a medicine or to continue taking a medicine on the day of the  procedure, take the medicine with sips of water. General instructions   Ask if you will be going home the same day, the following day, or after a longer hospital stay. ? Plan to have someone take you home. ? Plan to have someone stay with you for the first 24 hours after you leave the hospital or clinic.  For 3-6 weeks before the procedure, try not to use any tobacco products, such as cigarettes, chewing tobacco, and e-cigarettes.  You may brush your teeth on the morning of the procedure, but make sure to spit out the toothpaste. What happens during the procedure?  You will be given anesthetics through a mask and through an IV tube in one of your veins.  You may receive medicine to help you relax (sedative).  As soon as you are asleep, a breathing tube may be used to help you breathe.  An anesthesia specialist will stay with you throughout the procedure. He or she will help keep you comfortable and safe by continuing to give you medicines and adjusting the amount of medicine that you get. He or she will also watch your blood pressure, pulse, and oxygen levels to make sure that the anesthetics do not cause any problems.  If a breathing tube was used to help you breathe, it will be removed before you wake up. The procedure may vary among health care providers and hospitals. What happens after the procedure?  You will wake up, often slowly, after the procedure is complete, usually in a recovery area.  Your blood pressure, heart rate, breathing rate, and blood oxygen level will be monitored until the medicines you were given have worn off.  You may be given medicine to help you calm down if you feel anxious or agitated.  If you will be going home the same day, your health care provider may check to make sure you can stand, drink, and urinate.  Your health care providers will treat your pain and side effects before you go home.  Do not drive for 24 hours if you received a  sedative.  You may: ? Feel nauseous and vomit. ? Have a sore throat. ? Have mental slowness. ? Feel cold or shivery. ? Feel sleepy. ? Feel tired. ? Feel sore or achy, even in parts of your body where you did not have surgery. This information is not intended to replace advice given to you by your health care provider. Make sure you discuss any questions you have with your health care provider. Document Released: 07/22/2007 Document Revised: 09/25/2015 Document Reviewed: 03/29/2015 Elsevier Interactive Patient Education  2018 [place] Anesthesia, Adult, Care After These instructions provide you with information about caring for yourself after your procedure. Your health care provider may also give you more specific instructions. Your treatment has been planned according to current medical practices, but problems sometimes occur. Call your health care provider if you have any problems or questions after your procedure. What can I expect after the procedure? After the procedure, it is common to have:  Vomiting.  A sore throat.  Mental slowness.  It is common to feel:  Nauseous.  Cold or shivery.  Sleepy.  Tired.  Sore or achy, even in parts of your body where you did not have surgery.  Follow these instructions at home: For at least 24 hours after the procedure:  Do not: ? Participate in activities where you could  fall or become injured. ? Drive. ? Use heavy machinery. ? Drink alcohol. ? Take sleeping pills or medicines that cause drowsiness. ? Make important decisions or sign legal documents. ? Take care of children on your own.  Rest. Eating and drinking  If you vomit, drink water, juice, or soup when you can drink without vomiting.  Drink enough fluid to keep your urine clear or pale yellow.  Make sure you have little or no nausea before eating solid foods.  Follow the diet recommended by your health care provider. General instructions  Have a  responsible adult stay with you until you are awake and alert.  Return to your normal activities as told by your health care provider. Ask your health care provider what activities are safe for you.  Take over-the-counter and prescription medicines only as told by your health care provider.  If you smoke, do not smoke without supervision.  Keep all follow-up visits as told by your health care provider. This is important. Contact a health care provider if:  You continue to have nausea or vomiting at home, and medicines are not helpful.  You cannot drink fluids or start eating again.  You cannot urinate after 8-12 hours.  You develop a skin rash.  You have fever.  You have increasing redness at the site of your procedure. Get help right away if:  You have difficulty breathing.  You have chest pain.  You have unexpected bleeding.  You feel that you are having a life-threatening or urgent problem. This information is not intended to replace advice given to you by your health care provider. Make sure you discuss any questions you have with your health care provider. Document Released: 07/21/2000 Document Revised: 09/17/2015 Document Reviewed: 03/29/2015 Elsevier Interactive Patient Education  2018 [place] POST-ANESTHESIA  IMMEDIATELY FOLLOWING SURGERY:  Do not drive or operate machinery for the first twenty four hours after surgery.  Do not make any important decisions for twenty four hours after surgery or while taking narcotic pain medications or sedatives.  If you develop intractable nausea and vomiting or a severe headache please notify your doctor immediately.  FOLLOW-UP:  Please make an appointment with your surgeon as instructed. You do not need to follow up with anesthesia unless specifically instructed to do so.  WOUND CARE INSTRUCTIONS (if applicable):  Keep a dry clean dressing on the anesthesia/puncture wound site if there is drainage.  Once  the wound has quit draining you may leave it open to air.  Generally you should leave the bandage intact for twenty four hours unless there is drainage.  If the epidural site drains for more than 36-48 hours please call the anesthesia department.  QUESTIONS?:  Please feel free to call your physician or the hospital operator if you have any questions, and they will be happy to assist you.

## 2017-03-04 ENCOUNTER — Other Ambulatory Visit: Payer: Self-pay

## 2017-03-04 ENCOUNTER — Encounter (HOSPITAL_COMMUNITY)
Admission: RE | Admit: 2017-03-04 | Discharge: 2017-03-04 | Disposition: A | Discharge status: Home / Self Care | Payer: Medicare Other | Source: Ambulatory Visit | Attending: Orthopedic Surgery | Admitting: Orthopedic Surgery

## 2017-03-04 ENCOUNTER — Encounter (HOSPITAL_COMMUNITY): Payer: Self-pay

## 2017-03-04 DIAGNOSIS — Y998 Other external cause status: Secondary | ICD-10-CM | POA: Diagnosis not present

## 2017-03-04 DIAGNOSIS — Z8719 Personal history of other diseases of the digestive system: Secondary | ICD-10-CM | POA: Diagnosis not present

## 2017-03-04 DIAGNOSIS — M7122 Synovial cyst of popliteal space [Baker], left knee: Secondary | ICD-10-CM | POA: Diagnosis not present

## 2017-03-04 DIAGNOSIS — F17210 Nicotine dependence, cigarettes, uncomplicated: Secondary | ICD-10-CM | POA: Diagnosis not present

## 2017-03-04 DIAGNOSIS — G8929 Other chronic pain: Secondary | ICD-10-CM | POA: Diagnosis not present

## 2017-03-04 DIAGNOSIS — M25462 Effusion, left knee: Secondary | ICD-10-CM | POA: Diagnosis not present

## 2017-03-04 DIAGNOSIS — Y93K1 Activity, walking an animal: Secondary | ICD-10-CM | POA: Diagnosis not present

## 2017-03-04 DIAGNOSIS — M549 Dorsalgia, unspecified: Secondary | ICD-10-CM | POA: Diagnosis not present

## 2017-03-04 DIAGNOSIS — X501XXA Overexertion from prolonged static or awkward postures, initial encounter: Secondary | ICD-10-CM | POA: Diagnosis not present

## 2017-03-04 DIAGNOSIS — M1990 Unspecified osteoarthritis, unspecified site: Secondary | ICD-10-CM | POA: Diagnosis not present

## 2017-03-04 DIAGNOSIS — I10 Essential (primary) hypertension: Secondary | ICD-10-CM | POA: Diagnosis not present

## 2017-03-04 DIAGNOSIS — S76112A Strain of left quadriceps muscle, fascia and tendon, initial encounter: Secondary | ICD-10-CM | POA: Diagnosis not present

## 2017-03-04 DIAGNOSIS — K219 Gastro-esophageal reflux disease without esophagitis: Secondary | ICD-10-CM | POA: Diagnosis not present

## 2017-03-04 DIAGNOSIS — Z8711 Personal history of peptic ulcer disease: Secondary | ICD-10-CM | POA: Diagnosis not present

## 2017-03-04 LAB — CBC WITH DIFFERENTIAL/PLATELET
Basophils Absolute: 0 10*3/uL (ref 0.0–0.1)
Basophils Relative: 1 %
Eosinophils Absolute: 0.1 10*3/uL (ref 0.0–0.7)
Eosinophils Relative: 1 %
HCT: 44.9 % (ref 39.0–52.0)
Hemoglobin: 15.4 g/dL (ref 13.0–17.0)
Lymphocytes Relative: 37 %
Lymphs Abs: 2.4 10*3/uL (ref 0.7–4.0)
MCH: 31.2 pg (ref 26.0–34.0)
MCHC: 34.3 g/dL (ref 30.0–36.0)
MCV: 91.1 fL (ref 78.0–100.0)
Monocytes Absolute: 1.1 10*3/uL — ABNORMAL HIGH (ref 0.1–1.0)
Monocytes Relative: 16 %
Neutro Abs: 3 10*3/uL (ref 1.7–7.7)
Neutrophils Relative %: 45 %
Platelets: 261 10*3/uL (ref 150–400)
RBC: 4.93 MIL/uL (ref 4.22–5.81)
RDW: 13.4 % (ref 11.5–15.5)
WBC: 6.5 10*3/uL (ref 4.0–10.5)

## 2017-03-04 LAB — BASIC METABOLIC PANEL
Anion gap: 8 (ref 5–15)
BUN: 12 mg/dL (ref 6–20)
CO2: 26 mmol/L (ref 22–32)
Calcium: 9 mg/dL (ref 8.9–10.3)
Chloride: 100 mmol/L — ABNORMAL LOW (ref 101–111)
Creatinine, Ser: 1.04 mg/dL (ref 0.61–1.24)
GFR calc Af Amer: >60 mL/min (ref >60)
GFR calc non Af Amer: >60 mL/min (ref >60)
Glucose, Bld: 132 mg/dL — ABNORMAL HIGH (ref 65–99)
Potassium: 3.9 mmol/L (ref 3.5–5.1)
Sodium: 134 mmol/L — ABNORMAL LOW (ref 135–145)

## 2017-03-04 LAB — SURGICAL PCR SCREEN
MRSA, PCR: NEGATIVE
Staphylococcus aureus: NEGATIVE

## 2017-03-04 NOTE — H&P (Signed)
NEW PATIENT OFFICE VISIT      Chief Complaint  Patient presents with  . New Patient (Initial Visit)      Twisting injury to left knee. 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  He presented to the ER.  There was an x-ray obtained.  It shows some degenerative changes in the knee the patella is actually riding in its normal position but he was diagnosed with a patellar tendon rupture   He complains of severe dull constant aching pain over the left knee joint primarily over the patella tendon and peripatellar structures       Review of Systems  Constitutional: Negative for fever.  Respiratory: Negative for shortness of breath.   Cardiovascular: Negative for chest pain.  Neurological: Negative for tingling.            Past Medical History:  Diagnosis Date  . Arthritis    . Chronic back pain      Forklift injury  . Essential hypertension, benign    . GERD (gastroesophageal reflux disease)    . History of medication noncompliance             Past Surgical History:  Procedure Laterality Date  . COLONOSCOPY N/A 07/06/2012    HCW:CBJSEG mucosa in the terminal ileum/Moderate sized internal hemorrhoids  . ESOPHAGOGASTRODUODENOSCOPY N/A 04/08/2013    BTD:VVOHY gastric ulcer/duodenal inflammation. 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 Left knee surgery      . Right arm surgery               Family History  Problem Relation Age of Onset  . Heart disease Father             Social History  Substance Use Topics  . Smoking status: Current Every Day Smoker      Packs/day: 0.25      Years: 40.00      Types: Cigarettes  . Smokeless tobacco: Never Used        Comment: Smokes about 4 cigarettes daily  . Alcohol use 0.0 oz/week         Comment: 12 pk beer within a  day, not every day.         Active Medications      Current Meds  Medication Sig  . HYDROcodone-acetaminophen (NORCO) 5-325 MG tablet Take 1 tablet by mouth every 4 (four) hours as needed for moderate pain.        BP (!) 172/86   Pulse 63   Ht 6\' 3"  (1.905 m)   Wt 209 lb (94.8 kg)   BMI 26.12 kg/m    Physical Exam  Constitutional: He is oriented to person, place, and time. He appears well-developed and well-nourished.  Vital signs have been reviewed and are stable. Gen. appearance the patient is well-developed and well-nourished with normal grooming and hygiene.   Musculoskeletal:  Gait pattern he is limping with a cane and a knee brace but he can walk in that manner  Neurological: He is alert and oriented to person, place, and time. No sensory deficit.  Skin: Skin is warm and dry. No erythema.  Psychiatric: He has a normal mood and affect.  Vitals reviewed.     Left Knee Exam  Comments:  The patient does not have active knee extension   He has tenderness around the patella and the patellar tendon feels soft although I could not palpate a defect   Anterior cruciate ligament and PCL were tested but exam is unreliable because of pain he did have. Retinacular tenderness and pain. His skin was warm dry and intact good distal neurovascular function       Right Knee Exam    Tenderness  None   Range of Motion  Normal right knee ROM   Muscle Strength  Normal right knee strength   Tests  Drawer:       Anterior - Negative           Medical decision making        Meds ordered this encounter  Medications  . HYDROcodone-acetaminophen (NORCO) 5-325 MG tablet      Sig: Take 1 tablet by mouth every 6 (six) hours as needed for moderate pain.      Dispense:  30 tablet      Refill:  0          Encounter Diagnoses  Name Primary?  . Acute pain of left knee    . Rupture of left patellar tendon, initial encounter Yes    I reviewed the x-ray and my interpretation  is:  review I reviewed the x-ray and my interpretation isThe x-ray does not show a high riding patella does show some arthritis  IMPRESSION: 1. Ruptured quadriceps tendon with mild patella baja and serpentine patellar tendon. These suprapatellar bursa communicates with the subcutaneous tissues. There is an interesting variant quadriceps tendon where part of the vastus lateralis tendon bypasses the patella and attaches directly to form a lateral portion of the patellar tendon ; this small portion of the tendon is intact. 2. Joint effusion and prepatellar edema. 3. Postoperative findings in the medial meniscus. 4. Mild free edge truncation in the posterior horn lateral meniscus could represent a small free edge tear. There is degeneration in the body of the adjacent lateral meniscus, predisposing factor may be the articular spurring in the lateral femoral condyles and the corresponding site. 5. Tricompartmental moderate degenerative chondral thinning. 6. Small Baker's cyst. 7. Small septated fluid collection along the anterior distal margin of the ACL could reflect intra articular ganglion or ACL cyst.     Electronically Signed   By: Van Clines M.D.   On: 02/28/2017 12:55   Patient has consented for quadriceps tendon repair left knee  The procedure has been fully reviewed with the patient; The risks and benefits of surgery have been discussed and explained and understood. Alternative treatment has also been reviewed, questions were encouraged and answered. The postoperative plan is also been reviewed.

## 2017-03-05 ENCOUNTER — Ambulatory Visit (HOSPITAL_COMMUNITY)
Admission: RE | Admit: 2017-03-05 | Discharge: 2017-03-05 | Disposition: A | Discharge status: Home / Self Care | Payer: Medicare Other | Source: Ambulatory Visit | Attending: Orthopedic Surgery | Admitting: Orthopedic Surgery

## 2017-03-05 ENCOUNTER — Ambulatory Visit (HOSPITAL_COMMUNITY): Payer: Medicare Other | Admitting: Anesthesiology

## 2017-03-05 ENCOUNTER — Encounter (HOSPITAL_COMMUNITY): Payer: Self-pay | Admitting: *Deleted

## 2017-03-05 ENCOUNTER — Encounter (HOSPITAL_COMMUNITY)
Admission: RE | Disposition: A | Discharge status: Home / Self Care | Payer: Self-pay | Source: Ambulatory Visit | Attending: Orthopedic Surgery

## 2017-03-05 DIAGNOSIS — M7122 Synovial cyst of popliteal space [Baker], left knee: Secondary | ICD-10-CM | POA: Diagnosis not present

## 2017-03-05 DIAGNOSIS — K219 Gastro-esophageal reflux disease without esophagitis: Secondary | ICD-10-CM | POA: Diagnosis not present

## 2017-03-05 DIAGNOSIS — G8929 Other chronic pain: Secondary | ICD-10-CM | POA: Diagnosis not present

## 2017-03-05 DIAGNOSIS — S76112A Strain of left quadriceps muscle, fascia and tendon, initial encounter: Secondary | ICD-10-CM

## 2017-03-05 DIAGNOSIS — M1990 Unspecified osteoarthritis, unspecified site: Secondary | ICD-10-CM | POA: Insufficient documentation

## 2017-03-05 DIAGNOSIS — M25462 Effusion, left knee: Secondary | ICD-10-CM | POA: Diagnosis not present

## 2017-03-05 DIAGNOSIS — F17210 Nicotine dependence, cigarettes, uncomplicated: Secondary | ICD-10-CM | POA: Insufficient documentation

## 2017-03-05 DIAGNOSIS — S76102A Unspecified injury of left quadriceps muscle, fascia and tendon, initial encounter: Secondary | ICD-10-CM | POA: Diagnosis not present

## 2017-03-05 DIAGNOSIS — Z8711 Personal history of peptic ulcer disease: Secondary | ICD-10-CM | POA: Insufficient documentation

## 2017-03-05 DIAGNOSIS — Y998 Other external cause status: Secondary | ICD-10-CM | POA: Insufficient documentation

## 2017-03-05 DIAGNOSIS — Z8719 Personal history of other diseases of the digestive system: Secondary | ICD-10-CM | POA: Diagnosis not present

## 2017-03-05 DIAGNOSIS — X501XXA Overexertion from prolonged static or awkward postures, initial encounter: Secondary | ICD-10-CM | POA: Insufficient documentation

## 2017-03-05 DIAGNOSIS — I10 Essential (primary) hypertension: Secondary | ICD-10-CM | POA: Insufficient documentation

## 2017-03-05 DIAGNOSIS — Y93K1 Activity, walking an animal: Secondary | ICD-10-CM | POA: Insufficient documentation

## 2017-03-05 DIAGNOSIS — M549 Dorsalgia, unspecified: Secondary | ICD-10-CM | POA: Insufficient documentation

## 2017-03-05 HISTORY — PX: QUADRICEPS TENDON REPAIR: SHX756

## 2017-03-05 SURGERY — REPAIR, TENDON, QUADRICEPS
Anesthesia: General | Site: Knee | Laterality: Left

## 2017-03-05 MED ORDER — ONDANSETRON HCL 4 MG/2ML IJ SOLN
INTRAMUSCULAR | Status: AC
  Filled 2017-03-05: qty 2

## 2017-03-05 MED ORDER — GLYCOPYRROLATE 0.2 MG/ML IJ SOLN
INTRAMUSCULAR | Status: DC | PRN
  Administered 2017-03-05: .6 mg via INTRAVENOUS

## 2017-03-05 MED ORDER — ONDANSETRON HCL 4 MG/2ML IJ SOLN
4.0000 mg | Freq: Once | INTRAMUSCULAR | Status: AC
  Administered 2017-03-05: 4 mg via INTRAVENOUS

## 2017-03-05 MED ORDER — PROMETHAZINE HCL 25 MG/ML IJ SOLN
INTRAMUSCULAR | Status: AC
  Filled 2017-03-05: qty 1

## 2017-03-05 MED ORDER — CEFAZOLIN SODIUM-DEXTROSE 2-4 GM/100ML-% IV SOLN
2.0000 g | INTRAVENOUS | Status: AC
  Administered 2017-03-05: 2 g via INTRAVENOUS

## 2017-03-05 MED ORDER — LIDOCAINE HCL (PF) 1 % IJ SOLN
INTRAMUSCULAR | Status: AC
  Filled 2017-03-05: qty 5

## 2017-03-05 MED ORDER — ROCURONIUM BROMIDE 100 MG/10ML IV SOLN
INTRAVENOUS | Status: DC | PRN
  Administered 2017-03-05: 35 mg via INTRAVENOUS
  Administered 2017-03-05: 5 mg via INTRAVENOUS

## 2017-03-05 MED ORDER — CHLORHEXIDINE GLUCONATE 4 % EX LIQD: 60.0000 mL | Freq: Once | CUTANEOUS | Status: DC

## 2017-03-05 MED ORDER — FENTANYL CITRATE (PF) 100 MCG/2ML IJ SOLN
25.0000 ug | INTRAMUSCULAR | Status: DC | PRN
Start: 2017-03-05 — End: 2017-03-06
  Administered 2017-03-05 (×2): 50 ug via INTRAVENOUS
  Filled 2017-03-05 (×2): qty 2

## 2017-03-05 MED ORDER — LACTATED RINGERS IV SOLN
INTRAVENOUS | Status: DC
  Administered 2017-03-05 (×2): via INTRAVENOUS

## 2017-03-05 MED ORDER — PROPOFOL 10 MG/ML IV BOLUS
INTRAVENOUS | Status: AC
  Filled 2017-03-05: qty 20

## 2017-03-05 MED ORDER — SUCCINYLCHOLINE CHLORIDE 20 MG/ML IJ SOLN
INTRAMUSCULAR | Status: AC
  Filled 2017-03-05: qty 1

## 2017-03-05 MED ORDER — HYDROMORPHONE HCL 1 MG/ML IJ SOLN
1.0000 mg | INTRAMUSCULAR | Status: AC | PRN
  Administered 2017-03-05 (×4): 1 mg via INTRAVENOUS
  Filled 2017-03-05 (×3): qty 1

## 2017-03-05 MED ORDER — OXYCODONE HCL 5 MG PO TABS
ORAL_TABLET | ORAL | Status: AC
  Filled 2017-03-05: qty 1

## 2017-03-05 MED ORDER — MIDAZOLAM HCL 2 MG/2ML IJ SOLN
INTRAMUSCULAR | Status: AC
  Filled 2017-03-05: qty 2

## 2017-03-05 MED ORDER — GLYCOPYRROLATE 0.2 MG/ML IJ SOLN
INTRAMUSCULAR | Status: AC
  Filled 2017-03-05: qty 4

## 2017-03-05 MED ORDER — PROPOFOL 10 MG/ML IV BOLUS
INTRAVENOUS | Status: DC | PRN
  Administered 2017-03-05: 50 mg via INTRAVENOUS
  Administered 2017-03-05: 150 mg via INTRAVENOUS

## 2017-03-05 MED ORDER — BUPIVACAINE-EPINEPHRINE 0.5% -1:200000 IJ SOLN
INTRAMUSCULAR | Status: DC | PRN
Start: 2017-03-05 — End: 2017-03-05
  Administered 2017-03-05: 50 mL

## 2017-03-05 MED ORDER — LIDOCAINE HCL (CARDIAC) 10 MG/ML IV SOLN
INTRAVENOUS | Status: DC | PRN
  Administered 2017-03-05: 30 mg via INTRAVENOUS

## 2017-03-05 MED ORDER — SODIUM CHLORIDE 0.9% FLUSH
INTRAVENOUS | Status: AC
  Filled 2017-03-05: qty 10

## 2017-03-05 MED ORDER — PREGABALIN 50 MG PO CAPS
50.0000 mg | ORAL_CAPSULE | Freq: Once | ORAL | Status: AC
  Administered 2017-03-05: 50 mg via ORAL
  Filled 2017-03-05: qty 1

## 2017-03-05 MED ORDER — FENTANYL CITRATE (PF) 100 MCG/2ML IJ SOLN
INTRAMUSCULAR | Status: DC | PRN
  Administered 2017-03-05 (×5): 50 ug via INTRAVENOUS

## 2017-03-05 MED ORDER — MIDAZOLAM HCL 2 MG/2ML IJ SOLN
1.0000 mg | INTRAMUSCULAR | Status: AC
Start: 2017-03-05 — End: 2017-03-05
  Administered 2017-03-05: 2 mg via INTRAVENOUS

## 2017-03-05 MED ORDER — POVIDONE-IODINE 10 % EX SWAB: 2.0000 "application " | Freq: Once | CUTANEOUS | Status: DC

## 2017-03-05 MED ORDER — HYDROMORPHONE HCL 1 MG/ML IJ SOLN
INTRAMUSCULAR | Status: AC
  Filled 2017-03-05: qty 1

## 2017-03-05 MED ORDER — ROCURONIUM BROMIDE 50 MG/5ML IV SOLN
INTRAVENOUS | Status: AC
  Filled 2017-03-05: qty 1

## 2017-03-05 MED ORDER — NEOSTIGMINE METHYLSULFATE 10 MG/10ML IV SOLN
INTRAVENOUS | Status: DC | PRN
  Administered 2017-03-05: 3 mg via INTRAVENOUS

## 2017-03-05 MED ORDER — CEFAZOLIN SODIUM-DEXTROSE 2-4 GM/100ML-% IV SOLN
INTRAVENOUS | Status: AC
  Filled 2017-03-05: qty 100

## 2017-03-05 MED ORDER — HYDROCODONE-ACETAMINOPHEN 10-325 MG PO TABS: 1.0000 | ORAL_TABLET | ORAL | 0 refills | Status: DC | PRN

## 2017-03-05 MED ORDER — SODIUM CHLORIDE 0.9 % IR SOLN
Status: DC | PRN
  Administered 2017-03-05: 1000 mL

## 2017-03-05 MED ORDER — FENTANYL CITRATE (PF) 250 MCG/5ML IJ SOLN
INTRAMUSCULAR | Status: AC
  Filled 2017-03-05: qty 5

## 2017-03-05 MED ORDER — OXYCODONE HCL 5 MG PO TABS
5.0000 mg | ORAL_TABLET | Freq: Once | ORAL | Status: AC
  Administered 2017-03-05: 5 mg via ORAL

## 2017-03-05 MED ORDER — PROMETHAZINE HCL 25 MG/ML IJ SOLN
12.5000 mg | Freq: Once | INTRAMUSCULAR | Status: AC
  Administered 2017-03-05: 12.5 mg via INTRAVENOUS

## 2017-03-05 SURGICAL SUPPLY — 65 items
ANCH SUT 2 CRKSCW 15.5X6.5 (Anchor) ×3 IMPLANT
ANCHOR CORKSCREW 6.5 FIBERWIRE (Anchor) ×3 IMPLANT
BAG HAMPER (MISCELLANEOUS) ×2 IMPLANT
BANDAGE ELASTIC 4 LF NS (GAUZE/BANDAGES/DRESSINGS) ×2 IMPLANT
BANDAGE ELASTIC 6 LF NS (GAUZE/BANDAGES/DRESSINGS) ×2 IMPLANT
BANDAGE ESMARK 6X9 LF (GAUZE/BANDAGES/DRESSINGS) ×1 IMPLANT
BIT DRILL 2.8X128 (BIT) ×2 IMPLANT
BLADE SURG SZ10 CARB STEEL (BLADE) ×2 IMPLANT
BNDG CMPR 9X6 STRL LF SNTH (GAUZE/BANDAGES/DRESSINGS) ×1
BNDG CMPR MED 5X4 ELC HKLP NS (GAUZE/BANDAGES/DRESSINGS) ×1
BNDG CMPR MED 5X6 ELC HKLP NS (GAUZE/BANDAGES/DRESSINGS) ×1
BNDG COHESIVE 4X5 TAN STRL (GAUZE/BANDAGES/DRESSINGS) ×2 IMPLANT
BNDG ESMARK 6X9 LF (GAUZE/BANDAGES/DRESSINGS) ×2
BUR ROUND (BURR) ×1 IMPLANT
CHLORAPREP W/TINT 26ML (MISCELLANEOUS) ×2 IMPLANT
CLOTH BEACON ORANGE TIMEOUT ST (SAFETY) ×2 IMPLANT
COVER LIGHT HANDLE STERIS (MISCELLANEOUS) ×4 IMPLANT
CUFF TOURNIQUET SINGLE 34IN LL (TOURNIQUET CUFF) ×2 IMPLANT
DECANTER SPIKE VIAL GLASS SM (MISCELLANEOUS) ×4 IMPLANT
DRAPE INCISE IOBAN 44X35 STRL (DRAPES) ×2 IMPLANT
ELECT REM PT RETURN 9FT ADLT (ELECTROSURGICAL) ×2
ELECTRODE REM PT RTRN 9FT ADLT (ELECTROSURGICAL) ×1 IMPLANT
GAUZE SPONGE 4X4 12PLY STRL (GAUZE/BANDAGES/DRESSINGS) ×1 IMPLANT
GAUZE XEROFORM 5X9 LF (GAUZE/BANDAGES/DRESSINGS) ×1 IMPLANT
GLOVE BIOGEL PI IND STRL 7.0 (GLOVE) ×2 IMPLANT
GLOVE BIOGEL PI INDICATOR 7.0 (GLOVE) ×2
GLOVE SKINSENSE NS SZ8.0 LF (GLOVE) ×1
GLOVE SKINSENSE STRL SZ8.0 LF (GLOVE) ×1 IMPLANT
GLOVE SS N UNI LF 8.5 STRL (GLOVE) ×2 IMPLANT
GOWN STRL REUS W/ TWL LRG LVL3 (GOWN DISPOSABLE) ×1 IMPLANT
GOWN STRL REUS W/TWL LRG LVL3 (GOWN DISPOSABLE) ×4 IMPLANT
GOWN STRL REUS W/TWL XL LVL3 (GOWN DISPOSABLE) ×2 IMPLANT
IMMOBILIZER KNEE 19 UNV (ORTHOPEDIC SUPPLIES) ×3 IMPLANT
INST SET MAJOR BONE (KITS) ×2 IMPLANT
KIT ROOM TURNOVER APOR (KITS) ×2 IMPLANT
MANIFOLD NEPTUNE II (INSTRUMENTS) ×2 IMPLANT
NDL HYPO 21X1.5 SAFETY (NEEDLE) ×1 IMPLANT
NDL MAYO 6 CRC TAPER PT (NEEDLE) IMPLANT
NEEDLE HYPO 21X1.5 SAFETY (NEEDLE) ×2 IMPLANT
NEEDLE MAYO 6 CRC TAPER PT (NEEDLE) IMPLANT
NS IRRIG 1000ML POUR BTL (IV SOLUTION) ×2 IMPLANT
PACK BASIC LIMB (CUSTOM PROCEDURE TRAY) ×2 IMPLANT
PAD ABD 5X9 TENDERSORB (GAUZE/BANDAGES/DRESSINGS) ×2 IMPLANT
PAD ARMBOARD 7.5X6 YLW CONV (MISCELLANEOUS) ×2 IMPLANT
PAD CAST 4YDX4 CTTN HI CHSV (CAST SUPPLIES) ×1 IMPLANT
PADDING CAST COTTON 4X4 STRL (CAST SUPPLIES) ×2
PADDING CAST COTTON 6X4 STRL (CAST SUPPLIES) ×2 IMPLANT
PADDING WEBRIL 6 STERILE (GAUZE/BANDAGES/DRESSINGS) ×1 IMPLANT
PASSER SUT SWANSON 36MM LOOP (INSTRUMENTS) IMPLANT
SET BASIN LINEN APH (SET/KITS/TRAYS/PACK) ×2 IMPLANT
SPONGE LAP 18X18 X RAY DECT (DISPOSABLE) ×4 IMPLANT
STAPLER VISISTAT 35W (STAPLE) ×2 IMPLANT
SUT BRALON NAB BRD #1 30IN (SUTURE) IMPLANT
SUT ETHIBOND 5 LR DA (SUTURE) ×2 IMPLANT
SUT ETHIBOND NAB OS 4 #2 30IN (SUTURE) IMPLANT
SUT ETHILON 3 0 FSL (SUTURE) IMPLANT
SUT MNCRL 0 VIOLET CTX 36 (SUTURE) IMPLANT
SUT MON AB 0 CT1 (SUTURE) ×2 IMPLANT
SUT MON AB 2-0 CT1 36 (SUTURE) ×2 IMPLANT
SUT MONOCRYL 0 CTX 36 (SUTURE) ×2
SUT PROLENE 3 0 PS 1 (SUTURE) IMPLANT
SUT VIC AB 1 CT1 27 (SUTURE) ×2
SUT VIC AB 1 CT1 27XBRD ANTBC (SUTURE) ×1 IMPLANT
SYR 30ML LL (SYRINGE) ×2 IMPLANT
SYR BULB IRRIGATION 50ML (SYRINGE) ×2 IMPLANT

## 2017-03-05 NOTE — Op Note (Signed)
03/05/2017  11:53 AM  PATIENT:  Stephen Koch  56 y.o. male  PRE-OPERATIVE DIAGNOSIS:  left quadriceps tendon rupture  POST-OPERATIVE DIAGNOSIS:  left quadriceps tendon rupture  PROCEDURE:  Procedure(s): REPAIR QUADRICEP TENDON (Left)   Findings Rupture of the quadriceps tendon and a portion of the medial VMO musculature  Implants 3 6.5 Arthrex suture anchors with 2 sutures per anchor  Surgery was done as follows  The patient was identified in preop chart review was completed and site marking was confirmed and site was marked left knee  The patient was taken back to the operating room we gave him Ancef 2 g.  General anesthesia he was in the supine position  After sterile prep and drape the tourniquet was elevated 300 mmHg that after exsanguination of the limb with a 6 inch Esmarch  A midline incision was made subtenons tissue was divided down to the tendon of the quadriceps patella and patellar tendon  A defect was noted in the quadriceps tendon and a portion of the VMO  The patella was debrided bur was used to decorticate the bone 3 six-point 5 suture anchors were placed and then the sutures were passed and tied in the quadriceps tendon.  The knee was flexed to 75 degrees at which point tension on the sutures were noted.  Postop flexion 0-45 at 2 weeks and then advance progressively.  The tissues were infiltrated with Marcaine with epinephrine 30 cc after irrigation of the joint and the wound  0 Monocryl was placed in the subcutaneous tissue and staples were used to reapproximate the skin  Sterile dressings and knee immobilizer were applied  Tourniquet was released prior to dressing with the Ace bandage.  Extubation patient taken recovery room stable condition  [id number]  SURGEON:  Surgeon(s) and Role:    * Carole Civil, MD - Primary  PHYSICIAN ASSISTANT:   ASSISTANTS: betty ashley    ANESTHESIA:   general  EBL:  0 mL   BLOOD  ADMINISTERED:none  DRAINS: none   LOCAL MEDICATIONS USED:  MARCAINE     SPECIMEN:  No Specimen  DISPOSITION OF SPECIMEN:  N/A  COUNTS:  YES  TOURNIQUET:   Total Tourniquet Time Documented: Thigh (Left) - 49 minutes Total: Thigh (Left) - 49 minutes   DICTATION: .Viviann Spare Dictation  PLAN OF CARE: Discharge to home after PACU  PATIENT DISPOSITION:  PACU - hemodynamically stable.   Delay start of Pharmacological VTE agent (>24hrs) due to surgical blood loss or risk of bleeding: not applicable

## 2017-03-05 NOTE — Anesthesia Procedure Notes (Signed)
Procedure Name: Intubation Date/Time: 03/05/2017 10:43 AM Performed by: Vista Deck, CRNA Pre-anesthesia Checklist: Patient identified, Patient being monitored, Timeout performed, Emergency Drugs available and Suction available Patient Re-evaluated:Patient Re-evaluated prior to induction Oxygen Delivery Method: Circle System Utilized Preoxygenation: Pre-oxygenation with 100% oxygen Induction Type: IV induction, Rapid sequence and Cricoid Pressure applied Ventilation: Mask ventilation without difficulty Laryngoscope Size: Miller and 2 Grade View: Grade I Tube type: Oral Tube size: 7.0 mm Number of attempts: 1 Airway Equipment and Method: Stylet and Oral airway Placement Confirmation: ETT inserted through vocal cords under direct vision,  positive ETCO2 and breath sounds checked- equal and bilateral Secured at: 23 cm Tube secured with: Tape Dental Injury: Teeth and Oropharynx as per pre-operative assessment

## 2017-03-05 NOTE — Anesthesia Postprocedure Evaluation (Signed)
Anesthesia Post Note  Patient: Stephen Koch  Procedure(s) Performed: REPAIR QUADRICEP TENDON (Left Knee)  Patient location during evaluation: PACU Anesthesia Type: General Level of consciousness: awake and alert Pain management: satisfactory to patient Vital Signs Assessment: post-procedure vital signs reviewed and stable Respiratory status: spontaneous breathing Cardiovascular status: stable Postop Assessment: no apparent nausea or vomiting Anesthetic complications: no     Last Vitals:  Vitals:   03/05/17 1227 03/05/17 1230  BP: (!) 176/79 (!) 182/93  Pulse: 68 65  Resp: 14 20  Temp:    SpO2: 95% 94%    Last Pain:  Vitals:   03/05/17 1230  TempSrc:   PainSc: 8                  Lyle Leisner

## 2017-03-05 NOTE — Brief Op Note (Signed)
03/05/2017  11:53 AM  PATIENT:  Stephen Koch  56 y.o. male  PRE-OPERATIVE DIAGNOSIS:  left quadriceps tendon rupture  POST-OPERATIVE DIAGNOSIS:  left quadriceps tendon rupture  PROCEDURE:  Procedure(s): REPAIR QUADRICEP TENDON (Left)   Findings Rupture of the quadriceps tendon and a portion of the medial VMO musculature  Implants 3 6.5 Arthrex suture anchors with 2 sutures per anchor  Surgery was done as follows  The patient was identified in preop chart review was completed and site marking was confirmed and site was marked left knee  The patient was taken back to the operating room we gave him Ancef 2 g.  General anesthesia he was in the supine position  After sterile prep and drape the tourniquet was elevated 300 mmHg that after exsanguination of the limb with a 6 inch Esmarch  A midline incision was made subtenons tissue was divided down to the tendon of the quadriceps patella and patellar tendon  A defect was noted in the quadriceps tendon and a portion of the VMO  The patella was debrided bur was used to decorticate the bone 3 six-point 5 suture anchors were placed and then the sutures were passed and tied in the quadriceps tendon.  The knee was flexed to 75 degrees at which point tension on the sutures were noted.  Postop flexion 0-45 at 2 weeks and then advance progressively.  The tissues were infiltrated with Marcaine with epinephrine 30 cc after irrigation of the joint and the wound  0 Monocryl was placed in the subcutaneous tissue and staples were used to reapproximate the skin  Sterile dressings and knee immobilizer were applied  Tourniquet was released prior to dressing with the Ace bandage.  Extubation patient taken recovery room stable condition  [id number]  SURGEON:  Surgeon(s) and Role:    * Carole Civil, MD - Primary  PHYSICIAN ASSISTANT:   ASSISTANTS: betty ashley    ANESTHESIA:   general  EBL:  0 mL   BLOOD  ADMINISTERED:none  DRAINS: none   LOCAL MEDICATIONS USED:  MARCAINE     SPECIMEN:  No Specimen  DISPOSITION OF SPECIMEN:  N/A  COUNTS:  YES  TOURNIQUET:   Total Tourniquet Time Documented: Thigh (Left) - 49 minutes Total: Thigh (Left) - 49 minutes   DICTATION: .Viviann Spare Dictation  PLAN OF CARE: Discharge to home after PACU  PATIENT DISPOSITION:  PACU - hemodynamically stable.   Delay start of Pharmacological VTE agent (>24hrs) due to surgical blood loss or risk of bleeding: not applicable

## 2017-03-05 NOTE — Interval H&P Note (Signed)
History and Physical Interval Note:  03/05/2017 9:27 AM  Stephen Koch  has presented today for surgery, with the diagnosis of left quadriceps tendon rupture  The various methods of treatment have been discussed with the patient and family. After consideration of risks, benefits and other options for treatment, the patient has consented to  Procedure(s): REPAIR QUADRICEP TENDON (Left) as a surgical intervention .  The patient's history has been reviewed, patient examined, no change in status, stable for surgery.  I have reviewed the patient's chart and labs.  Questions were answered to the patient's satisfaction.     [name]

## 2017-03-05 NOTE — Transfer of Care (Signed)
Immediate Anesthesia Transfer of Care Note  Patient: DEMERE DOTZLER  Procedure(s) Performed: REPAIR QUADRICEP TENDON (Left Knee)  Patient Location: PACU  Anesthesia Type:General  Level of Consciousness: awake and alert   Airway & Oxygen Therapy: Patient Spontanous Breathing  Post-op Assessment: Report given to RN and Post -op Vital signs reviewed and stable  Post vital signs: Reviewed and stable  Last Vitals:  Vitals:   03/05/17 1025 03/05/17 1030  BP:    Pulse:    Resp: 20 19  Temp:    SpO2: 96% 97%    Last Pain:  Vitals:   03/05/17 0932  TempSrc: Oral  PainSc: 8       Patients Stated Pain Goal: 8 (27/61/47 0929)  Complications: No apparent anesthesia complications

## 2017-03-05 NOTE — Anesthesia Preprocedure Evaluation (Signed)
Anesthesia Evaluation  Patient identified by MRN, date of birth, ID band Patient awake    Reviewed: Allergy & Precautions, NPO status , Patient's Chart, lab work & pertinent test results  Airway Mallampati: II  TM Distance: >3 FB     Dental  (+) Edentulous Upper, Partial Lower   Pulmonary Current Smoker,    breath sounds clear to auscultation       Cardiovascular hypertension (no meds),  Rhythm:Regular Rate:Normal     Neuro/Psych    GI/Hepatic GERD  Medicated and Controlled,(+)     substance abuse  alcohol use and marijuana use,   Endo/Other    Renal/GU      Musculoskeletal  (+) Arthritis  (chronic back pain),   Abdominal   Peds  Hematology   Anesthesia Other Findings   Reproductive/Obstetrics                             Anesthesia Physical Anesthesia Plan  ASA: III  Anesthesia Plan: General   Post-op Pain Management:    Induction: Intravenous, Rapid sequence and Cricoid pressure planned  PONV Risk Score and Plan:   Airway Management Planned: Oral ETT  Additional Equipment:   Intra-op Plan:   Post-operative Plan: Extubation in OR  Informed Consent: I have reviewed the patients History and Physical, chart, labs and discussed the procedure including the risks, benefits and alternatives for the proposed anesthesia with the patient or authorized representative who has indicated his/her understanding and acceptance.     Plan Discussed with:   Anesthesia Plan Comments:         Anesthesia Quick Evaluation

## 2017-03-06 ENCOUNTER — Encounter (HOSPITAL_COMMUNITY): Payer: Self-pay | Admitting: Orthopedic Surgery

## 2017-03-13 ENCOUNTER — Ambulatory Visit (INDEPENDENT_AMBULATORY_CARE_PROVIDER_SITE_OTHER): Payer: Self-pay | Admitting: Orthopedic Surgery

## 2017-03-13 ENCOUNTER — Emergency Department (HOSPITAL_COMMUNITY)
Admission: EM | Admit: 2017-03-13 | Discharge: 2017-03-14 | Disposition: A | Discharge status: Home / Self Care | Payer: Medicare Other | Attending: Emergency Medicine | Admitting: Emergency Medicine

## 2017-03-13 ENCOUNTER — Emergency Department (HOSPITAL_COMMUNITY): Payer: Medicare Other

## 2017-03-13 ENCOUNTER — Encounter: Payer: Self-pay | Admitting: Orthopedic Surgery

## 2017-03-13 ENCOUNTER — Other Ambulatory Visit: Payer: Self-pay

## 2017-03-13 ENCOUNTER — Encounter (HOSPITAL_COMMUNITY): Payer: Self-pay | Admitting: Emergency Medicine

## 2017-03-13 VITALS — BP 187/102 | HR 63 | Ht 75.0 in | Wt 205.0 lb

## 2017-03-13 DIAGNOSIS — M25562 Pain in left knee: Secondary | ICD-10-CM | POA: Diagnosis not present

## 2017-03-13 DIAGNOSIS — F17210 Nicotine dependence, cigarettes, uncomplicated: Secondary | ICD-10-CM | POA: Diagnosis not present

## 2017-03-13 DIAGNOSIS — S86812D Strain of other muscle(s) and tendon(s) at lower leg level, left leg, subsequent encounter: Secondary | ICD-10-CM

## 2017-03-13 DIAGNOSIS — Z79899 Other long term (current) drug therapy: Secondary | ICD-10-CM | POA: Diagnosis not present

## 2017-03-13 DIAGNOSIS — I10 Essential (primary) hypertension: Secondary | ICD-10-CM | POA: Insufficient documentation

## 2017-03-13 DIAGNOSIS — Z4889 Encounter for other specified surgical aftercare: Secondary | ICD-10-CM

## 2017-03-13 DIAGNOSIS — R600 Localized edema: Secondary | ICD-10-CM | POA: Diagnosis not present

## 2017-03-13 NOTE — ED Notes (Signed)
To radiology

## 2017-03-13 NOTE — ED Triage Notes (Signed)
Pt states he stepped wrong and fell injuring left knee. Pt states he had recent left knee replacement.

## 2017-03-13 NOTE — Discharge Instructions (Signed)
There is no new injury of your left knee based on tonights xray.  Plan to see Dr Aline Brochure as planned.

## 2017-03-13 NOTE — Patient Instructions (Signed)
10 am tues the 21st

## 2017-03-13 NOTE — ED Provider Notes (Signed)
[facility] EMERGENCY DEPARTMENT Provider Note   CSN: [id number] Arrival date & time: 03/13/17  2222     History   Chief Complaint Chief Complaint  Patient presents with  . Knee Pain    HPI Stephen Koch is a 56 y.o. male who underwent surgical repair of a ruptured left quadricep tendon 1 week ago presenting with increased left knee pain secondary to fall.  He describes tripping this evening causing a fall and increased pain in the left knee.  He was wearing his knee brace at the time, but states it was loose and allowed his knee to flex somewhat.  He reports persistent worse pain since the injury.  He is taking hydrocodone for pain, his last dose was prior to arrival here.  There is no radiation of pain beyond the knee and surgical site.  He is scheduled to follow-up with Dr. Aline Brochure in 4 days for a postop visit.  The history is provided by the patient and the spouse.    Past Medical History:  Diagnosis Date  . Arthritis   . Chronic back pain    Forklift injury  . Essential hypertension, benign   . GERD (gastroesophageal reflux disease)   . History of medication noncompliance     Patient Active Problem List   Diagnosis Date Noted  . Rupture of left quadriceps tendon   . Helicobacter pylori gastritis 04/18/2015  . ETOH abuse 04/18/2015  . Liver lesion 04/18/2015  . Essential hypertension, benign 11/04/2013  . Heart murmur 11/04/2013  . Back pain associated with peripheral numbness 08/10/2013  . Upper abdominal pain 03/31/2013  . Hematochezia 07/01/2012    Past Surgical History:  Procedure Laterality Date  . COLONOSCOPY N/A 07/06/2012   Performed by Danie Binder, MD at [place]  . ESOPHAGOGASTRODUODENOSCOPY (EGD) N/A 04/08/2013   Performed by Danie Binder, MD at [place]  . Left knee surgery Left    arthroscopy  . REPAIR QUADRICEP TENDON Left 03/05/2017   Performed by Carole Civil, MD at [facility]  . Right arm surgery     tendon repair        Home Medications    Prior to Admission medications   Medication Sig Start Date End Date Taking? Authorizing Provider  HYDROcodone-acetaminophen (NORCO) 10-325 MG tablet Take 1 tablet every 4 (four) hours as needed by mouth. 03/05/17   Carole Civil, MD  Magnesium Salicylate (DOANS PILLS PO) Take 1 tablet daily by mouth.    [provider]  Menthol, Topical Analgesic, (BENGAY EX) Apply 1 application at bedtime as needed topically (pain).    [provider]  naproxen (NAPROSYN) 500 MG tablet  03/04/17   [provider]    Family History Family History  Problem Relation Age of Onset  . Heart disease Father     Social History Social History   Tobacco Use  . Smoking status: Current Every Day Smoker    Packs/day: 0.25    Years: 40.00    Pack years: 10.00    Types: Cigarettes  . Smokeless tobacco: Never Used  . Tobacco comment: Smokes about 4 cigarettes daily  Substance Use Topics  . Alcohol use: Yes    Alcohol/week: 14.4 oz    Types: 24 Cans of beer per week  . Drug use: Yes    Types: Marijuana    Comment: Marijuana twice per week     Allergies   Ibuprofen   Review of Systems Review of  Systems  Constitutional: Negative for fever.  Musculoskeletal: Positive for arthralgias and joint swelling. Negative for myalgias.  Neurological: Negative for weakness and numbness.     Physical Exam Updated Vital Signs BP 126/79   Pulse 70   Temp 98.4 F (36.9 C)   Resp 18   Ht 6\' 3"  (1.905 m)   Wt 93 kg (205 lb)   SpO2 98%   BMI 25.62 kg/m   Physical Exam  Constitutional: He appears well-developed and well-nourished.  HENT:  Head: Atraumatic.  Neck: Normal range of motion.  Cardiovascular:  Pulses equal bilaterally  Musculoskeletal: He exhibits edema and tenderness.       Left knee: He exhibits decreased range of motion and swelling. He exhibits no deformity.  Moderate edema and tenderness to palpation of the left anterior  knee.  His surgical staples are intact with his incision edges well approximated.  No deformity appreciated.  Dorsalis pedis pulses are full.  Presents in his knee immobilizer.  Neurological: He is alert. He has normal strength. He displays normal reflexes. No sensory deficit.  Skin: Skin is warm and dry.  Psychiatric: He has a normal mood and affect.     ED Treatments / Results  Labs (all labs ordered are listed, but only abnormal results are displayed) Labs Reviewed - No data to display  EKG  EKG Interpretation None       Radiology Dg Knee Complete 4 Views Left  Result Date: 03/13/2017 CLINICAL DATA:  Initial evaluation for acute trauma, fall. Recent surgery. EXAM: LEFT KNEE - COMPLETE 4+ VIEW COMPARISON:  Prior MRI from 02/27/2017. FINDINGS: Postoperative changes from recent quadriceps tendon repair seen. Suture anchors in place at the patella. Postoperative soft tissue swelling and edema about the knee. Skin staples remain in place. Persistent joint effusion. No acute fracture. Osteoarthritic changes present about the knee. IMPRESSION: 1. No acute osseous abnormality about the knee. 2. Postoperative changes from recent quadriceps tendon repair. Electronically Signed   By: Jeannine Boga M.D.   On: 03/13/2017 23:17    Procedures Procedures (including critical care time)  Medications Ordered in ED Medications - No data to display   Initial Impression / Assessment and Plan / ED Course  I have reviewed the triage vital signs and the nursing notes.  Pertinent labs & imaging results that were available during my care of the patient were reviewed by me and considered in my medical decision making (see chart for details).     X-rays reviewed and discussed with patient.  There appears to be no injury secondary to tonight's fall.  He was encouraged to continue wearing his knee immobilizer and taking his current pain medications.  Continue ice and elevation.  Plan to see Dr.  Aline Brochure in 4 days as originally scheduled.  Final Clinical Impressions(s) / ED Diagnoses   Final diagnoses:  Acute pain of left knee    ED Discharge Orders    None       Landis Martins 03/14/17 Caprice Beaver, MD 03/14/17 [phone]

## 2017-03-13 NOTE — Progress Notes (Signed)
Encounter Diagnoses  Name Primary?  . Rupture of left patellar tendon, subsequent encounter   . Aftercare following surgery Yes    BP (!) 187/102   Pulse 63   Ht 6\' 3"  (1.905 m)   Wt 205 lb (93 kg)   BMI 25.62 kg/m   Chief Complaint  Patient presents with  . Routine Post Op    03/05/17 Quad tendon repair    Postop day 8 suture anchor repair left quadriceps tendon rupture  Suture line looks good minimal swelling distally  Ambulatory with crutch  Staples out on the 16XW application of T scope brace 0-45 degrees  Weight-bear as tolerated

## 2017-03-17 ENCOUNTER — Ambulatory Visit (INDEPENDENT_AMBULATORY_CARE_PROVIDER_SITE_OTHER): Payer: Self-pay | Admitting: Orthopedic Surgery

## 2017-03-17 ENCOUNTER — Encounter: Payer: Self-pay | Admitting: Orthopedic Surgery

## 2017-03-17 VITALS — BP 192/102 | HR 68 | Ht 75.0 in | Wt 205.0 lb

## 2017-03-17 DIAGNOSIS — S76112D Strain of left quadriceps muscle, fascia and tendon, subsequent encounter: Secondary | ICD-10-CM

## 2017-03-17 DIAGNOSIS — Z9889 Other specified postprocedural states: Secondary | ICD-10-CM

## 2017-03-17 MED ORDER — HYDROCODONE-ACETAMINOPHEN 7.5-325 MG PO TABS: 1.0000 | ORAL_TABLET | Freq: Four times a day (QID) | ORAL | 0 refills | Status: DC | PRN

## 2017-03-17 NOTE — Progress Notes (Addendum)
Chief Complaint  Patient presents with  . Routine Post Op    left quad repair 03/05/17    BP (!) 192/102   Pulse 68   Ht 6\' 3"  (1.905 m)   Wt 205 lb (93 kg)   BMI 25.62 kg/m   Staples out wound assessment: Wound clean dry and intact  Brace 0-50 advance degrees on next visit   And currently using a cane full weightbearing   Follow-up 2 weeks   PMP OD RISCK SCORE 180

## 2017-03-17 NOTE — Patient Instructions (Signed)
Wear brace when walking   Fu 2 weeks for brace adjustment

## 2017-04-01 ENCOUNTER — Encounter: Payer: Self-pay | Admitting: Orthopedic Surgery

## 2017-04-01 ENCOUNTER — Ambulatory Visit (INDEPENDENT_AMBULATORY_CARE_PROVIDER_SITE_OTHER): Payer: Self-pay | Admitting: Orthopedic Surgery

## 2017-04-01 VITALS — BP 161/95 | HR 73 | Ht 75.0 in | Wt 205.0 lb

## 2017-04-01 DIAGNOSIS — S76112D Strain of left quadriceps muscle, fascia and tendon, subsequent encounter: Secondary | ICD-10-CM

## 2017-04-01 DIAGNOSIS — Z9889 Other specified postprocedural states: Secondary | ICD-10-CM

## 2017-04-01 MED ORDER — HYDROCODONE-ACETAMINOPHEN 7.5-325 MG PO TABS: 1.0000 | ORAL_TABLET | Freq: Four times a day (QID) | ORAL | 0 refills | Status: DC | PRN

## 2017-04-01 NOTE — Progress Notes (Signed)
POST OP VISIT   Chief Complaint  Patient presents with  . Routine Post Op    LEFT QUADRICEPS TENDON REPAIR DOS 03/05/17     Encounter Diagnoses  Name Primary?  . Rupture of left quadriceps tendon, subsequent encounter 03/05/17 Yes  . Rupture of left quadriceps tendon, subsequent encounter s/p Repair 03/05/17   . H/O knee surgery      Current Outpatient Medications:  .  HYDROcodone-acetaminophen (NORCO) 7.5-325 MG tablet, Take 1 tablet by mouth every 6 (six) hours as needed for moderate pain., Disp: 30 tablet, Rfl: 0 .  Magnesium Salicylate (DOANS PILLS PO), Take 1 tablet daily by mouth., Disp: , Rfl:  .  Menthol, Topical Analgesic, (BENGAY EX), Apply 1 application at bedtime as needed topically (pain)., Disp: , Rfl:  .  naproxen (NAPROSYN) 500 MG tablet, , Disp: , Rfl:    THERE ARE NO COMPLAINTS   THE WOUND LOOKS CLEAN AND THERE ARE NO SIGNS OF ERYTHEMA  NEUROVASCULAR EXAM IS NORMAL   He was able to do straight leg raise with only 20 degree extensor lag brace advanced to 120 degrees  EVERYTHING LOOKS GOOD   FU WILL BE SCHEDULED 4 weeks  Patient can perform active range of motion exercises in the brace full weightbearing  Discuss therapy next visit

## 2017-04-01 NOTE — Patient Instructions (Signed)
Steps to Quit Smoking Smoking tobacco can be bad for your health. It can also affect almost every organ in your body. Smoking puts you and people around you at risk for many serious Stephen Koch-lasting (chronic) diseases. Quitting smoking is hard, but it is one of the best things that you can do for your health. It is never too late to quit. What are the benefits of quitting smoking? When you quit smoking, you lower your risk for getting serious diseases and conditions. They can include:  Lung cancer or lung disease.  Heart disease.  Stroke.  Heart attack.  Not being able to have children (infertility).  Weak bones (osteoporosis) and broken bones (fractures).  If you have coughing, wheezing, and shortness of breath, those symptoms may get better when you quit. You may also get sick less often. If you are pregnant, quitting smoking can help to lower your chances of having a baby of low birth weight. What can I do to help me quit smoking? Talk with your doctor about what can help you quit smoking. Some things you can do (strategies) include:  Quitting smoking totally, instead of slowly cutting back how much you smoke over a period of time.  Going to in-person counseling. You are more likely to quit if you go to many counseling sessions.  Using resources and support systems, such as: ? Online chats with a counselor. ? Phone quitlines. ? Printed self-help materials. ? Support groups or group counseling. ? Text messaging programs. ? Mobile phone apps or applications.  Taking medicines. Some of these medicines may have nicotine in them. If you are pregnant or breastfeeding, do not take any medicines to quit smoking unless your doctor says it is okay. Talk with your doctor about counseling or other things that can help you.  Talk with your doctor about using more than one strategy at the same time, such as taking medicines while you are also going to in-person counseling. This can help make  quitting easier. What things can I do to make it easier to quit? Quitting smoking might feel very hard at first, but there is a lot that you can do to make it easier. Take these steps:  Talk to your family and friends. Ask them to support and encourage you.  Call phone quitlines, reach out to support groups, or work with a counselor.  Ask people who smoke to not smoke around you.  Avoid places that make you want (trigger) to smoke, such as: ? Bars. ? Parties. ? Smoke-break areas at work.  Spend time with people who do not smoke.  Lower the stress in your life. Stress can make you want to smoke. Try these things to help your stress: ? Getting regular exercise. ? Deep-breathing exercises. ? Yoga. ? Meditating. ? Doing a body scan. To do this, close your eyes, focus on one area of your body at a time from head to toe, and notice which parts of your body are tense. Try to relax the muscles in those areas.  Download or buy apps on your mobile phone or tablet that can help you stick to your quit plan. There are many free apps, such as QuitGuide from the CDC (Centers for Disease Control and Prevention). You can find more support from smokefree.gov and other websites.  This information is not intended to replace advice given to you by your health care provider. Make sure you discuss any questions you have with your health care provider. Document Released: 02/08/2009 Document   Revised: 12/11/2015 Document Reviewed: 08/29/2014 Elsevier Interactive Patient Education  2018 Elsevier Inc.  

## 2017-04-13 ENCOUNTER — Other Ambulatory Visit: Payer: Self-pay | Admitting: Orthopedic Surgery

## 2017-04-13 ENCOUNTER — Telehealth: Payer: Self-pay | Admitting: Orthopedic Surgery

## 2017-04-13 DIAGNOSIS — Z4889 Encounter for other specified surgical aftercare: Secondary | ICD-10-CM

## 2017-04-13 MED ORDER — HYDROCODONE-ACETAMINOPHEN 5-325 MG PO TABS: 1.0000 | ORAL_TABLET | Freq: Four times a day (QID) | ORAL | 0 refills | Status: DC | PRN

## 2017-04-13 NOTE — Telephone Encounter (Signed)
Patient requests refill on Hydrocodone/Acetaminophen 7.5-325  Mgs.   Qty  30  Sig: Take 1 tablet by mouth every 6 (six) hours as needed for moderate pain.  Patient states he uses [facility] in [place]

## 2017-04-13 NOTE — Telephone Encounter (Signed)
[place]

## 2017-04-13 NOTE — Progress Notes (Signed)
PWP: 280 overdose risk score

## 2017-04-23 ENCOUNTER — Telehealth: Payer: Self-pay | Admitting: Orthopedic Surgery

## 2017-04-23 ENCOUNTER — Other Ambulatory Visit: Payer: Self-pay | Admitting: Orthopedic Surgery

## 2017-04-23 DIAGNOSIS — Z4889 Encounter for other specified surgical aftercare: Secondary | ICD-10-CM

## 2017-04-23 MED ORDER — HYDROCODONE-ACETAMINOPHEN 5-325 MG PO TABS: 1.0000 | ORAL_TABLET | Freq: Three times a day (TID) | ORAL | 0 refills | Status: DC | PRN

## 2017-04-23 NOTE — Telephone Encounter (Signed)
Hydrocodone-Acetaminophen  5/325 mg  Qty 30 Tablets  Take 1 tablet every 6 (six) hours as needed for moderate pain.   Patient uses [facility] here in [place]

## 2017-04-23 NOTE — Progress Notes (Signed)
Data bank cked

## 2017-05-01 ENCOUNTER — Encounter: Payer: Self-pay | Admitting: Orthopedic Surgery

## 2017-05-01 ENCOUNTER — Ambulatory Visit (INDEPENDENT_AMBULATORY_CARE_PROVIDER_SITE_OTHER): Payer: Self-pay | Admitting: Orthopedic Surgery

## 2017-05-01 VITALS — BP 176/104 | HR 84 | Ht 75.0 in | Wt 204.0 lb

## 2017-05-01 DIAGNOSIS — Z4889 Encounter for other specified surgical aftercare: Secondary | ICD-10-CM

## 2017-05-01 DIAGNOSIS — S76112D Strain of left quadriceps muscle, fascia and tendon, subsequent encounter: Secondary | ICD-10-CM

## 2017-05-01 MED ORDER — HYDROCODONE-ACETAMINOPHEN 5-325 MG PO TABS: 1.0000 | ORAL_TABLET | Freq: Three times a day (TID) | ORAL | 0 refills | Status: DC | PRN

## 2017-05-01 NOTE — Patient Instructions (Signed)
Continue brace wear

## 2017-05-01 NOTE — Progress Notes (Signed)
POST OP  Encounter Diagnoses  Name Primary?  . Rupture of left quadriceps tendon, subsequent encounter s/p repair 03/05/17 Yes  . Aftercare following surgery     ROM = 0-90 MILD TERMINAL KNEE EXT LAG  BRACE 0-120   CONTINUE BRACE AND FU IN 4 WEEKS

## 2017-05-06 DIAGNOSIS — E785 Hyperlipidemia, unspecified: Secondary | ICD-10-CM | POA: Diagnosis not present

## 2017-05-06 DIAGNOSIS — I10 Essential (primary) hypertension: Secondary | ICD-10-CM | POA: Diagnosis not present

## 2017-05-11 ENCOUNTER — Telehealth: Payer: Self-pay | Admitting: Orthopedic Surgery

## 2017-05-11 NOTE — Telephone Encounter (Signed)
Hydrocodone-Acetaminophen 5/325 mg  Qty 21 Tablets  Take 1 tablet by mouth every 8 (eight) hours as needed for moderate pain.  Patient states he uses [facility] in [place]

## 2017-05-12 ENCOUNTER — Other Ambulatory Visit: Payer: Self-pay | Admitting: Orthopedic Surgery

## 2017-05-12 MED ORDER — HYDROCODONE-ACETAMINOPHEN 5-325 MG PO TABS: 1.0000 | ORAL_TABLET | Freq: Two times a day (BID) | ORAL | 0 refills | Status: DC | PRN

## 2017-05-12 NOTE — Progress Notes (Signed)
norco

## 2017-06-01 ENCOUNTER — Ambulatory Visit (INDEPENDENT_AMBULATORY_CARE_PROVIDER_SITE_OTHER): Payer: Self-pay | Admitting: Orthopedic Surgery

## 2017-06-01 ENCOUNTER — Encounter: Payer: Self-pay | Admitting: Orthopedic Surgery

## 2017-06-01 ENCOUNTER — Telehealth (HOSPITAL_COMMUNITY): Payer: Self-pay | Admitting: Physical Therapy

## 2017-06-01 VITALS — BP 189/92 | HR 68 | Ht 75.0 in | Wt 212.0 lb

## 2017-06-01 DIAGNOSIS — S76112D Strain of left quadriceps muscle, fascia and tendon, subsequent encounter: Secondary | ICD-10-CM

## 2017-06-01 MED ORDER — HYDROCODONE-ACETAMINOPHEN 5-325 MG PO TABS: 1.0000 | ORAL_TABLET | Freq: Three times a day (TID) | ORAL | 0 refills | Status: DC | PRN

## 2017-06-01 NOTE — Patient Instructions (Signed)
Start PT [facility]

## 2017-06-01 NOTE — Progress Notes (Signed)
Chief Complaint  Patient presents with  . Routine Post Op    s/p repair quad tendon 03/05/17    88 days s/p quad tendon repair   He is making progress but his knee flexion is only 95 degrees he still has a weak terminal knee straight leg raise with minimal extensor lag  No swelling mild tenderness at the quadriceps area  Recommend physical therapy for 6 weeks follow-up in 7 weeks I did refill his medication he is on a tapering process for the opioid

## 2017-06-01 NOTE — Telephone Encounter (Signed)
Pt  called to confirm apptment date and time. NF 06/01/17

## 2017-06-10 ENCOUNTER — Encounter (HOSPITAL_COMMUNITY): Payer: Self-pay | Admitting: Physical Therapy

## 2017-06-10 ENCOUNTER — Other Ambulatory Visit: Payer: Self-pay

## 2017-06-10 ENCOUNTER — Telehealth: Payer: Self-pay | Admitting: Orthopedic Surgery

## 2017-06-10 ENCOUNTER — Ambulatory Visit (HOSPITAL_COMMUNITY): Payer: Medicare Other | Attending: Orthopedic Surgery | Admitting: Physical Therapy

## 2017-06-10 DIAGNOSIS — X58XXXD Exposure to other specified factors, subsequent encounter: Secondary | ICD-10-CM | POA: Insufficient documentation

## 2017-06-10 DIAGNOSIS — R531 Weakness: Secondary | ICD-10-CM | POA: Insufficient documentation

## 2017-06-10 DIAGNOSIS — R2689 Other abnormalities of gait and mobility: Secondary | ICD-10-CM | POA: Insufficient documentation

## 2017-06-10 DIAGNOSIS — S76112D Strain of left quadriceps muscle, fascia and tendon, subsequent encounter: Secondary | ICD-10-CM | POA: Diagnosis not present

## 2017-06-10 DIAGNOSIS — M25662 Stiffness of left knee, not elsewhere classified: Secondary | ICD-10-CM | POA: Insufficient documentation

## 2017-06-10 DIAGNOSIS — R29898 Other symptoms and signs involving the musculoskeletal system: Secondary | ICD-10-CM | POA: Insufficient documentation

## 2017-06-10 NOTE — Telephone Encounter (Signed)
Too soon for refill Dr Aline Brochure indicated on 06/01/17 3 week supply   Has been 9 days I called patient to encourage  him to use Naproxen/ Tylenol instead.   Left message for him to call me back.

## 2017-06-10 NOTE — Telephone Encounter (Signed)
Call from [place] - ph# [phone], therapist requests orders/protocol.

## 2017-06-10 NOTE — Telephone Encounter (Signed)
Patient requests refill:  HYDROcodone-acetaminophen (NORCO) 5-325 MG tablet 1 tablet  -[place]

## 2017-06-10 NOTE — Therapy (Addendum)
[place], [place], [address] Phone: [phone]   Fax:  [phone]  Physical Therapy Evaluation  Patient Details  Name: Stephen Koch MRN: [id number] Date of Birth: [birth year] Referring Provider: [name] MD   Encounter Date: 06/10/2017  PT End of Session - 06/10/17 2302    Visit Number  1    Number of Visits  19    Date for PT Re-Evaluation  07/03/17    Authorization Type  Medicare Part A     Authorization Time Period  06/10/17 - 07/24/17    Authorization - Visit Number  1    Authorization - Number of Visits  10    PT Start Time  0910 Patient arrived late    PT Stop Time  0948    PT Time Calculation (min)  38 min    Equipment Utilized During Treatment  Left knee immobilizer    Activity Tolerance  Patient tolerated treatment well;No increased pain    Behavior During Therapy  WFL for tasks assessed/performed       Past Medical History:  Diagnosis Date  . Arthritis   . Chronic back pain    Forklift injury  . Essential hypertension, benign   . GERD (gastroesophageal reflux disease)   . History of medication noncompliance     Past Surgical History:  Procedure Laterality Date  . COLONOSCOPY N/A 07/06/2012   ZHG:DJMEQA mucosa in the terminal ileum/Moderate sized internal hemorrhoids  . ESOPHAGOGASTRODUODENOSCOPY N/A 04/08/2013   STM:HDQQI gastric ulcer/duodenal inflammation. 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 Left knee surgery Left    arthroscopy  . QUADRICEPS TENDON REPAIR Left 03/05/2017   Procedure: REPAIR QUADRICEP TENDON;  Surgeon: Carole Civil, MD;  Location: [facility];  Service: Orthopedics;  Laterality: Left;  . Right arm surgery     tendon repair    There were no vitals filed for this visit.   Subjective Assessment - 06/10/17 0916    Subjective  Patient reported that he had been walking his dog and his dog jerked away  and patient twisted his left knee and he felt a loud pop, he popped it back into place, but he stated he was unable to get off of the ground after this. Patient reported he had a quadriceps tendon rupture on the left and he had surgery to repair it on 03/05/17. Patient reported that the physician told him that the T knee brace needs to be worn everywhere except for at home. Patient reported that he has still been wearing the brace at home because he feels like his left leg is weak and could give out. Patient reported that 20 years ago he tore the cartilage in his left knee and had to have surgery. He also reported that he does have tingling and numbness on the left side of his body, but it is due to an accident that he had several years ago and that the level of sensation he has, has not changed since the quadriceps tendon rupture repair surgery. Patient reported that he is unsure of which medications he is on currently, but will bring a list next time.     Pertinent History  S/p quadriceps tendon repair 03/05/17; Nerve damage on the left side from accident 4 years ago; cartilage repair to left knee 20 years ago     Limitations  Sitting;Standing;Walking    How long can you sit comfortably?  10 minutes    How long can you stand comfortably?  5 minutes    How long can you walk comfortably?  5 minutes     Diagnostic tests MRI of left knee 02/27/17 confirmed quadriceps tendon rupture. X-ray 03/13/17 indicated no acute osseous abnormality about the knee, and postoperative changes from quadriceps tendon repair.     Patient Stated Goals  Strengthen his leg     Currently in Pain?  Yes    Pain Score  6     Pain Location  Knee    Pain Orientation  Left    Pain Descriptors / Indicators  Sharp    Pain Type  Surgical pain    Pain Onset  More than a month ago    Aggravating Factors   Sitting too long, standing too long    Pain Relieving Factors  Elevating it    Effect of Pain on Daily Activities  High impact     Multiple Pain Sites  No         [facility] PT Assessment - 06/10/17 0001      Assessment   Medical Diagnosis  Rupture of left quadriceps tendon, subsequent encounter. S/p repair left quadriceps tendon    Referring Provider  [name] MD    Onset Date/Surgical Date  03/05/17    Next MD Visit  -- After 6 weeks    Prior Therapy  No      Precautions   Precautions  -- Patient reported that he is to wear T-brace except at home    Precaution Comments  Reaching out to [place] about possible other precautions    Required Braces or Orthoses  Other Brace/Splint;Knee Immobilizer - Left T-brace      Restrictions   Weight Bearing Restrictions  Yes    LLE Weight Bearing  Weight bearing as tolerated    Other Position/Activity Restrictions  Patient reported that the knee brace is no longer locked      Balance Screen   Has the patient fallen in the past 6 months  Yes    How many times?  2    Has the patient had a decrease in activity level because of a fear of falling?   Yes    Is the patient reluctant to leave their home because of a fear of falling?   No      Home Environment   Living Environment  Private residence    Living Arrangements  Other relatives 32 year old grandmother    Home Access  Stairs to enter    Entrance Stairs-Number of Steps  6    [place]  One level      Prior Function   Level of Independence  Independent      Cognition   Overall Cognitive Status  Within Functional Limits for tasks assessed      Observation/Other Assessments   Observations  Patient's incision appears to have healed, noted edema around left knee joint    Skin Integrity  Incision appears to have healed and scar looks okay without any visible signs of infection    Focus on Therapeutic Outcomes (FOTO)   42% (58% limited)      Circumferential Edema   Circumferential - Right  40 cm mid patella 38 cm at 4 cm below; 39 cm at 4 cm above    Circumferential - Left    41 cm mid patella 37.5 cm at 4 cm below; 39  cm at 4 cm above      Sensation   Light Touch  Not tested    Additional Comments  Patient reported a history of numbness on the left side from a previous accident not related to current surgery      AROM   Right/Left Knee  Right;Left    Right Knee Extension  0    Right Knee Flexion  120    Left Knee Extension  -2 lacks 2 degrees of extension    Left Knee Flexion  95      Strength   Strength Assessment Site  Hip;Knee;Ankle    Right/Left Hip  Right;Left    Right Hip Flexion  4+/5    Right Hip Extension  3+/5    Right Hip ABduction  5/5    Left Hip Flexion  3+/5    Left Hip Extension  2+/5    Left Hip ABduction  4-/5    Right/Left Knee  Right;Left    Right Knee Flexion  5/5    Right Knee Extension  5/5    Left Knee Flexion  3/5    Left Knee Extension  3-/5    Right/Left Ankle  Right;Left    Right Ankle Dorsiflexion  5/5    Right Ankle Plantar Flexion  4+/5    Left Ankle Dorsiflexion  3+/5    Left Ankle Plantar Flexion  4+/5      Palpation   Patella mobility  Assess next session    Palpation comment  Noted edema around left knee joint, patient tender to palpation at quadriceps and around knee joint      Ambulation/Gait   Ambulation Distance (Feet)  70 Feet    Gait Comments  Patient ambulated with decreased stance time on the left lower extremity and antalgic gait. Assess further next session.       Standardized Balance Assessment   Five times sit to stand comments   Patient performed 5 times sit to stand in 33 seconds             Objective measurements completed on examination: See above findings.              PT Education - 06/10/17 2301    Education provided  Yes    Education Details  Patient was educated about exmination findings, and plan of care.     Person(s) Educated  Patient    Methods  Explanation    Comprehension  Verbalized understanding       PT Short Term Goals - 06/10/17 2311      PT  SHORT TERM GOAL #1   Title  Patient will demonstrate understanding and report regular compliance of HEP.     Time  3    Period  Weeks    Status  New    Target Date  07/01/17      PT SHORT TERM GOAL #2   Title  Patient will demonstrate improved left knee flexion AROM of 10 degrees in order to assist with performance of stair ambulation and gait.     Baseline  Patient demonstrated 95 degrees of left knee AROM at evaluation.    Time  3    Period  Weeks    Status  New    Target Date  07/01/17      PT SHORT TERM GOAL #3   Title  Patient will demonstrate improved MMT strength of 1/2 grade in all deficient planes in lower extremities as evidence of improved  lower extremity strength in order to assist patient with performing functional activities.     Baseline  Patient is deficient in bilateral lower extremity strength.     Time  3    Period  Weeks    Status  New    Target Date  07/01/17        PT Long Term Goals - 06/10/17 2319      PT LONG TERM GOAL #1   Title  Patient will demonstrate left knee AROM that is equal to that of patient's right knee AROM in order for improved mechanics with functional activities.     Baseline  Patient's left knee flexion AROM is 95 degrees and knee extension ROM is lacking 2 degrees.     Time  6    Period  Weeks    Status  New    Target Date  07/22/17      PT LONG TERM GOAL #2   Title  Patient will demonstrate improved MMT strength of 1 grade in all deficient planes in lower extremities as evidence of improved lower extremity strength in order to assist patient with performing functional activities.     Baseline  Patient is deficient in bilateral lower extremity strength.     Time  6    Period  Weeks    Status  New    Target Date  07/22/17      PT LONG TERM GOAL #3   Title  Patient will perform 5 times sit to stand in less than or equal to 12 seconds as evidence of improved functional mobility and decreased risk of falls.     Baseline  Patient  performed 5 times sit to stand in 33 seconds.     Time  6    Period  Weeks    Status  New    Target Date  07/22/17             Plan - 06/10/17 2305    Clinical Impression Statement  Patient is a pleasant 57 year old male who presented to physical therapy for evaluation s/p Left quadriceps tendon rupture repair on 03/05/17. Upon examination patient demonstrated decreased range of motion of the left knee particularly with knee flexion. Patient demonstrated decreased strength in bilateral lower extremities, but primarily on the left side. Edema was noted with a 1 cm difference in circumference around mid patellar point on left knee compared to the right knee. Patient demonstrated decreased balance and an increased risk of falls as he performed five times sit to stand in 33 seconds. Patient reported pain and tenderness to palpation around left knee joint and at quadriceps on left lower extremity. Patient's FOTO score was 42% or 58% limited. Patient used a T-brace and ambulated with antalgic gait with decreased stance time on the left lower extremity. Patient would benefit from skilled physical therapy to safely progress patient with goals of improving patient's range of motion, strength, balance, gait, decreasing patient's pain, and improving patient's overall functional mobility.     History and Personal Factors relevant to plan of care:  S/p left quadriceps tendon rupture repair; accident resulting in decreased sensation of left side of body; left knee cartilage repeair 20 years ago    Clinical Presentation  Stable    Clinical Presentation due to:  FOTO score; 5x STS; ROM; MMT; clinical judgement    Clinical Decision Making  Low    Rehab Potential  Fair    Clinical Impairments Affecting Rehab Potential  Positive: Patient's positive attitude and motivation; Negative: length of time since surgery, history of smoking affecting healing time    PT Frequency  3x / week    PT Duration  6 weeks    PT  Treatment/Interventions  ADLs/Self Care Home Management;Aquatic Therapy;Cryotherapy;Electrical Stimulation;Moist Heat;DME Instruction;Gait training;Stair training;Functional mobility training;Therapeutic activities;Therapeutic exercise;Balance training;Neuromuscular re-education;Patient/family education;Manual techniques;Scar mobilization;Passive range of motion    PT Next Visit Plan  3MWT; Follow up about medications; patellar mobility assessment; stairs assessment; initiate HEP; begin manual therapy for edema control if still present, begin exercises with a focus on improving patient's ROM with a focus on knee flexion of left knee     PT Home Exercise Plan  Initiate next session     Consulted and Agree with Plan of Care  Patient       Patient will benefit from skilled therapeutic intervention in order to improve the following deficits and impairments:  Abnormal gait, Decreased activity tolerance, Decreased endurance, Decreased range of motion, Decreased strength, Hypomobility, Increased fascial restricitons, Improper body mechanics, Pain, Decreased balance, Decreased mobility, Difficulty walking, Increased edema, Impaired flexibility  Visit Diagnosis: Rupture, tendon, quadriceps, left, subsequent encounter  Decreased range of motion (ROM) of left knee  Decreased strength  Other symptoms and signs involving the musculoskeletal system  Other abnormalities of gait and mobility  Impairment of balance     Problem List Patient Active Problem List   Diagnosis Date Noted  . Rupture of left quadriceps tendon s/p repair 03/05/17   . Helicobacter pylori gastritis 04/18/2015  . ETOH abuse 04/18/2015  . Liver lesion 04/18/2015  . Essential hypertension, benign 11/04/2013  . Heart murmur 11/04/2013  . Back pain associated with peripheral numbness 08/10/2013  . Upper abdominal pain 03/31/2013  . Hematochezia 07/01/2012   Clarene Critchley PT, DPT 11:42 PM, 06/10/17 [place], [place], [address] Phone: [phone]   Fax:  [phone]  Name: Stephen Koch MRN: [id number] Date of Birth: 20-Feb-1961

## 2017-06-10 NOTE — Telephone Encounter (Signed)
Orders in system Do you have a protocol for Quad repair?

## 2017-06-11 NOTE — Addendum Note (Signed)
Addended by: Clarene Critchley on: 06/11/2017 08:38 AM   Modules accepted: Orders

## 2017-06-12 ENCOUNTER — Ambulatory Visit (HOSPITAL_COMMUNITY): Payer: Medicare Other | Admitting: Physical Therapy

## 2017-06-12 ENCOUNTER — Other Ambulatory Visit: Payer: Self-pay | Admitting: Orthopedic Surgery

## 2017-06-12 ENCOUNTER — Encounter (HOSPITAL_COMMUNITY): Payer: Self-pay | Admitting: Physical Therapy

## 2017-06-12 ENCOUNTER — Encounter: Payer: Self-pay | Admitting: Orthopedic Surgery

## 2017-06-12 DIAGNOSIS — R2689 Other abnormalities of gait and mobility: Secondary | ICD-10-CM

## 2017-06-12 DIAGNOSIS — R531 Weakness: Secondary | ICD-10-CM | POA: Diagnosis not present

## 2017-06-12 DIAGNOSIS — S76112D Strain of left quadriceps muscle, fascia and tendon, subsequent encounter: Secondary | ICD-10-CM

## 2017-06-12 DIAGNOSIS — M25662 Stiffness of left knee, not elsewhere classified: Secondary | ICD-10-CM | POA: Diagnosis not present

## 2017-06-12 DIAGNOSIS — R29898 Other symptoms and signs involving the musculoskeletal system: Secondary | ICD-10-CM

## 2017-06-12 MED ORDER — HYDROCODONE-ACETAMINOPHEN 5-325 MG PO TABS: 1.0000 | ORAL_TABLET | Freq: Three times a day (TID) | ORAL | 0 refills | Status: DC | PRN

## 2017-06-12 NOTE — Patient Instructions (Signed)
  ANKLE PUMPS - AP Bend your foot up and down at your ankle joint as shown. Repeat 10 Times Hold 1 Second Complete 3 Sets Perform 2 Time(s) a Day   QUAD SET Tighten your top thigh muscle as you attempt to press the back of your knee downward towards the table. Left leg Repeat 15 Times Hold 5 Seconds Complete 1 Set Perform 1 Time(s) a Day   SHORT ARC QUAD - SAQ Place a rolled up towel or object under your knee and slowly straighten your knee as your raise up your foot. Repeat 10 Times Hold 2 Seconds Complete 2 Sets Perform 1 Time(s) a Day   STRAIGHT LEG RAISE - SLR While lying on your back, raise up your leg with a straight knee. Keep the opposite knee bent with the foot planted on the ground. Repeat 10 Times Hold 1 Second Complete 2 Sets Perform 1 Time(s) a Day   HEEL SLIDES - SUPINE Lying on your back with knees straight, slide the affected heel towards your buttock as you bend your knee. Hold a gentle stretch in this position and then return to original position. Repeat 10 Times Hold 1 Second Complete 2 Sets Perform 2 Time(s) a Day   HIP ABDUCTION - SIDELYING While lying on your side, slowly raise up your top leg to the side. Keep your knee straight and maintain your toes pointed forward the entire time. Keep your leg in-line with your body. The bottom leg can be bent to stabilize your Body.  Repeat 10 Times Hold 1 Second Complete 2 Sets Perform 1 Time(s) a Day

## 2017-06-12 NOTE — Progress Notes (Signed)
Administrator, sports  For Mr Sagar only  AAROM, AROM, PROM  QUAD STRENGTHENING

## 2017-06-12 NOTE — Therapy (Signed)
[place], [place], [address] Phone: [phone]   Fax:  [phone]  Physical Therapy Treatment  Patient Details  Name: Stephen Koch MRN: [id number] Date of Birth: 01-Jul-1960 Referring Provider: [name] MD   Encounter Date: 06/12/2017  PT End of Session - 06/12/17 1706    Visit Number  2    Number of Visits  19    Date for PT Re-Evaluation  07/03/17    Authorization Type  Medicare Part A     Authorization Time Period  06/10/17 - 07/24/17    Authorization - Visit Number  2    Authorization - Number of Visits  10    PT Start Time  1025    PT Stop Time  1730    PT Time Calculation (min)  42 min    Equipment Utilized During Treatment  Left knee immobilizer    Activity Tolerance  Patient tolerated treatment well;No increased pain    Behavior During Therapy  WFL for tasks assessed/performed       Past Medical History:  Diagnosis Date  . Arthritis   . Chronic back pain    Forklift injury  . Essential hypertension, benign   . GERD (gastroesophageal reflux disease)   . History of medication noncompliance     Past Surgical History:  Procedure Laterality Date  . COLONOSCOPY N/A 07/06/2012   ENI:DPOEUM mucosa in the terminal ileum/Moderate sized internal hemorrhoids  . ESOPHAGOGASTRODUODENOSCOPY N/A 04/08/2013   PNT:IRWER gastric ulcer/duodenal inflammation. 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 Left knee surgery Left    arthroscopy  . QUADRICEPS TENDON REPAIR Left 03/05/2017   Procedure: REPAIR QUADRICEP TENDON;  Surgeon: Carole Civil, MD;  Location: [facility];  Service: Orthopedics;  Laterality: Left;  . Right arm surgery     tendon repair    There were no vitals filed for this visit.  Subjective Assessment - 06/12/17 1704    Subjective  Patient denied any pain currently. Patient stated he did not bring his T-brace this session and  has a soft brace instead.     Currently in Pain?  No/denies                      [facility] Adult PT Treatment/Exercise - 06/12/17 0001      Knee/Hip Exercises: Supine   Quad Sets  1 set;15 reps;Strengthening;Left;AROM    Quad Sets Limitations  5 second holds    Short Arc [employer]  10 reps;2 sets;Limitations;Strengthening;Left    Short Arc Quad Sets Limitations  Over foam roll     Heel Slides  AROM;2 sets;10 reps;Left    Heel Slides Limitations  3 second holds    Straight Leg Raises  Strengthening;Left;2 sets;10 reps    Other Supine Knee/Hip Exercises  Ankle pumps 3 x 10 for edema reduction in knee       Knee/Hip Exercises: Sidelying   Hip ABduction  Strengthening;Right;Left;1 set;10 reps      Manual Therapy   Manual Therapy  Edema management    Manual therapy comments  Completed separately from all other skilled interventions    Edema Management  Retrograde massage to the left lower extremity for edema reduction x 10 minutes              PT Education - 06/12/17 1745    Education provided  Yes    Education Details  Patient  was educated about evaluation, home exercise program, purpose of interventions and technique throughout.     Person(s) Educated  Patient    Methods  Explanation;Demonstration;Tactile cues;Verbal cues;Handout    Comprehension  Verbalized understanding;Returned demonstration;Verbal cues required       PT Short Term Goals - 06/10/17 2311      PT SHORT TERM GOAL #1   Title  Patient will demonstrate understanding and report regular compliance of HEP.     Time  3    Period  Weeks    Status  New    Target Date  07/01/17      PT SHORT TERM GOAL #2   Title  Patient will demonstrate improved left knee flexion AROM of 10 degrees in order to assist with performance of stair ambulation and gait.     Baseline  Patient demonstrated 95 degrees of left knee AROM at evaluation.    Time  3    Period  Weeks    Status  New    Target Date  07/01/17       PT SHORT TERM GOAL #3   Title  Patient will demonstrate improved MMT strength of 1/2 grade in all deficient planes in lower extremities as evidence of improved lower extremity strength in order to assist patient with performing functional activities.     Baseline  Patient is deficient in bilateral lower extremity strength.     Time  3    Period  Weeks    Status  New    Target Date  07/01/17        PT Long Term Goals - 06/10/17 2319      PT LONG TERM GOAL #1   Title  Patient will demonstrate left knee AROM that is equal to that of patient's right knee AROM in order for improved mechanics with functional activities.     Baseline  Patient's left knee flexion AROM is 95 degrees and knee extension ROM is lacking 2 degrees.     Time  6    Period  Weeks    Status  New    Target Date  07/22/17      PT LONG TERM GOAL #2   Title  Patient will demonstrate improved MMT strength of 1 grade in all deficient planes in lower extremities as evidence of improved lower extremity strength in order to assist patient with performing functional activities.     Baseline  Patient is deficient in bilateral lower extremity strength.     Time  6    Period  Weeks    Status  New    Target Date  07/22/17      PT LONG TERM GOAL #3   Title  Patient will perform 5 times sit to stand in less than or equal to 12 seconds as evidence of improved functional mobility and decreased risk of falls.     Baseline  Patient performed 5 times sit to stand in 33 seconds.     Time  6    Period  Weeks    Status  New    Target Date  07/22/17            Plan - 06/12/17 1737    Clinical Impression Statement  This session began with reviewing patient's evaluation and goals for therapy. Then the therapist instructed the patient on home exercise program exercises. Patient then performed the exercises from the home exercise program which focused on edema reduction, active range of motion of the  left knee, strengthening of the  left quadriceps, and strengthening of bilateral hip abductors. Patient demonstrated understanding of exercises and tolerated well. Finally, therapist provided manual therapy for edema reduction to patient's left lower extremity. Patient forgot to bring the list of his medications this session, but stated he would bring them next session. Patient will also bring T-brace next session. Therapist deferred ambulation this session because patient did not have the brace. Patient would benefit from continued skilled physical therapy to address deficits in range of motion, strength, and overall functional mobility.     Rehab Potential  Fair    Clinical Impairments Affecting Rehab Potential  Positive: Patient's positive attitude and motivation; Negative: length of time since surgery, history of smoking affecting healing time    PT Frequency  3x / week    PT Duration  6 weeks    PT Treatment/Interventions  ADLs/Self Care Home Management;Aquatic Therapy;Cryotherapy;Electrical Stimulation;Moist Heat;DME Instruction;Gait training;Stair training;Functional mobility training;Therapeutic activities;Therapeutic exercise;Balance training;Neuromuscular re-education;Patient/family education;Manual techniques;Scar mobilization;Passive range of motion    PT Next Visit Plan  3MWT; Follow up about medications; patellar mobility assessment; stairs assessment; follow-up about HEP progress; manual for edema control if still present, ROM and strengthening of quadriceps     PT Home Exercise Plan  Ankle pumps supine 3 x 10 2x/day; Heel slides supine 2 x 10 3'' holds 2x/day; SAQ 2 x 10 2'' holds 1x/day; SLR 2 x 10 1x/day; Sidelying hip abduction 2 x 10 1x/day each leg; Quad sets 15 x 5 second holds 1x/day    Consulted and Agree with Plan of Care  Patient       Patient will benefit from skilled therapeutic intervention in order to improve the following deficits and impairments:  Abnormal gait, Decreased activity tolerance, Decreased  endurance, Decreased range of motion, Decreased strength, Hypomobility, Increased fascial restricitons, Improper body mechanics, Pain, Decreased balance, Decreased mobility, Difficulty walking, Increased edema, Impaired flexibility  Visit Diagnosis: Rupture, tendon, quadriceps, left, subsequent encounter  Decreased range of motion (ROM) of left knee  Decreased strength  Other symptoms and signs involving the musculoskeletal system  Other abnormalities of gait and mobility  Impairment of balance     Problem List Patient Active Problem List   Diagnosis Date Noted  . Rupture of left quadriceps tendon s/p repair 03/05/17   . Helicobacter pylori gastritis 04/18/2015  . ETOH abuse 04/18/2015  . Liver lesion 04/18/2015  . Essential hypertension, benign 11/04/2013  . Heart murmur 11/04/2013  . Back pain associated with peripheral numbness 08/10/2013  . Upper abdominal pain 03/31/2013  . Hematochezia 07/01/2012   Clarene Critchley PT, DPT 5:52 PM, 06/12/17 [place], [place], [address] Phone: [phone]   Fax:  [phone]  Name: Stephen Koch MRN: [id number] Date of Birth: 1960-05-02

## 2017-06-15 NOTE — Telephone Encounter (Signed)
Patient aware. Appointment scheduled accordingly.

## 2017-06-17 ENCOUNTER — Telehealth (HOSPITAL_COMMUNITY): Payer: Self-pay | Admitting: Family Medicine

## 2017-06-17 ENCOUNTER — Ambulatory Visit (HOSPITAL_COMMUNITY): Payer: Medicare Other

## 2017-06-17 NOTE — Telephone Encounter (Signed)
06/17/17  pt left a message that he was out of town today

## 2017-06-19 ENCOUNTER — Encounter (HOSPITAL_COMMUNITY): Payer: Self-pay

## 2017-06-19 ENCOUNTER — Ambulatory Visit (HOSPITAL_COMMUNITY): Payer: Medicare Other

## 2017-06-19 DIAGNOSIS — M25662 Stiffness of left knee, not elsewhere classified: Secondary | ICD-10-CM | POA: Diagnosis not present

## 2017-06-19 DIAGNOSIS — R29898 Other symptoms and signs involving the musculoskeletal system: Secondary | ICD-10-CM

## 2017-06-19 DIAGNOSIS — S76112D Strain of left quadriceps muscle, fascia and tendon, subsequent encounter: Secondary | ICD-10-CM

## 2017-06-19 DIAGNOSIS — R2689 Other abnormalities of gait and mobility: Secondary | ICD-10-CM | POA: Diagnosis not present

## 2017-06-19 DIAGNOSIS — R531 Weakness: Secondary | ICD-10-CM

## 2017-06-19 NOTE — Therapy (Signed)
[place], [place], [address] Phone: [phone]   Fax:  [phone]  Physical Therapy Treatment  Patient Details  Name: Stephen Koch MRN: [id number] Date of Birth: 06/10/1960 Referring Provider: [name], MD   Encounter Date: 06/19/2017  PT End of Session - 06/19/17 1000    Visit Number  3    Number of Visits  19    Date for PT Re-Evaluation  07/03/17    Authorization Type  Medicare Part A     Authorization Time Period  06/10/17 - 07/24/17    Authorization - Visit Number  3    Authorization - Number of Visits  10    PT Start Time  0949    PT Stop Time  1032    PT Time Calculation (min)  43 min    Equipment Utilized During Treatment  Left knee immobilizer during wb activities    Activity Tolerance  Patient tolerated treatment well;No increased pain    Behavior During Therapy  WFL for tasks assessed/performed       Past Medical History:  Diagnosis Date  . Arthritis   . Chronic back pain    Forklift injury  . Essential hypertension, benign   . GERD (gastroesophageal reflux disease)   . History of medication noncompliance     Past Surgical History:  Procedure Laterality Date  . COLONOSCOPY N/A 07/06/2012   WNU:UVOZDG mucosa in the terminal ileum/Moderate sized internal hemorrhoids  . ESOPHAGOGASTRODUODENOSCOPY N/A 04/08/2013   UYQ:IHKVQ gastric ulcer/duodenal inflammation. 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 Left knee surgery Left    arthroscopy  . QUADRICEPS TENDON REPAIR Left 03/05/2017   Procedure: REPAIR QUADRICEP TENDON;  Surgeon: Carole Civil, MD;  Location: [facility];  Service: Orthopedics;  Laterality: Left;  . Right arm surgery     tendon repair    There were no vitals filed for this visit.  Subjective Assessment - 06/19/17 0956    Subjective  Pt arrived with brace on knee, brought list of medication.  Reports  complaince wiht HEP daily.  Reports increased pain with flexion. increases to 7/10.    Pertinent History  S/p quadriceps tendon repair 03/05/17; Nerve damage on the left side from accident 4 years ago; cartilage repair to left knee 20 years ago     Patient Stated Goals  Strengthen his leg     Currently in Pain?  Yes    Pain Score  7     Pain Location  Knee    Pain Orientation  Left    Pain Descriptors / Indicators  Sharp    Pain Type  Surgical pain    Pain Onset  More than a month ago    Pain Frequency  Intermittent    Aggravating Factors   sitting too long, standing too long    Pain Relieving Factors  elevating it    Effect of Pain on Daily Activities  high impact         [facility] PT Assessment - 06/19/17 0001      Assessment   Medical Diagnosis  Rupture of left quadriceps tendon, subsequent encounter. S/p repair left quadriceps tendon    Referring Provider  [name], MD    Onset Date/Surgical Date  03/05/17    Next MD Visit  -- After 6 weeks    Prior Therapy  No      Precautions   Precautions  --  Pt reports he is to wear T-brace except at home    Precaution Comments  Reaching out to [place] about possible other precautions    Required Braces or Orthoses  Other Brace/Splint;Knee Immobilizer - Left T-brace      Restrictions   Weight Bearing Restrictions  Yes    LLE Weight Bearing  Weight bearing as tolerated    Other Position/Activity Restrictions  Patient reported that the knee brace is no longer locked      Ambulation/Gait   Ambulation Distance (Feet)  517 Feet 3MWT    Assistive device  None    Gait Comments  Patient ambulated with decreased stance time on the left lower extremity and antalgic gait.       6 Minute Walk- Baseline   6 Minute Walk- Baseline  yes                  [facility] Adult PT Treatment/Exercise - 06/19/17 0001      Knee/Hip Exercises: Supine   Quad Sets  1 set;15 reps;Strengthening;Left;AROM    Quad Sets Limitations  5" holds     Short Arc [employer]  10 reps;2 sets;Limitations;Strengthening;Left    Short Arc Quad Sets Limitations  1/2 bolster, 5" holds    Heel Slides  AROM;10 reps;Left    Heel Slides Limitations  3 second holds    Straight Leg Raises  Strengthening;Left;15 reps    Knee Extension  AROM;Limitations    Knee Extension Limitations  0    Knee Flexion  AROM;Limitations    Knee Flexion Limitations  120      Manual Therapy   Manual Therapy  Edema management;Joint mobilization    Manual therapy comments  Completed separately from all other skilled interventions    Edema Management  Retrograde massage to the left lower extremity for edema reduction x 10 minutes     Joint Mobilization  patella mobs all directions               PT Short Term Goals - 06/10/17 2311      PT SHORT TERM GOAL #1   Title  Patient will demonstrate understanding and report regular compliance of HEP.     Time  3    Period  Weeks    Status  New    Target Date  07/01/17      PT SHORT TERM GOAL #2   Title  Patient will demonstrate improved left knee flexion AROM of 10 degrees in order to assist with performance of stair ambulation and gait.     Baseline  Patient demonstrated 95 degrees of left knee AROM at evaluation.    Time  3    Period  Weeks    Status  New    Target Date  07/01/17      PT SHORT TERM GOAL #3   Title  Patient will demonstrate improved MMT strength of 1/2 grade in all deficient planes in lower extremities as evidence of improved lower extremity strength in order to assist patient with performing functional activities.     Baseline  Patient is deficient in bilateral lower extremity strength.     Time  3    Period  Weeks    Status  New    Target Date  07/01/17        PT Long Term Goals - 06/10/17 2319      PT LONG TERM GOAL #1   Title  Patient will demonstrate left knee AROM that is equal  to that of patient's right knee AROM in order for improved mechanics with functional activities.      Baseline  Patient's left knee flexion AROM is 95 degrees and knee extension ROM is lacking 2 degrees.     Time  6    Period  Weeks    Status  New    Target Date  07/22/17      PT LONG TERM GOAL #2   Title  Patient will demonstrate improved MMT strength of 1 grade in all deficient planes in lower extremities as evidence of improved lower extremity strength in order to assist patient with performing functional activities.     Baseline  Patient is deficient in bilateral lower extremity strength.     Time  6    Period  Weeks    Status  New    Target Date  07/22/17      PT LONG TERM GOAL #3   Title  Patient will perform 5 times sit to stand in less than or equal to 12 seconds as evidence of improved functional mobility and decreased risk of falls.     Baseline  Patient performed 5 times sit to stand in 33 seconds.     Time  6    Period  Weeks    Status  New    Target Date  07/22/17            Plan - 06/19/17 1245    Clinical Impression Statement  Session focus on quad strengthening primarly and knee mobility.  T-brace on during all weight bearing but removed during mat activiteis.  Pt AROM 0-120.  Pt able to demonstrate good quadricep contraction with no extension lag noted during SLR.  3MWT complete in 517 ft with no AD, pt did demonstrate gait deficitis that will be addressed next session.  No reports of increased pain.  Reviewed ice application to assist iwth pain and edema control proximal knee     Rehab Potential  Fair    Clinical Impairments Affecting Rehab Potential  Positive: Patient's positive attitude and motivation; Negative: length of time since surgery, history of smoking affecting healing time    PT Frequency  3x / week    PT Duration  6 weeks    PT Treatment/Interventions  ADLs/Self Care Home Management;Aquatic Therapy;Cryotherapy;Electrical Stimulation;Moist Heat;DME Instruction;Gait training;Stair training;Functional mobility training;Therapeutic activities;Therapeutic  exercise;Balance training;Neuromuscular re-education;Patient/family education;Manual techniques;Scar mobilization;Passive range of motion    PT Next Visit Plan  Next session continue with quad strengthening/4way SLR and knee mobitliy; Address gait mechanics and begin bike for mobility.  manual patellar mobility; stairs assessment; follow-up about HEP progress; manual for edema control if still present, ROM and strengthening of quadriceps     PT Home Exercise Plan  Ankle pumps supine 3 x 10 2x/day; Heel slides supine 2 x 10 3'' holds 2x/day; SAQ 2 x 10 2'' holds 1x/day; SLR 2 x 10 1x/day; Sidelying hip abduction 2 x 10 1x/day each leg; Quad sets 15 x 5 second holds 1x/day       Patient will benefit from skilled therapeutic intervention in order to improve the following deficits and impairments:  Abnormal gait, Decreased activity tolerance, Decreased endurance, Decreased range of motion, Decreased strength, Hypomobility, Increased fascial restricitons, Improper body mechanics, Pain, Decreased balance, Decreased mobility, Difficulty walking, Increased edema, Impaired flexibility  Visit Diagnosis: Rupture, tendon, quadriceps, left, subsequent encounter  Decreased range of motion (ROM) of left knee  Decreased strength  Other symptoms and signs involving the musculoskeletal  system  Other abnormalities of gait and mobility  Impairment of balance     Problem List Patient Active Problem List   Diagnosis Date Noted  . Rupture of left quadriceps tendon s/p repair 03/05/17   . Helicobacter pylori gastritis 04/18/2015  . ETOH abuse 04/18/2015  . Liver lesion 04/18/2015  . Essential hypertension, benign 11/04/2013  . Heart murmur 11/04/2013  . Back pain associated with peripheral numbness 08/10/2013  . Upper abdominal pain 03/31/2013  . Hematochezia 07/01/2012   Ihor Austin, [place]; [place]  Aldona Lento 06/19/2017, 12:51 PM  [place] [address] [place], [place], [address] Phone: [phone]   Fax:  [phone]  Name: Stephen Koch MRN: [id number] Date of Birth: Sep 15, 1960

## 2017-06-22 ENCOUNTER — Ambulatory Visit (HOSPITAL_COMMUNITY): Payer: Medicare Other | Admitting: Physical Therapy

## 2017-06-22 DIAGNOSIS — R29898 Other symptoms and signs involving the musculoskeletal system: Secondary | ICD-10-CM | POA: Diagnosis not present

## 2017-06-22 DIAGNOSIS — M25662 Stiffness of left knee, not elsewhere classified: Secondary | ICD-10-CM | POA: Diagnosis not present

## 2017-06-22 DIAGNOSIS — S76112D Strain of left quadriceps muscle, fascia and tendon, subsequent encounter: Secondary | ICD-10-CM | POA: Diagnosis not present

## 2017-06-22 DIAGNOSIS — R2689 Other abnormalities of gait and mobility: Secondary | ICD-10-CM | POA: Diagnosis not present

## 2017-06-22 DIAGNOSIS — R531 Weakness: Secondary | ICD-10-CM | POA: Diagnosis not present

## 2017-06-22 NOTE — Therapy (Signed)
[place], [place], [address] Phone: [phone]   Fax:  [phone]  Physical Therapy Treatment  Patient Details  Name: Stephen Koch MRN: [id number] Date of Birth: 11-13-1960 Referring Provider: [name], MD   Encounter Date: 06/22/2017  PT End of Session - 06/22/17 1442    Visit Number  4    Number of Visits  19    Date for PT Re-Evaluation  07/03/17    Authorization Type  Medicare Part A     Authorization Time Period  06/10/17 - 07/24/17    Authorization - Visit Number  4    Authorization - Number of Visits  10    PT Start Time  1856    PT Stop Time  1735    PT Time Calculation (min)  45 min    Equipment Utilized During Treatment  Left knee immobilizer during wb activities    Activity Tolerance  Patient tolerated treatment well;No increased pain    Behavior During Therapy  WFL for tasks assessed/performed       Past Medical History:  Diagnosis Date  . Arthritis   . Chronic back pain    Forklift injury  . Essential hypertension, benign   . GERD (gastroesophageal reflux disease)   . History of medication noncompliance     Past Surgical History:  Procedure Laterality Date  . COLONOSCOPY N/A 07/06/2012   [facility]:HFWYOV mucosa in the terminal ileum/Moderate sized internal hemorrhoids  . ESOPHAGOGASTRODUODENOSCOPY N/A 04/08/2013   [facility]:YIFOY gastric ulcer/duodenal inflammation. 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 Left knee surgery Left    arthroscopy  . QUADRICEPS TENDON REPAIR Left 03/05/2017   Procedure: REPAIR QUADRICEP TENDON;  Surgeon: Carole Civil, MD;  Location: [facility];  Service: Orthopedics;  Laterality: Left;  . Right arm surgery     tendon repair    There were no vitals filed for this visit.  Subjective Assessment - 06/22/17 1410    Subjective  PT compliant with wearing brace.  STates he's been doing his exercises.  Pain  remains at 7/10    Currently in Pain?  Yes    Pain Score  7     Pain Location  Knee    Pain Orientation  Left    Pain Descriptors / Indicators  Sharp    Pain Type  Surgical pain                      [facility] Adult PT Treatment/Exercise - 06/22/17 0001      Knee/Hip Exercises: Aerobic   Recumbent Bike  4 minutes full revolutions seat 17      Knee/Hip Exercises: Standing   Lateral Step Up  Left;Hand Hold: 1;10 reps;Step Height: 4"    Forward Step Up  Left;10 reps;Hand Hold: 1;Step Height: 4"      Knee/Hip Exercises: Supine   Short Arc Quad Sets  Left;20 reps    Short Arc Quad Sets Limitations  full bolster 5"    Heel Slides  AROM;Left;15 reps    Heel Slides Limitations  3 second holds    Straight Leg Raises  15 reps      Knee/Hip Exercises: Sidelying   Hip ABduction  15 reps    Hip ADduction  15 reps      Knee/Hip Exercises: Prone   Hip Extension  15 reps      Manual Therapy   Manual  Therapy  Edema management;Joint mobilization    Manual therapy comments  Completed separately from all other skilled interventions    Edema Management  Retrograde massage to the left lower extremity for edema reduction x 10 minutes     Joint Mobilization  patella mobs all directions               PT Short Term Goals - 06/10/17 2311      PT SHORT TERM GOAL #1   Title  Patient will demonstrate understanding and report regular compliance of HEP.     Time  3    Period  Weeks    Status  New    Target Date  07/01/17      PT SHORT TERM GOAL #2   Title  Patient will demonstrate improved left knee flexion AROM of 10 degrees in order to assist with performance of stair ambulation and gait.     Baseline  Patient demonstrated 95 degrees of left knee AROM at evaluation.    Time  3    Period  Weeks    Status  New    Target Date  07/01/17      PT SHORT TERM GOAL #3   Title  Patient will demonstrate improved MMT strength of 1/2 grade in all deficient planes in lower extremities  as evidence of improved lower extremity strength in order to assist patient with performing functional activities.     Baseline  Patient is deficient in bilateral lower extremity strength.     Time  3    Period  Weeks    Status  New    Target Date  07/01/17        PT Long Term Goals - 06/10/17 2319      PT LONG TERM GOAL #1   Title  Patient will demonstrate left knee AROM that is equal to that of patient's right knee AROM in order for improved mechanics with functional activities.     Baseline  Patient's left knee flexion AROM is 95 degrees and knee extension ROM is lacking 2 degrees.     Time  6    Period  Weeks    Status  New    Target Date  07/22/17      PT LONG TERM GOAL #2   Title  Patient will demonstrate improved MMT strength of 1 grade in all deficient planes in lower extremities as evidence of improved lower extremity strength in order to assist patient with performing functional activities.     Baseline  Patient is deficient in bilateral lower extremity strength.     Time  6    Period  Weeks    Status  New    Target Date  07/22/17      PT LONG TERM GOAL #3   Title  Patient will perform 5 times sit to stand in less than or equal to 12 seconds as evidence of improved functional mobility and decreased risk of falls.     Baseline  Patient performed 5 times sit to stand in 33 seconds.     Time  6    Period  Weeks    Status  New    Target Date  07/22/17            Plan - 06/22/17 1447    Clinical Impression Statement  Continued focus on quad strengthening and reduction of edema, primarily superior knee.  Added forward and lateral step ups with good concentric contraction and eccentric  control noted.  Pt required VC's to complete SLR without extension lag.  Continued with edema massage at end of session.      Rehab Potential  Fair    Clinical Impairments Affecting Rehab Potential  Positive: Patient's positive attitude and motivation; Negative: length of time since  surgery, history of smoking affecting healing time    PT Frequency  3x / week    PT Duration  6 weeks    PT Treatment/Interventions  ADLs/Self Care Home Management;Aquatic Therapy;Cryotherapy;Electrical Stimulation;Moist Heat;DME Instruction;Gait training;Stair training;Functional mobility training;Therapeutic activities;Therapeutic exercise;Balance training;Neuromuscular re-education;Patient/family education;Manual techniques;Scar mobilization;Passive range of motion    PT Next Visit Plan  Next session continue with quad strengthening and increasing functional strength.  Begin heel/toe raises, hip strengthening, SLS and wall squats.    PT Home Exercise Plan  Ankle pumps supine 3 x 10 2x/day; Heel slides supine 2 x 10 3'' holds 2x/day; SAQ 2 x 10 2'' holds 1x/day; SLR 2 x 10 1x/day; Sidelying hip abduction 2 x 10 1x/day each leg; Quad sets 15 x 5 second holds 1x/day       Patient will benefit from skilled therapeutic intervention in order to improve the following deficits and impairments:  Abnormal gait, Decreased activity tolerance, Decreased endurance, Decreased range of motion, Decreased strength, Hypomobility, Increased fascial restricitons, Improper body mechanics, Pain, Decreased balance, Decreased mobility, Difficulty walking, Increased edema, Impaired flexibility  Visit Diagnosis: Rupture, tendon, quadriceps, left, subsequent encounter  Decreased range of motion (ROM) of left knee     Problem List Patient Active Problem List   Diagnosis Date Noted  . Rupture of left quadriceps tendon s/p repair 03/05/17   . Helicobacter pylori gastritis 04/18/2015  . ETOH abuse 04/18/2015  . Liver lesion 04/18/2015  . Essential hypertension, benign 11/04/2013  . Heart murmur 11/04/2013  . Back pain associated with peripheral numbness 08/10/2013  . Upper abdominal pain 03/31/2013  . Hematochezia 07/01/2012   Teena Irani, PTA/CLT [phone]  Teena Irani 06/22/2017, 2:53 PM  [facility] [address] [place], [place], [address] Phone: [phone]   Fax:  [phone]  Name: DERYL GIROUX MRN: [id number] Date of Birth: [birth year]

## 2017-06-24 ENCOUNTER — Ambulatory Visit (HOSPITAL_COMMUNITY): Payer: Medicare Other | Admitting: Physical Therapy

## 2017-06-24 ENCOUNTER — Telehealth (HOSPITAL_COMMUNITY): Payer: Self-pay | Admitting: Physical Therapy

## 2017-06-24 NOTE — Telephone Encounter (Signed)
Therapist called patient about not showing up for appointment at 9:45 this morning. Therapist asked if everything was alright and informed patient that he should call at least 24 hours in advance if he does not think he can make an appointment. Therapist reminded patient of when his next appointment is scheduled and told patient to call the clinic with number provided if he has any questions or comments. Call completed at 10:10 am.   Clarene Critchley PT, DPT 10:10 AM, 06/24/17 [phone]

## 2017-06-26 ENCOUNTER — Ambulatory Visit (HOSPITAL_COMMUNITY): Payer: Medicare Other | Admitting: Physical Therapy

## 2017-06-26 ENCOUNTER — Telehealth (HOSPITAL_COMMUNITY): Payer: Self-pay | Admitting: Family Medicine

## 2017-06-26 NOTE — Telephone Encounter (Signed)
06/26/17  pt left a message to say he couldn't make it to his appt today

## 2017-06-29 ENCOUNTER — Ambulatory Visit (HOSPITAL_COMMUNITY): Payer: Medicare Other | Attending: Orthopedic Surgery | Admitting: Physical Therapy

## 2017-06-29 DIAGNOSIS — M25662 Stiffness of left knee, not elsewhere classified: Secondary | ICD-10-CM | POA: Diagnosis not present

## 2017-06-29 DIAGNOSIS — R531 Weakness: Secondary | ICD-10-CM

## 2017-06-29 DIAGNOSIS — R29898 Other symptoms and signs involving the musculoskeletal system: Secondary | ICD-10-CM | POA: Diagnosis not present

## 2017-06-29 DIAGNOSIS — R2689 Other abnormalities of gait and mobility: Secondary | ICD-10-CM | POA: Insufficient documentation

## 2017-06-29 DIAGNOSIS — S76112D Strain of left quadriceps muscle, fascia and tendon, subsequent encounter: Secondary | ICD-10-CM

## 2017-06-29 DIAGNOSIS — X58XXXD Exposure to other specified factors, subsequent encounter: Secondary | ICD-10-CM | POA: Diagnosis not present

## 2017-06-29 NOTE — Therapy (Signed)
[facility] [place], [place], [address] Phone: [phone]   Fax:  [phone]  Physical Therapy Treatment  Patient Details  Name: Stephen Koch MRN: [id number] Date of Birth: 05/06/1960 Referring Provider: [name], MD   Encounter Date: 06/29/2017  PT End of Session - 06/29/17 1143    Visit Number  5    Number of Visits  19    Date for PT Re-Evaluation  07/03/17    Authorization Type  Medicare Part A     Authorization Time Period  06/10/17 - 07/24/17    Authorization - Visit Number  5    Authorization - Number of Visits  10    PT Start Time  1115    PT Stop Time  1200    PT Time Calculation (min)  45 min    Equipment Utilized During Treatment  Left knee immobilizer during wb activities    Activity Tolerance  Patient tolerated treatment well;No increased pain    Behavior During Therapy  WFL for tasks assessed/performed       Past Medical History:  Diagnosis Date  . Arthritis   . Chronic back pain    Forklift injury  . Essential hypertension, benign   . GERD (gastroesophageal reflux disease)   . History of medication noncompliance     Past Surgical History:  Procedure Laterality Date  . COLONOSCOPY N/A 07/06/2012   [facility]:HENIDP mucosa in the terminal ileum/Moderate sized internal hemorrhoids  . ESOPHAGOGASTRODUODENOSCOPY N/A 04/08/2013   [facility]:MPNTI gastric ulcer/duodenal inflammation. 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 Left knee surgery Left    arthroscopy  . QUADRICEPS TENDON REPAIR Left 03/05/2017   Procedure: REPAIR QUADRICEP TENDON;  Surgeon: Carole Civil, MD;  Location: [facility];  Service: Orthopedics;  Laterality: Left;  . Right arm surgery     tendon repair    There were no vitals filed for this visit.  Subjective Assessment - 06/29/17 1118    Subjective  Pt states he continues to wear his brace.  STates his whole body has been  hurting from the cold.  Currently knee pain is 6/10.    Currently in Pain?  Yes    Pain Score  6     Pain Location  Knee    Pain Orientation  Left    Pain Descriptors / Indicators  Sharp    Pain Type  Surgical pain                      [facility] Adult PT Treatment/Exercise - 06/29/17 0001      Knee/Hip Exercises: Aerobic   Recumbent Bike  4 minutes full revolutions seat 17      Knee/Hip Exercises: Standing   Heel Raises  Both;10 reps;Limitations    Heel Raises Limitations  toeraises 10 reps    Hip Abduction  Both;10 reps    Hip Extension  Both;10 reps    Lateral Step Up  Left;10 reps;Step Height: 4";Hand Hold: 0    Forward Step Up  Left;10 reps;Step Height: 4";Hand Hold: 0    Wall Squat  10 reps    SLS  max 30"Lt, 20"Rt      Knee/Hip Exercises: Supine   Straight Leg Raises  15 reps      Knee/Hip Exercises: Sidelying   Hip ABduction  15 reps    Hip ADduction  15 reps      Knee/Hip Exercises:  Prone   Hip Extension  15 reps      Manual Therapy   Manual Therapy  Edema management;Joint mobilization    Manual therapy comments  Completed separately from all other skilled interventions    Edema Management  Retrograde massage to the left lower extremity for edema reduction x 10 minutes     Joint Mobilization  patella mobs all directions               PT Short Term Goals - 06/10/17 2311      PT SHORT TERM GOAL #1   Title  Patient will demonstrate understanding and report regular compliance of HEP.     Time  3    Period  Weeks    Status  New    Target Date  07/01/17      PT SHORT TERM GOAL #2   Title  Patient will demonstrate improved left knee flexion AROM of 10 degrees in order to assist with performance of stair ambulation and gait.     Baseline  Patient demonstrated 95 degrees of left knee AROM at evaluation.    Time  3    Period  Weeks    Status  New    Target Date  07/01/17      PT SHORT TERM GOAL #3   Title  Patient will demonstrate improved  MMT strength of 1/2 grade in all deficient planes in lower extremities as evidence of improved lower extremity strength in order to assist patient with performing functional activities.     Baseline  Patient is deficient in bilateral lower extremity strength.     Time  3    Period  Weeks    Status  New    Target Date  07/01/17        PT Long Term Goals - 06/10/17 2319      PT LONG TERM GOAL #1   Title  Patient will demonstrate left knee AROM that is equal to that of patient's right knee AROM in order for improved mechanics with functional activities.     Baseline  Patient's left knee flexion AROM is 95 degrees and knee extension ROM is lacking 2 degrees.     Time  6    Period  Weeks    Status  New    Target Date  07/22/17      PT LONG TERM GOAL #2   Title  Patient will demonstrate improved MMT strength of 1 grade in all deficient planes in lower extremities as evidence of improved lower extremity strength in order to assist patient with performing functional activities.     Baseline  Patient is deficient in bilateral lower extremity strength.     Time  6    Period  Weeks    Status  New    Target Date  07/22/17      PT LONG TERM GOAL #3   Title  Patient will perform 5 times sit to stand in less than or equal to 12 seconds as evidence of improved functional mobility and decreased risk of falls.     Baseline  Patient performed 5 times sit to stand in 33 seconds.     Time  6    Period  Weeks    Status  New    Target Date  07/22/17            Plan - 06/29/17 1144    Clinical Impression Statement  Contiued with strengthening and return to normal  functioning.  Able to complete forward and lateral step ups today wtihout UE assist and improved control.  Pt with good abiltiy completing SLS with 30" achieved on Lt.  Progressed adding hip stregthening in standing, wallslides and heel/toe raises.  Pt able to complete all exercises wtihout pain and wtih need for cues for correct  posturing/form.  Continued with edema massage at EOS.     Rehab Potential  Fair    Clinical Impairments Affecting Rehab Potential  Positive: Patient's positive attitude and motivation; Negative: length of time since surgery, history of smoking affecting healing time    PT Frequency  3x / week    PT Duration  6 weeks    PT Treatment/Interventions  ADLs/Self Care Home Management;Aquatic Therapy;Cryotherapy;Electrical Stimulation;Moist Heat;DME Instruction;Gait training;Stair training;Functional mobility training;Therapeutic activities;Therapeutic exercise;Balance training;Neuromuscular re-education;Patient/family education;Manual techniques;Scar mobilization;Passive range of motion    PT Next Visit Plan  Next session continue with quad strengthening and increasing functional strength.  Begin lunges on 4" riser, SLS on airex and SLS vector stance.      PT Home Exercise Plan  Ankle pumps supine 3 x 10 2x/day; Heel slides supine 2 x 10 3'' holds 2x/day; SAQ 2 x 10 2'' holds 1x/day; SLR 2 x 10 1x/day; Sidelying hip abduction 2 x 10 1x/day each leg; Quad sets 15 x 5 second holds 1x/day       Patient will benefit from skilled therapeutic intervention in order to improve the following deficits and impairments:  Abnormal gait, Decreased activity tolerance, Decreased endurance, Decreased range of motion, Decreased strength, Hypomobility, Increased fascial restricitons, Improper body mechanics, Pain, Decreased balance, Decreased mobility, Difficulty walking, Increased edema, Impaired flexibility  Visit Diagnosis: Rupture, tendon, quadriceps, left, subsequent encounter  Decreased range of motion (ROM) of left knee  Decreased strength     Problem List Patient Active Problem List   Diagnosis Date Noted  . Rupture of left quadriceps tendon s/p repair 03/05/17   . Helicobacter pylori gastritis 04/18/2015  . ETOH abuse 04/18/2015  . Liver lesion 04/18/2015  . Essential hypertension, benign 11/04/2013  .  Heart murmur 11/04/2013  . Back pain associated with peripheral numbness 08/10/2013  . Upper abdominal pain 03/31/2013  . Hematochezia 07/01/2012   Teena Irani, PTA/CLT [phone]  Teena Irani 06/29/2017, 11:59 AM  [place], [place], [address] Phone: [phone]   Fax:  [phone]  Name: OTHMAN MASUR MRN: [id number] Date of Birth: 20-Feb-1961

## 2017-07-01 ENCOUNTER — Encounter (HOSPITAL_COMMUNITY): Payer: Self-pay | Admitting: Physical Therapy

## 2017-07-01 ENCOUNTER — Ambulatory Visit (HOSPITAL_COMMUNITY): Payer: Medicare Other | Admitting: Physical Therapy

## 2017-07-01 DIAGNOSIS — R2689 Other abnormalities of gait and mobility: Secondary | ICD-10-CM | POA: Diagnosis not present

## 2017-07-01 DIAGNOSIS — S76112D Strain of left quadriceps muscle, fascia and tendon, subsequent encounter: Secondary | ICD-10-CM

## 2017-07-01 DIAGNOSIS — M25662 Stiffness of left knee, not elsewhere classified: Secondary | ICD-10-CM | POA: Diagnosis not present

## 2017-07-01 DIAGNOSIS — R29898 Other symptoms and signs involving the musculoskeletal system: Secondary | ICD-10-CM

## 2017-07-01 DIAGNOSIS — R531 Weakness: Secondary | ICD-10-CM

## 2017-07-01 NOTE — Therapy (Signed)
[place], [place], [address] Phone: [phone]   Fax:  [phone]  Physical Therapy Treatment  Patient Details  Name: Stephen Koch MRN: [id number] Date of Birth: 04/06/1961 Referring Provider: [name], MD   Encounter Date: 07/01/2017  PT End of Session - 07/01/17 1042    Visit Number  6    Number of Visits  19    Date for PT Re-Evaluation  07/03/17    Authorization Type  Medicare Part A     Authorization Time Period  06/10/17 - 07/24/17    Authorization - Visit Number  6    Authorization - Number of Visits  10    PT Start Time  0946    PT Stop Time  1031    PT Time Calculation (min)  45 min    Equipment Utilized During Treatment  Left knee immobilizer during wb activities    Activity Tolerance  Patient tolerated treatment well;No increased pain    Behavior During Therapy  WFL for tasks assessed/performed       Past Medical History:  Diagnosis Date  . Arthritis   . Chronic back pain    Forklift injury  . Essential hypertension, benign   . GERD (gastroesophageal reflux disease)   . History of medication noncompliance     Past Surgical History:  Procedure Laterality Date  . COLONOSCOPY N/A 07/06/2012   ONG:EXBMWU mucosa in the terminal ileum/Moderate sized internal hemorrhoids  . ESOPHAGOGASTRODUODENOSCOPY N/A 04/08/2013   XLK:GMWNU gastric ulcer/duodenal inflammation. 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 Left knee surgery Left    arthroscopy  . QUADRICEPS TENDON REPAIR Left 03/05/2017   Procedure: REPAIR QUADRICEP TENDON;  Surgeon: Carole Civil, MD;  Location: [facility];  Service: Orthopedics;  Laterality: Left;  . Right arm surgery     tendon repair    There were no vitals filed for this visit.  Subjective Assessment - 07/01/17 0951    Subjective  Pt states he continues to wear his brace. Patient reported his knee pain is  6/10.    Patient Stated Goals  Strengthen his leg     Currently in Pain?  Yes    Pain Score  6     Pain Location  Knee    Pain Orientation  Left    Pain Descriptors / Indicators  Sharp    Pain Type  Surgical pain    Pain Onset  More than a month ago    Multiple Pain Sites  No                      [facility] Adult PT Treatment/Exercise - 07/01/17 0001      Knee/Hip Exercises: Aerobic   Recumbent Bike  4 minutes full revolutions seat 16      Knee/Hip Exercises: Standing   Heel Raises  Both;10 reps;Limitations    Heel Raises Limitations  toeraises 10 reps    Hip Abduction  Both;10 reps    Hip Extension  Both;10 reps    Lateral Step Up  Left;10 reps;Hand Hold: 0 6 with 6 inch step, became difficult, changed to 4 inch step    Forward Step Up  Left;10 reps;Hand Hold: 0;Step Height: 6"    Functional Squat  10 reps    SLS  SLS on left on airex x 10 repetitions without upper extremity support      Knee/Hip Exercises:  Seated   Long Arc Quad  Strengthening;Left;15 reps;Limitations    Long Arc Quad Limitations  Concentric portion only, therapist assisted lowering      Knee/Hip Exercises: Supine   Straight Leg Raises  15 reps      Knee/Hip Exercises: Sidelying   Hip ABduction  15 reps;Strengthening;Left    Hip ADduction  15 reps;Strengthening;Left      Knee/Hip Exercises: Prone   Hip Extension  15 reps;Strengthening;Left      Manual Therapy   Manual Therapy  Edema management;Joint mobilization    Manual therapy comments  10 minutes total Completed separately from all other skilled interventions    Edema Management  Retrograde massage to the left lower extremity supine with leg elevated over bolster for edema reduction    Joint Mobilization  Patella mobilization to patient's tolerance, superior inferior lateral and medial               PT Short Term Goals - 06/10/17 2311      PT SHORT TERM GOAL #1   Title  Patient will demonstrate understanding and report  regular compliance of HEP.     Time  3    Period  Weeks    Status  New    Target Date  07/01/17      PT SHORT TERM GOAL #2   Title  Patient will demonstrate improved left knee flexion AROM of 10 degrees in order to assist with performance of stair ambulation and gait.     Baseline  Patient demonstrated 95 degrees of left knee AROM at evaluation.    Time  3    Period  Weeks    Status  New    Target Date  07/01/17      PT SHORT TERM GOAL #3   Title  Patient will demonstrate improved MMT strength of 1/2 grade in all deficient planes in lower extremities as evidence of improved lower extremity strength in order to assist patient with performing functional activities.     Baseline  Patient is deficient in bilateral lower extremity strength.     Time  3    Period  Weeks    Status  New    Target Date  07/01/17        PT Long Term Goals - 06/10/17 2319      PT LONG TERM GOAL #1   Title  Patient will demonstrate left knee AROM that is equal to that of patient's right knee AROM in order for improved mechanics with functional activities.     Baseline  Patient's left knee flexion AROM is 95 degrees and knee extension ROM is lacking 2 degrees.     Time  6    Period  Weeks    Status  New    Target Date  07/22/17      PT LONG TERM GOAL #2   Title  Patient will demonstrate improved MMT strength of 1 grade in all deficient planes in lower extremities as evidence of improved lower extremity strength in order to assist patient with performing functional activities.     Baseline  Patient is deficient in bilateral lower extremity strength.     Time  6    Period  Weeks    Status  New    Target Date  07/22/17      PT LONG TERM GOAL #3   Title  Patient will perform 5 times sit to stand in less than or equal to 12 seconds as evidence  of improved functional mobility and decreased risk of falls.     Baseline  Patient performed 5 times sit to stand in 33 seconds.     Time  6    Period  Weeks     Status  New    Target Date  07/22/17            Plan - 07/01/17 1043    Clinical Impression Statement  This session began with a focus on improving patient's range of motion in the left knee. Patient began on recumbent bicycle for 4 minutes which was not billed in overall time. Then performed manual therapy in order to decrease edema and improve patellar mobility. Then patient progressed to performing strengthening exercises with a focus on quadriceps strengthening. Patient progressed to forward step ups on a 6 inch step, but demonstrated difficulty with lateral step ups on the 6 inch step and therefore returned to the 4 inch step. This session patient performed long arc quads with just the concentric portion, allowing the therapist to lower the patient's leg. This session also added single leg stance on the airex pad in order to challenge patient's balance on the left lower extremity further. Patient denied any increase in pain with exercises.      Rehab Potential  Fair    Clinical Impairments Affecting Rehab Potential  Positive: Patient's positive attitude and motivation; Negative: length of time since surgery, history of smoking affecting healing time    PT Frequency  3x / week    PT Duration  6 weeks    PT Treatment/Interventions  ADLs/Self Care Home Management;Aquatic Therapy;Cryotherapy;Electrical Stimulation;Moist Heat;DME Instruction;Gait training;Stair training;Functional mobility training;Therapeutic activities;Therapeutic exercise;Balance training;Neuromuscular re-education;Patient/family education;Manual techniques;Scar mobilization;Passive range of motion    PT Next Visit Plan  Next session continue with quad strengthening and increasing functional strength. Be careful with eccentric quad strengthening exercises. Begin lunges on 4" riser and SLS vector stance.      PT Home Exercise Plan  Ankle pumps supine 3 x 10 2x/day; Heel slides supine 2 x 10 3'' holds 2x/day; SAQ 2 x 10 2'' holds  1x/day; SLR 2 x 10 1x/day; Sidelying hip abduction 2 x 10 1x/day each leg; Quad sets 15 x 5 second holds 1x/day       Patient will benefit from skilled therapeutic intervention in order to improve the following deficits and impairments:  Abnormal gait, Decreased activity tolerance, Decreased endurance, Decreased range of motion, Decreased strength, Hypomobility, Increased fascial restricitons, Improper body mechanics, Pain, Decreased balance, Decreased mobility, Difficulty walking, Increased edema, Impaired flexibility  Visit Diagnosis: Rupture, tendon, quadriceps, left, subsequent encounter  Decreased range of motion (ROM) of left knee  Decreased strength  Other symptoms and signs involving the musculoskeletal system  Other abnormalities of gait and mobility  Impairment of balance     Problem List Patient Active Problem List   Diagnosis Date Noted  . Rupture of left quadriceps tendon s/p repair 03/05/17   . Helicobacter pylori gastritis 04/18/2015  . ETOH abuse 04/18/2015  . Liver lesion 04/18/2015  . Essential hypertension, benign 11/04/2013  . Heart murmur 11/04/2013  . Back pain associated with peripheral numbness 08/10/2013  . Upper abdominal pain 03/31/2013  . Hematochezia 07/01/2012   Clarene Critchley PT, DPT 10:52 AM, 07/01/17 [place], [place], [address] Phone: [phone]   Fax:  [phone]  Name: Stephen Koch MRN: [id number] Date of Birth: December 03, 1960

## 2017-07-03 ENCOUNTER — Ambulatory Visit (HOSPITAL_COMMUNITY): Payer: Medicare Other | Admitting: Physical Therapy

## 2017-07-03 ENCOUNTER — Encounter (HOSPITAL_COMMUNITY): Payer: Self-pay | Admitting: Physical Therapy

## 2017-07-03 DIAGNOSIS — R29898 Other symptoms and signs involving the musculoskeletal system: Secondary | ICD-10-CM

## 2017-07-03 DIAGNOSIS — R531 Weakness: Secondary | ICD-10-CM

## 2017-07-03 DIAGNOSIS — S76112D Strain of left quadriceps muscle, fascia and tendon, subsequent encounter: Secondary | ICD-10-CM | POA: Diagnosis not present

## 2017-07-03 DIAGNOSIS — M25662 Stiffness of left knee, not elsewhere classified: Secondary | ICD-10-CM

## 2017-07-03 DIAGNOSIS — R2689 Other abnormalities of gait and mobility: Secondary | ICD-10-CM

## 2017-07-03 NOTE — Therapy (Signed)
[place], [place], [address] Phone: [phone]   Fax:  [phone]  Physical Therapy Treatment  Patient Details  Name: Stephen Koch MRN: [id number] Date of Birth: [birth year] Referring Provider: [name], MD   Encounter Date: 07/03/2017   AROM Left knee flexion: 115 AROM Left knee extension: 0 Ambulation: 630 feet on 3MWT  PT End of Session - 07/03/17 1019    Visit Number  7    Number of Visits  19    Date for PT Re-Evaluation  07/24/17    Authorization Type  Medicare Part A     Authorization Time Period  06/10/17 - 07/24/17    Authorization - Visit Number  7    Authorization - Number of Visits  10    PT Start Time  [phone] Patient arrived late    PT Stop Time  1030    PT Time Calculation (min)  34 min    Equipment Utilized During Treatment  -- during wb activities    Activity Tolerance  Patient tolerated treatment well;No increased pain    Behavior During Therapy  WFL for tasks assessed/performed       Past Medical History:  Diagnosis Date  . Arthritis   . Chronic back pain    Forklift injury  . Essential hypertension, benign   . GERD (gastroesophageal reflux disease)   . History of medication noncompliance     Past Surgical History:  Procedure Laterality Date  . COLONOSCOPY N/A 07/06/2012   RXV:QMGQQP mucosa in the terminal ileum/Moderate sized internal hemorrhoids  . ESOPHAGOGASTRODUODENOSCOPY N/A 04/08/2013   YPP:JKDTO gastric ulcer/duodenal inflammation. 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 Left knee surgery Left    arthroscopy  . QUADRICEPS TENDON REPAIR Left 03/05/2017   Procedure: REPAIR QUADRICEP TENDON;  Surgeon: Carole Civil, MD;  Location: [facility];  Service: Orthopedics;  Laterality: Left;  . Right arm surgery     tendon repair    There were no vitals filed for this visit.  Subjective Assessment - 07/03/17 1110     Subjective  Patient stated he isn't really having any pain today. He sated he feels like physical therapy has helped him.     Patient Stated Goals  Strengthen his leg     Currently in Pain?  No/denies    Multiple Pain Sites  No         [facility] PT Assessment - 07/03/17 0001      Prior Function   Level of Independence  Independent      Observation/Other Assessments   Focus on Therapeutic Outcomes (FOTO)   50% (50% limitation)      Circumferential Edema   Circumferential - Right  40 cm mid patella 38 at 4 cm below; 39 at 4 cm above    Circumferential - Left   40.5 cm 38.5 at 4 cm below; 39 at 4 cm above      AROM   Right Knee Extension  0    Right Knee Flexion  120    Left Knee Extension  0    Left Knee Flexion  115      Strength   Right Hip Flexion  5/5 was 4+    Right Hip Extension  4+/5 was 3+    Right Hip ABduction  5/5 was 5    Left Hip Flexion  4+/5 was 3+    Left Hip Extension  4/5 was 2+    Left Hip ABduction  4+/5 was 4-    Right Knee Flexion  5/5 was 5/5    Right Knee Extension  5/5    Left Knee Flexion  4+/5 was 3/5    Left Knee Extension  4+/5 was 3-    Right Ankle Dorsiflexion  5/5    Right Ankle Plantar Flexion  4+/5    Left Ankle Dorsiflexion  4+/5 was 3+    Left Ankle Plantar Flexion  4+/5      Palpation   Patella mobility  Patella was able to move at least 1/2 width of patella medial and laterally. More limited superiorly and inferiorly.       Ambulation/Gait   Ambulation Distance (Feet)  630 Feet 3MWT    Assistive device  None    Gait Pattern  Decreased step length - right;Decreased stance time - left;Antalgic    Ambulation Surface  Level    Gait velocity  1.07 m/s      Static Standing Balance   Static Standing - Balance Support  No upper extremity supported    Static Standing - Level of Assistance  7: Independent    Static Standing Balance -  Activities   Single Leg Stance - Right Leg;Single Leg Stance - Left Leg    Static Standing - Comment/#  of Minutes  Rt. lower extremity SLS: 24 seconds; Lt. SLS: 42 seconds      Standardized Balance Assessment   Five times sit to stand comments   18.28 seconds                  [facility] Adult PT Treatment/Exercise - 07/03/17 0001      Knee/Hip Exercises: Standing   Forward Lunges  Left;10 reps;Other (comment) onto 4 inch step    Lateral Step Up  Left;10 reps;Hand Hold: 0;Step Height: 4"    Forward Step Up  Left;10 reps;Hand Hold: 0;Step Height: 6"    Functional Squat  10 reps      Knee/Hip Exercises: Sidelying   Hip ABduction  15 reps;Strengthening;Left      Knee/Hip Exercises: Prone   Hip Extension  15 reps;Strengthening;Left             PT Education - 07/03/17 1110    Education provided  Yes    Education Details  Patient was educated on re-assessment findings, and on purpose and technique of exercises throughout.     Person(s) Educated  Patient    Methods  Explanation    Comprehension  Verbalized understanding       PT Short Term Goals - 07/03/17 1112      PT SHORT TERM GOAL #1   Title  Patient will demonstrate understanding and report regular compliance of HEP.     Time  3    Period  Weeks    Status  On-going      PT SHORT TERM GOAL #2   Title  Patient will demonstrate improved left knee flexion AROM of 10 degrees in order to assist with performance of stair ambulation and gait.     Baseline  07/03/17: Patient demonstrated 115 degrees of left knee flexion AROM.     Time  3    Period  Weeks    Status  Achieved      PT SHORT TERM GOAL #3   Title  Patient will demonstrate improved MMT strength of 1/2 grade in all deficient planes in lower extremities as evidence of improved lower  extremity strength in order to assist patient with performing functional activities.     Baseline  07/03/17: Patient improved by 1/2 MMT grades in deficient planes except left knee flexion, and bilateral plantarflexion    Time  3    Period  Weeks    Status  Partially Met         PT Long Term Goals - 07/03/17 1221      PT LONG TERM GOAL #1   Title  Patient will demonstrate left knee AROM that is equal to that of patient's right knee AROM in order for improved mechanics with functional activities.     Baseline  07/03/17: Patient's left knee extension is equal to the right knee, however patient's left knee flexion ROM is 115.    Time  6    Period  Weeks    Status  Partially Met      PT LONG TERM GOAL #2   Title  Patient will demonstrate improved MMT strength of 1 grade in all deficient planes in lower extremities as evidence of improved lower extremity strength in order to assist patient with performing functional activities.     Baseline  07/03/17: Patient achieved this in some, but not all deficient areas    Time  6    Period  Weeks    Status  Partially Met      PT LONG TERM GOAL #3   Title  Patient will perform 5 times sit to stand in less than or equal to 12 seconds as evidence of improved functional mobility and decreased risk of falls.     Baseline  07/03/17: Patient performed 5xSTS in 18.28 seconds.     Time  6    Period  Weeks    Status  On-going      PT LONG TERM GOAL #4   Title  Patient will perform the 3MWT at a velocity of at least 1.2 m/s without significant difference in stance time on the right and left lower extremity without knee brace.     Baseline  07/03/17: Patient ambulated at 1.07 m/s with significant difference in stance time on the right compared to left lower extremities.     Time  3    Period  Weeks    Status  New    Target Date  07/24/17            Plan - 07/03/17 1235    Clinical Impression Statement  This session performed a re-assessment of patient's progress toward goals. Patient has achieved 1 and partially met 1 of his short term goals. Patient has partially met 2/3 of his original long term goals. This session also added a new long term goal based on patient's performance with the 3MWT. The remaining treatment time  focused on continuing patient's progress with lower extremity strengthening. Patient would benefit from continued skilled physical therapy in order to address patient's continued deficits in strength, ROM, and overall functional mobility.     Rehab Potential  Fair    Clinical Impairments Affecting Rehab Potential  Positive: Patient's positive attitude and motivation; Negative: length of time since surgery, history of smoking affecting healing time    PT Frequency  3x / week    PT Duration  6 weeks    PT Treatment/Interventions  ADLs/Self Care Home Management;Aquatic Therapy;Cryotherapy;Electrical Stimulation;Moist Heat;DME Instruction;Gait training;Stair training;Functional mobility training;Therapeutic activities;Therapeutic exercise;Balance training;Neuromuscular re-education;Patient/family education;Manual techniques;Scar mobilization;Passive range of motion    PT Next Visit Plan  Next session continue with  quad strengthening and increasing functional strength. Be careful with eccentric quad strengthening exercises. Continue lunges on 4" riser and begin SLS vector stance.      PT Home Exercise Plan  Ankle pumps supine 3 x 10 2x/day; Heel slides supine 2 x 10 3'' holds 2x/day; SAQ 2 x 10 2'' holds 1x/day; SLR 2 x 10 1x/day; Sidelying hip abduction 2 x 10 1x/day each leg; Quad sets 15 x 5 second holds 1x/day       Patient will benefit from skilled therapeutic intervention in order to improve the following deficits and impairments:  Abnormal gait, Decreased activity tolerance, Decreased endurance, Decreased range of motion, Decreased strength, Hypomobility, Increased fascial restricitons, Improper body mechanics, Pain, Decreased balance, Decreased mobility, Difficulty walking, Increased edema, Impaired flexibility  Visit Diagnosis: Rupture, tendon, quadriceps, left, subsequent encounter - Plan: PT plan of care cert/re-cert  Decreased range of motion (ROM) of left knee - Plan: PT plan of care  cert/re-cert  Decreased strength - Plan: PT plan of care cert/re-cert  Other symptoms and signs involving the musculoskeletal system - Plan: PT plan of care cert/re-cert  Other abnormalities of gait and mobility - Plan: PT plan of care cert/re-cert  Impairment of balance - Plan: PT plan of care cert/re-cert     Problem List Patient Active Problem List   Diagnosis Date Noted  . Rupture of left quadriceps tendon s/p repair 03/05/17   . Helicobacter pylori gastritis 04/18/2015  . ETOH abuse 04/18/2015  . Liver lesion 04/18/2015  . Essential hypertension, benign 11/04/2013  . Heart murmur 11/04/2013  . Back pain associated with peripheral numbness 08/10/2013  . Upper abdominal pain 03/31/2013  . Hematochezia 07/01/2012   Clarene Critchley PT, DPT 12:41 PM, 07/03/17 [place], [place], [address] Phone: [phone]   Fax:  [phone]  Name: GAYLAN FAUVER MRN: [id number] Date of Birth: 09-03-1960

## 2017-07-06 ENCOUNTER — Telehealth: Payer: Self-pay | Admitting: Orthopedic Surgery

## 2017-07-06 ENCOUNTER — Other Ambulatory Visit: Payer: Self-pay | Admitting: Orthopedic Surgery

## 2017-07-06 ENCOUNTER — Ambulatory Visit (HOSPITAL_COMMUNITY): Payer: Medicare Other | Admitting: Physical Therapy

## 2017-07-06 DIAGNOSIS — S76112D Strain of left quadriceps muscle, fascia and tendon, subsequent encounter: Secondary | ICD-10-CM

## 2017-07-06 DIAGNOSIS — R531 Weakness: Secondary | ICD-10-CM | POA: Diagnosis not present

## 2017-07-06 DIAGNOSIS — M25662 Stiffness of left knee, not elsewhere classified: Secondary | ICD-10-CM | POA: Diagnosis not present

## 2017-07-06 DIAGNOSIS — R29898 Other symptoms and signs involving the musculoskeletal system: Secondary | ICD-10-CM | POA: Diagnosis not present

## 2017-07-06 DIAGNOSIS — R2689 Other abnormalities of gait and mobility: Secondary | ICD-10-CM | POA: Diagnosis not present

## 2017-07-06 MED ORDER — HYDROCODONE-ACETAMINOPHEN 5-325 MG PO TABS: 1.0000 | ORAL_TABLET | Freq: Two times a day (BID) | ORAL | 0 refills | Status: DC | PRN

## 2017-07-06 NOTE — Telephone Encounter (Signed)
Hydrocodone-Acetaminophen  5.325 mg  Qty 21 Tablets  Take 1 tablet by mouth every 8 (eight) hours as needed for moderate pain. 3 weeks supply.  PATIENT USES [facility]

## 2017-07-06 NOTE — Therapy (Signed)
[place], [place], [address] Phone: [phone]   Fax:  [phone]  Physical Therapy Treatment  Patient Details  Name: Stephen Koch MRN: [id number] Date of Birth: [birth year] Referring Provider: [name], MD   Encounter Date: 07/06/2017  PT End of Session - 07/06/17 0928    Visit Number  8    Number of Visits  19    Date for PT Re-Evaluation  07/24/17    Authorization Type  Medicare Part A     Authorization Time Period  06/10/17 - 07/24/17    Authorization - Visit Number  8    Authorization - Number of Visits  10    PT Start Time  0902    PT Stop Time  0940    PT Time Calculation (min)  38 min    Equipment Utilized During Treatment  -- during wb activities    Activity Tolerance  Patient tolerated treatment well;No increased pain    Behavior During Therapy  WFL for tasks assessed/performed       Past Medical History:  Diagnosis Date  . Arthritis   . Chronic back pain    Forklift injury  . Essential hypertension, benign   . GERD (gastroesophageal reflux disease)   . History of medication noncompliance     Past Surgical History:  Procedure Laterality Date  . COLONOSCOPY N/A 07/06/2012   YYT:[id number] mucosa in the terminal ileum/Moderate sized internal hemorrhoids  . ESOPHAGOGASTRODUODENOSCOPY N/A 04/08/2013   KCL:EXNTZ gastric ulcer/duodenal inflammation. 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 Left knee surgery Left    arthroscopy  . QUADRICEPS TENDON REPAIR Left 03/05/2017   Procedure: REPAIR QUADRICEP TENDON;  Surgeon: Carole Civil, MD;  Location: [facility];  Service: Orthopedics;  Laterality: Left;  . Right arm surgery     tendon repair    There were no vitals filed for this visit.  Subjective Assessment - 07/06/17 0941    Subjective  Pt states his pain is around 5/10.  STates he's still having some swelling, mostly above the  knee.     Currently in Pain?  Yes    Pain Score  5     Pain Location  Knee    Pain Orientation  Left    Pain Descriptors / Indicators  Sharp    Pain Type  Surgical pain                      [facility] Adult PT Treatment/Exercise - 07/06/17 0001      Knee/Hip Exercises: Standing   Heel Raises  Both;Limitations;15 reps    Heel Raises Limitations  toeraises 15 reps    Forward Lunges  Left;Other (comment);15 reps;Limitations    Forward Lunges Limitations  onto 4" step no UE's    Hip Abduction  Both;15 reps    Hip Extension  Both;15 reps    Lateral Step Up  Left;Hand Hold: 0;Step Height: 4";15 reps    Forward Step Up  Left;Hand Hold: 0;Step Height: 6";15 reps    Functional Squat  15 reps    SLS with Vectors  Lt 5X5" each with 1 UE asssit      Knee/Hip Exercises: Supine   Straight Leg Raises  20 reps      Knee/Hip Exercises: Sidelying   Hip ABduction  20 reps      Knee/Hip Exercises: Prone   Hip Extension  20 reps  Manual Therapy   Manual Therapy  Edema management;Joint mobilization    Manual therapy comments  10 minutes total Completed separately from all other skilled interventions    Edema Management  Retrograde massage to the left lower extremity supine with leg elevated over bolster for edema reduction    Joint Mobilization  Patella mobilization to patient's tolerance, superior inferior lateral and medial               PT Short Term Goals - 07/03/17 1112      PT SHORT TERM GOAL #1   Title  Patient will demonstrate understanding and report regular compliance of HEP.     Time  3    Period  Weeks    Status  On-going      PT SHORT TERM GOAL #2   Title  Patient will demonstrate improved left knee flexion AROM of 10 degrees in order to assist with performance of stair ambulation and gait.     Baseline  07/03/17: Patient demonstrated 115 degrees of left knee flexion AROM.     Time  3    Period  Weeks    Status  Achieved      PT SHORT TERM GOAL #3    Title  Patient will demonstrate improved MMT strength of 1/2 grade in all deficient planes in lower extremities as evidence of improved lower extremity strength in order to assist patient with performing functional activities.     Baseline  07/03/17: Patient improved by 1/2 MMT grades in deficient planes except left knee flexion, and bilateral plantarflexion    Time  3    Period  Weeks    Status  Partially Met        PT Long Term Goals - 07/03/17 1221      PT LONG TERM GOAL #1   Title  Patient will demonstrate left knee AROM that is equal to that of patient's right knee AROM in order for improved mechanics with functional activities.     Baseline  07/03/17: Patient's left knee extension is equal to the right knee, however patient's left knee flexion ROM is 115.    Time  6    Period  Weeks    Status  Partially Met      PT LONG TERM GOAL #2   Title  Patient will demonstrate improved MMT strength of 1 grade in all deficient planes in lower extremities as evidence of improved lower extremity strength in order to assist patient with performing functional activities.     Baseline  07/03/17: Patient achieved this in some, but not all deficient areas    Time  6    Period  Weeks    Status  Partially Met      PT LONG TERM GOAL #3   Title  Patient will perform 5 times sit to stand in less than or equal to 12 seconds as evidence of improved functional mobility and decreased risk of falls.     Baseline  07/03/17: Patient performed 5xSTS in 18.28 seconds.     Time  6    Period  Weeks    Status  On-going      PT LONG TERM GOAL #4   Title  Patient will perform the 3MWT at a velocity of at least 1.2 m/s without significant difference in stance time on the right and left lower extremity without knee brace.     Baseline  07/03/17: Patient ambulated at 1.07 m/s with significant difference in stance  time on the right compared to left lower extremities.     Time  3    Period  Weeks    Status  New    Target  Date  07/24/17            Plan - 07/06/17 0929    Clinical Impression Statement  Continued with focus on strengthening Lt LE.  Able to increase most reps today and added SLS vectors with cues for hold times and posture.  Pt able to complete all exercises without c/o pain or diffiuclty.  Improved Straight leg raise with minimal extension lag.  Manual completed to residual swelling mostly anterior/superior knee.      Rehab Potential  Fair    Clinical Impairments Affecting Rehab Potential  Positive: Patient's positive attitude and motivation; Negative: length of time since surgery, history of smoking affecting healing time    PT Frequency  3x / week    PT Duration  6 weeks    PT Treatment/Interventions  ADLs/Self Care Home Management;Aquatic Therapy;Cryotherapy;Electrical Stimulation;Moist Heat;DME Instruction;Gait training;Stair training;Functional mobility training;Therapeutic activities;Therapeutic exercise;Balance training;Neuromuscular re-education;Patient/family education;Manual techniques;Scar mobilization;Passive range of motion    PT Next Visit Plan  Next session continue with quad strengthening and increasing functional strength. Be careful with eccentric quad strengthening exercises.   Add wallslide with holds.      PT Home Exercise Plan  Ankle pumps supine 3 x 10 2x/day; Heel slides supine 2 x 10 3'' holds 2x/day; SAQ 2 x 10 2'' holds 1x/day; SLR 2 x 10 1x/day; Sidelying hip abduction 2 x 10 1x/day each leg; Quad sets 15 x 5 second holds 1x/day       Patient will benefit from skilled therapeutic intervention in order to improve the following deficits and impairments:  Abnormal gait, Decreased activity tolerance, Decreased endurance, Decreased range of motion, Decreased strength, Hypomobility, Increased fascial restricitons, Improper body mechanics, Pain, Decreased balance, Decreased mobility, Difficulty walking, Increased edema, Impaired flexibility  Visit Diagnosis: Rupture, tendon,  quadriceps, left, subsequent encounter  Decreased range of motion (ROM) of left knee  Decreased strength     Problem List Patient Active Problem List   Diagnosis Date Noted  . Rupture of left quadriceps tendon s/p repair 03/05/17   . Helicobacter pylori gastritis 04/18/2015  . ETOH abuse 04/18/2015  . Liver lesion 04/18/2015  . Essential hypertension, benign 11/04/2013  . Heart murmur 11/04/2013  . Back pain associated with peripheral numbness 08/10/2013  . Upper abdominal pain 03/31/2013  . Hematochezia 07/01/2012   Teena Irani, PTA/CLT [phone]  Teena Irani 07/06/2017, 9:42 AM  [place], [place], [address] Phone: [phone]   Fax:  [phone]  Name: CAELAN BRANDEN MRN: [id number] Date of Birth: [birth year]

## 2017-07-08 ENCOUNTER — Ambulatory Visit (HOSPITAL_COMMUNITY): Payer: Medicare Other | Admitting: Physical Therapy

## 2017-07-08 DIAGNOSIS — S76112D Strain of left quadriceps muscle, fascia and tendon, subsequent encounter: Secondary | ICD-10-CM

## 2017-07-08 DIAGNOSIS — R29898 Other symptoms and signs involving the musculoskeletal system: Secondary | ICD-10-CM

## 2017-07-08 DIAGNOSIS — R2689 Other abnormalities of gait and mobility: Secondary | ICD-10-CM | POA: Diagnosis not present

## 2017-07-08 DIAGNOSIS — R531 Weakness: Secondary | ICD-10-CM

## 2017-07-08 DIAGNOSIS — M25662 Stiffness of left knee, not elsewhere classified: Secondary | ICD-10-CM

## 2017-07-08 NOTE — Patient Instructions (Signed)
  PRONE HIP EXTENSION While lying face down with your knee straight, slowly raise up leg off the ground. Maintain a straight knee the entire time.  Repeat 10 Times Hold 1 Second Complete 2 Sets Perform 1 Time(s) a Day

## 2017-07-08 NOTE — Therapy (Signed)
[place], [place], [address] Phone: [phone]   Fax:  [phone]  Physical Therapy Treatment  Patient Details  Name: Stephen Koch MRN: [id number] Date of Birth: 1961/04/21 Referring Provider: [name], MD   Encounter Date: 07/08/2017  PT End of Session - 07/08/17 0930    Visit Number  9    Number of Visits  19    Date for PT Re-Evaluation  07/24/17    Authorization Type  Medicare Part A     Authorization Time Period  06/10/17 - 07/24/17    Authorization - Visit Number  2    Authorization - Number of Visits  10    PT Start Time  0902    PT Stop Time  0940    PT Time Calculation (min)  38 min    Equipment Utilized During Treatment  -- during wb activities    Activity Tolerance  Patient tolerated treatment well;No increased pain    Behavior During Therapy  WFL for tasks assessed/performed       Past Medical History:  Diagnosis Date  . Arthritis   . Chronic back pain    Forklift injury  . Essential hypertension, benign   . GERD (gastroesophageal reflux disease)   . History of medication noncompliance     Past Surgical History:  Procedure Laterality Date  . COLONOSCOPY N/A 07/06/2012   OIZ:TIWPYK mucosa in the terminal ileum/Moderate sized internal hemorrhoids  . ESOPHAGOGASTRODUODENOSCOPY N/A 04/08/2013   DXI:PJASN gastric ulcer/duodenal inflammation. 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 Left knee surgery Left    arthroscopy  . QUADRICEPS TENDON REPAIR Left 03/05/2017   Procedure: REPAIR QUADRICEP TENDON;  Surgeon: Carole Civil, MD;  Location: [facility];  Service: Orthopedics;  Laterality: Left;  . Right arm surgery     tendon repair    There were no vitals filed for this visit.  Subjective Assessment - 07/08/17 0904    Subjective  Patient stated he has some pressure in his left knee and that he still feels like he has  swelling in his knee above the knee.     Currently in Pain?  Yes    Pain Score  5     Pain Location  Knee    Pain Orientation  Left    Pain Descriptors / Indicators  Pressure    Pain Type  Surgical pain    Pain Onset  More than a month ago    Multiple Pain Sites  No                      [facility] Adult PT Treatment/Exercise - 07/08/17 0001      Knee/Hip Exercises: Stretches   Knee: Self-Stretch to increase Flexion  Left;3 reps;30 seconds;Limitations    Knee: Self-Stretch Limitations  on 12 inch step      Knee/Hip Exercises: Standing   Forward Lunges  Left;Other (comment);15 reps;Limitations    Forward Lunges Limitations  onto 4" step no UE's    Hip Abduction  Stengthening;Left;15 reps    Hip Extension  Both;15 reps;Knee straight;Limitations    Extension Limitations  2# ankle weights    Lateral Step Up  Left;Hand Hold: 0;15 reps;Step Height: 6"    Forward Step Up  Left;Hand Hold: 0;Step Height: 6";15 reps    Functional Squat  15 reps    Wall Squat  10 reps;Limitations    Wall  Squat Limitations  5 second holds    SLS with Vectors  Lt 5X5" each without upper extremity assist      Knee/Hip Exercises: Supine   Straight Leg Raises  20 reps      Knee/Hip Exercises: Sidelying   Hip ABduction  20 reps      Knee/Hip Exercises: Prone   Hip Extension  20 reps      Manual Therapy   Manual Therapy  Edema management;Joint mobilization    Manual therapy comments  10 minutes total Completed separately from all other skilled interventions    Edema Management  Retrograde massage to the left lower extremity supine with leg elevated over bolster for edema reduction    Joint Mobilization  Patella mobilization to patient's tolerance, superior inferior lateral and medial             PT Education - 07/08/17 0919    Education provided  Yes    Education Details  Patient was educated on purpose and technique of exercises throughout session. Updated HEP.    Person(s) Educated   Patient    Methods  Explanation    Comprehension  Verbalized understanding;Verbal cues required       PT Short Term Goals - 07/03/17 1112      PT SHORT TERM GOAL #1   Title  Patient will demonstrate understanding and report regular compliance of HEP.     Time  3    Period  Weeks    Status  On-going      PT SHORT TERM GOAL #2   Title  Patient will demonstrate improved left knee flexion AROM of 10 degrees in order to assist with performance of stair ambulation and gait.     Baseline  07/03/17: Patient demonstrated 115 degrees of left knee flexion AROM.     Time  3    Period  Weeks    Status  Achieved      PT SHORT TERM GOAL #3   Title  Patient will demonstrate improved MMT strength of 1/2 grade in all deficient planes in lower extremities as evidence of improved lower extremity strength in order to assist patient with performing functional activities.     Baseline  07/03/17: Patient improved by 1/2 MMT grades in deficient planes except left knee flexion, and bilateral plantarflexion    Time  3    Period  Weeks    Status  Partially Met        PT Long Term Goals - 07/03/17 1221      PT LONG TERM GOAL #1   Title  Patient will demonstrate left knee AROM that is equal to that of patient's right knee AROM in order for improved mechanics with functional activities.     Baseline  07/03/17: Patient's left knee extension is equal to the right knee, however patient's left knee flexion ROM is 115.    Time  6    Period  Weeks    Status  Partially Met      PT LONG TERM GOAL #2   Title  Patient will demonstrate improved MMT strength of 1 grade in all deficient planes in lower extremities as evidence of improved lower extremity strength in order to assist patient with performing functional activities.     Baseline  07/03/17: Patient achieved this in some, but not all deficient areas    Time  6    Period  Weeks    Status  Partially Met  PT LONG TERM GOAL #3   Title  Patient will perform 5  times sit to stand in less than or equal to 12 seconds as evidence of improved functional mobility and decreased risk of falls.     Baseline  07/03/17: Patient performed 5xSTS in 18.28 seconds.     Time  6    Period  Weeks    Status  On-going      PT LONG TERM GOAL #4   Title  Patient will perform the 3MWT at a velocity of at least 1.2 m/s without significant difference in stance time on the right and left lower extremity without knee brace.     Baseline  07/03/17: Patient ambulated at 1.07 m/s with significant difference in stance time on the right compared to left lower extremities.     Time  3    Period  Weeks    Status  New    Target Date  07/24/17            Plan - 07/08/17 0941    Clinical Impression Statement  This session continued to focus on improving patient's range of motion and strength in the left lower extremity. Session began with manual therapy. Then session progressed to patient performing mat exercises followed by standing exercises. This session added self-stretch for knee flexion to patient's tolerance. In addition added wall squats with 5 second holds this session. Patient stated pain level remained about the same by the end of session. This session added prone hip extension strengthening to patient's home exercise program. Patient would benefit from continued skilled physical therapy to continue progress with ROM, strength and overall functional mobility.     Rehab Potential  Fair    Clinical Impairments Affecting Rehab Potential  Positive: Patient's positive attitude and motivation; Negative: length of time since surgery, history of smoking affecting healing time    PT Frequency  3x / week    PT Duration  6 weeks    PT Treatment/Interventions  ADLs/Self Care Home Management;Aquatic Therapy;Cryotherapy;Electrical Stimulation;Moist Heat;DME Instruction;Gait training;Stair training;Functional mobility training;Therapeutic activities;Therapeutic exercise;Balance  training;Neuromuscular re-education;Patient/family education;Manual techniques;Scar mobilization;Passive range of motion    PT Next Visit Plan  Next session continue with quad strengthening and increasing functional strength. Be careful with eccentric quad strengthening exercises.   Add wallslide with holds.      PT Home Exercise Plan  Ankle pumps supine 3 x 10 2x/day; Heel slides supine 2 x 10 3'' holds 2x/day; SAQ 2 x 10 2'' holds 1x/day; SLR 2 x 10 1x/day; Sidelying hip abduction 2 x 10 1x/day each leg; Quad sets 15 x 5 second holds 1x/day; 07/08/17: Prone hip extension 2 x 10 1x/day    Consulted and Agree with Plan of Care  Patient       Patient will benefit from skilled therapeutic intervention in order to improve the following deficits and impairments:  Abnormal gait, Decreased activity tolerance, Decreased endurance, Decreased range of motion, Decreased strength, Hypomobility, Increased fascial restricitons, Improper body mechanics, Pain, Decreased balance, Decreased mobility, Difficulty walking, Increased edema, Impaired flexibility  Visit Diagnosis: Rupture, tendon, quadriceps, left, subsequent encounter  Decreased range of motion (ROM) of left knee  Decreased strength  Other symptoms and signs involving the musculoskeletal system  Other abnormalities of gait and mobility  Impairment of balance     Problem List Patient Active Problem List   Diagnosis Date Noted  . Rupture of left quadriceps tendon s/p repair 03/05/17   . Helicobacter pylori gastritis 04/18/2015  .  ETOH abuse 04/18/2015  . Liver lesion 04/18/2015  . Essential hypertension, benign 11/04/2013  . Heart murmur 11/04/2013  . Back pain associated with peripheral numbness 08/10/2013  . Upper abdominal pain 03/31/2013  . Hematochezia 07/01/2012   Stephen Koch PT, DPT 9:44 AM, 07/08/17 [place], [place], [address] Phone:  [phone]   Fax:  [phone]  Name: Stephen Koch MRN: [id number] Date of Birth: 1960/09/20

## 2017-07-10 ENCOUNTER — Ambulatory Visit (HOSPITAL_COMMUNITY): Payer: Medicare Other | Admitting: Physical Therapy

## 2017-07-10 ENCOUNTER — Encounter (HOSPITAL_COMMUNITY): Payer: Self-pay | Admitting: Physical Therapy

## 2017-07-10 DIAGNOSIS — R2689 Other abnormalities of gait and mobility: Secondary | ICD-10-CM

## 2017-07-10 DIAGNOSIS — R29898 Other symptoms and signs involving the musculoskeletal system: Secondary | ICD-10-CM

## 2017-07-10 DIAGNOSIS — S76112D Strain of left quadriceps muscle, fascia and tendon, subsequent encounter: Secondary | ICD-10-CM | POA: Diagnosis not present

## 2017-07-10 DIAGNOSIS — M25662 Stiffness of left knee, not elsewhere classified: Secondary | ICD-10-CM

## 2017-07-10 DIAGNOSIS — R531 Weakness: Secondary | ICD-10-CM

## 2017-07-10 NOTE — Therapy (Signed)
[place], [place], [address] Phone: [phone]   Fax:  [phone]  Physical Therapy Treatment  Patient Details  Name: Stephen Koch MRN: [id number] Date of Birth: 1960/09/03 Referring Provider: [name], MD   Encounter Date: 07/10/2017  PT End of Session - 07/10/17 1036    Visit Number  10    Number of Visits  19    Date for PT Re-Evaluation  07/24/17    Authorization Type  Medicare Part A     Authorization Time Period  06/10/17 - 07/24/17    Authorization - Visit Number  3    Authorization - Number of Visits  10    PT Start Time  6314    PT Stop Time  1030    PT Time Calculation (min)  43 min    Equipment Utilized During Treatment  -- during wb activities    Activity Tolerance  Patient tolerated treatment well;No increased pain    Behavior During Therapy  WFL for tasks assessed/performed       Past Medical History:  Diagnosis Date  . Arthritis   . Chronic back pain    Forklift injury  . Essential hypertension, benign   . GERD (gastroesophageal reflux disease)   . History of medication noncompliance     Past Surgical History:  Procedure Laterality Date  . COLONOSCOPY N/A 07/06/2012   HFW:YOVZCH mucosa in the terminal ileum/Moderate sized internal hemorrhoids  . ESOPHAGOGASTRODUODENOSCOPY N/A 04/08/2013   YIF:OYDXA gastric ulcer/duodenal inflammation. 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 Left knee surgery Left    arthroscopy  . QUADRICEPS TENDON REPAIR Left 03/05/2017   Procedure: REPAIR QUADRICEP TENDON;  Surgeon: Carole Civil, MD;  Location: [facility];  Service: Orthopedics;  Laterality: Left;  . Right arm surgery     tendon repair    There were no vitals filed for this visit.  Subjective Assessment - 07/10/17 0958    Subjective  Patient stated he is doing his exercises, he states he feels like he has made improvements.  Patient denied any pain this session.     Currently in Pain?  No/denies                      [facility] Adult PT Treatment/Exercise - 07/10/17 0001      Ambulation/Gait   Ambulation Distance (Feet)  452 Feet    Assistive device  None    Gait Comments  2 minutes at start of session with verbal cues for improved mechanics. Patient ambulated with decreased stance time on the left lower extremity and antalgic gait.       Knee/Hip Exercises: Stretches   Knee: Self-Stretch to increase Flexion  Left;3 reps;30 seconds;Limitations    Knee: Self-Stretch Limitations  on 12 inch step      Knee/Hip Exercises: Standing   Forward Lunges  Left;Other (comment);15 reps;Limitations    Forward Lunges Limitations  onto 4" step no UE's    Hip Abduction  Stengthening;Left;15 reps;Knee straight;Limitations    Abduction Limitations  Red theraband above knee    Hip Extension  Both;15 reps;Knee straight;Limitations    Extension Limitations  2.5# ankle weights; 5 second holds    Lateral Step Up  Left;Hand Hold: 0;15 reps;Step Height: 6"    Forward Step Up  Left;Hand Hold: 0;Step Height: 6";15 reps    Functional Squat  15 reps;Other (comment) Verbal cues and tactile  cues for knees not past toes    Wall Squat  10 reps;Limitations    Wall Squat Limitations  10 second holds    SLS with Vectors  Lt 10X5" front, side, back, with intermittent upper extremity assist      Knee/Hip Exercises: Supine   Straight Leg Raises  20 reps    Knee Extension Limitations  0    Knee Flexion Limitations  117      Knee/Hip Exercises: Sidelying   Hip ABduction  20 reps      Knee/Hip Exercises: Prone   Hip Extension  20 reps      Manual Therapy   Manual Therapy  Soft tissue mobilization    Manual therapy comments  10 minutes total Completed separately from all other skilled interventions    Soft tissue mobilization  Gentle soft tissue mobilization to patient's left quadriceps muscle to promote relaxation and improve  extensibility             PT Education - 07/10/17 1000    Education provided  Yes    Education Details  Patient was educated on purpose and technique of exercises throughout session.     Person(s) Educated  Patient    Methods  Explanation;Demonstration;Tactile cues;Verbal cues    Comprehension  Verbalized understanding;Returned demonstration       PT Short Term Goals - 07/03/17 1112      PT SHORT TERM GOAL #1   Title  Patient will demonstrate understanding and report regular compliance of HEP.     Time  3    Period  Weeks    Status  On-going      PT SHORT TERM GOAL #2   Title  Patient will demonstrate improved left knee flexion AROM of 10 degrees in order to assist with performance of stair ambulation and gait.     Baseline  07/03/17: Patient demonstrated 115 degrees of left knee flexion AROM.     Time  3    Period  Weeks    Status  Achieved      PT SHORT TERM GOAL #3   Title  Patient will demonstrate improved MMT strength of 1/2 grade in all deficient planes in lower extremities as evidence of improved lower extremity strength in order to assist patient with performing functional activities.     Baseline  07/03/17: Patient improved by 1/2 MMT grades in deficient planes except left knee flexion, and bilateral plantarflexion    Time  3    Period  Weeks    Status  Partially Met        PT Long Term Goals - 07/03/17 1221      PT LONG TERM GOAL #1   Title  Patient will demonstrate left knee AROM that is equal to that of patient's right knee AROM in order for improved mechanics with functional activities.     Baseline  07/03/17: Patient's left knee extension is equal to the right knee, however patient's left knee flexion ROM is 115.    Time  6    Period  Weeks    Status  Partially Met      PT LONG TERM GOAL #2   Title  Patient will demonstrate improved MMT strength of 1 grade in all deficient planes in lower extremities as evidence of improved lower extremity strength in  order to assist patient with performing functional activities.     Baseline  07/03/17: Patient achieved this in some, but not all deficient areas  Time  6    Period  Weeks    Status  Partially Met      PT LONG TERM GOAL #3   Title  Patient will perform 5 times sit to stand in less than or equal to 12 seconds as evidence of improved functional mobility and decreased risk of falls.     Baseline  07/03/17: Patient performed 5xSTS in 18.28 seconds.     Time  6    Period  Weeks    Status  On-going      PT LONG TERM GOAL #4   Title  Patient will perform the 3MWT at a velocity of at least 1.2 m/s without significant difference in stance time on the right and left lower extremity without knee brace.     Baseline  07/03/17: Patient ambulated at 1.07 m/s with significant difference in stance time on the right compared to left lower extremities.     Time  3    Period  Weeks    Status  New    Target Date  07/24/17            Plan - 07/10/17 1036    Clinical Impression Statement  This session continued to progress patient with knee flexion range of motion and with functional strengthening. Session began with patient ambulating around the gym 2 times to warm up with therapist providing verbal cues for improved form. Patient then performed mat exercises. Then patient performed standing exercises. This session, patient performed hip abduction with red theraband in standing, patient also held wall squats for 10 seconds instead of 5 seconds this session. Patient demonstrated improved knee flexion ROM to 117 degrees this session. Patient would benefit from continued skilled physical therapy to address remaining deficits in strength, range of motion and functional mobility.     Rehab Potential  Fair    Clinical Impairments Affecting Rehab Potential  Positive: Patient's positive attitude and motivation; Negative: length of time since surgery, history of smoking affecting healing time    PT Frequency  3x /  week    PT Duration  6 weeks    PT Treatment/Interventions  ADLs/Self Care Home Management;Aquatic Therapy;Cryotherapy;Electrical Stimulation;Moist Heat;DME Instruction;Gait training;Stair training;Functional mobility training;Therapeutic activities;Therapeutic exercise;Balance training;Neuromuscular re-education;Patient/family education;Manual techniques;Scar mobilization;Passive range of motion    PT Next Visit Plan  Next session continue with quad strengthening and increasing functional strength. Be careful with eccentric quad strengthening exercises.  Observe stair ambulation and practice if necessary. Continue wallslide with holds.     PT Home Exercise Plan  Ankle pumps supine 3 x 10 2x/day; Heel slides supine 2 x 10 3'' holds 2x/day; SAQ 2 x 10 2'' holds 1x/day; SLR 2 x 10 1x/day; Sidelying hip abduction 2 x 10 1x/day each leg; Quad sets 15 x 5 second holds 1x/day; 07/08/17: Prone hip extension 2 x 10 1x/day    Consulted and Agree with Plan of Care  Patient       Patient will benefit from skilled therapeutic intervention in order to improve the following deficits and impairments:  Abnormal gait, Decreased activity tolerance, Decreased endurance, Decreased range of motion, Decreased strength, Hypomobility, Increased fascial restricitons, Improper body mechanics, Pain, Decreased balance, Decreased mobility, Difficulty walking, Increased edema, Impaired flexibility  Visit Diagnosis: Rupture, tendon, quadriceps, left, subsequent encounter  Decreased range of motion (ROM) of left knee  Decreased strength  Other symptoms and signs involving the musculoskeletal system  Other abnormalities of gait and mobility  Impairment of balance  Problem List Patient Active Problem List   Diagnosis Date Noted  . Rupture of left quadriceps tendon s/p repair 03/05/17   . Helicobacter pylori gastritis 04/18/2015  . ETOH abuse 04/18/2015  . Liver lesion 04/18/2015  . Essential hypertension, benign  11/04/2013  . Heart murmur 11/04/2013  . Back pain associated with peripheral numbness 08/10/2013  . Upper abdominal pain 03/31/2013  . Hematochezia 07/01/2012   Clarene Critchley PT, DPT 10:45 AM, 07/10/17 [place], [place], [address] Phone: [phone]   Fax:  [phone]  Name: Stephen Koch MRN: [id number] Date of Birth: [birth year]

## 2017-07-13 ENCOUNTER — Ambulatory Visit (HOSPITAL_COMMUNITY): Payer: Medicare Other | Admitting: Physical Therapy

## 2017-07-13 DIAGNOSIS — R2689 Other abnormalities of gait and mobility: Secondary | ICD-10-CM | POA: Diagnosis not present

## 2017-07-13 DIAGNOSIS — M25662 Stiffness of left knee, not elsewhere classified: Secondary | ICD-10-CM | POA: Diagnosis not present

## 2017-07-13 DIAGNOSIS — R531 Weakness: Secondary | ICD-10-CM | POA: Diagnosis not present

## 2017-07-13 DIAGNOSIS — S76112D Strain of left quadriceps muscle, fascia and tendon, subsequent encounter: Secondary | ICD-10-CM

## 2017-07-13 DIAGNOSIS — R29898 Other symptoms and signs involving the musculoskeletal system: Secondary | ICD-10-CM | POA: Diagnosis not present

## 2017-07-13 NOTE — Therapy (Signed)
[place], [place], [address] Phone: [phone]   Fax:  [phone]  Physical Therapy Treatment  Patient Details  Name: Stephen Koch MRN: [id number] Date of Birth: 1961-04-11 Referring Provider: [name], MD   Encounter Date: 07/13/2017  PT End of Session - 07/13/17 0946    Visit Number  11    Number of Visits  19    Date for PT Re-Evaluation  07/24/17    Authorization Type  Medicare Part A     Authorization Time Period  06/10/17 - 07/24/17    Authorization - Visit Number  3    Authorization - Number of Visits  10    PT Start Time  0900    PT Stop Time  0940    PT Time Calculation (min)  40 min    Equipment Utilized During Treatment  -- during wb activities    Activity Tolerance  Patient tolerated treatment well;No increased pain    Behavior During Therapy  WFL for tasks assessed/performed       Past Medical History:  Diagnosis Date  . Arthritis   . Chronic back pain    Forklift injury  . Essential hypertension, benign   . GERD (gastroesophageal reflux disease)   . History of medication noncompliance     Past Surgical History:  Procedure Laterality Date  . COLONOSCOPY N/A 07/06/2012   TJQ:ZESPQZ mucosa in the terminal ileum/Moderate sized internal hemorrhoids  . ESOPHAGOGASTRODUODENOSCOPY N/A 04/08/2013   RAQ:TMAUQ gastric ulcer/duodenal inflammation. 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 Left knee surgery Left    arthroscopy  . QUADRICEPS TENDON REPAIR Left 03/05/2017   Procedure: REPAIR QUADRICEP TENDON;  Surgeon: Carole Civil, MD;  Location: [facility];  Service: Orthopedics;  Laterality: Left;  . Right arm surgery     tendon repair    There were no vitals filed for this visit.  Subjective Assessment - 07/13/17 0930    Subjective  Pt states he's doing really good, no real restrictions or issues at home.  SOme weakness with  prolonged activity but otherwise good. No pain.    Currently in Pain?  No/denies    Pain Score  0-No pain                      [facility] Adult PT Treatment/Exercise - 07/13/17 0001      Ambulation/Gait   Ambulation Distance (Feet)  452 Feet 2 laps for warm up/normalizing gait    Assistive device  None      Knee/Hip Exercises: Stretches   Knee: Self-Stretch to increase Flexion  Left;3 reps;30 seconds;Limitations    Knee: Self-Stretch Limitations  on 12 inch step      Knee/Hip Exercises: Standing   Forward Lunges  Left;Other (comment);15 reps;Limitations    Forward Lunges Limitations  onto 4" step no UE's    Hip Abduction  Stengthening;Left;15 reps;Knee straight;Limitations    Abduction Limitations  Red theraband below knee    Hip Extension  Both;15 reps;Knee straight;Limitations    Extension Limitations  red theraband below knee    Lateral Step Up  Left;Hand Hold: 0;15 reps;Step Height: 6"    Forward Step Up  Left;Hand Hold: 0;Step Height: 6";15 reps    Step Down  Left;10 reps;Hand Hold: 1;Step Height: 4"    Wall Squat  10 reps;Limitations    Wall Squat Limitations  10 second holds  Stairs  5RT 7" step without HR reciprocally    SLS  max 30" without UE assist, noted increased knee flexion from weakness    SLS with Vectors  Lt 10X5" front, side, back, with intermittent upper extremity assist    Other Standing Knee Exercises  2RT each balance beam side stepping and tandem               PT Short Term Goals - 07/03/17 1112      PT SHORT TERM GOAL #1   Title  Patient will demonstrate understanding and report regular compliance of HEP.     Time  3    Period  Weeks    Status  On-going      PT SHORT TERM GOAL #2   Title  Patient will demonstrate improved left knee flexion AROM of 10 degrees in order to assist with performance of stair ambulation and gait.     Baseline  07/03/17: Patient demonstrated 115 degrees of left knee flexion AROM.     Time  3    Period   Weeks    Status  Achieved      PT SHORT TERM GOAL #3   Title  Patient will demonstrate improved MMT strength of 1/2 grade in all deficient planes in lower extremities as evidence of improved lower extremity strength in order to assist patient with performing functional activities.     Baseline  07/03/17: Patient improved by 1/2 MMT grades in deficient planes except left knee flexion, and bilateral plantarflexion    Time  3    Period  Weeks    Status  Partially Met        PT Long Term Goals - 07/03/17 1221      PT LONG TERM GOAL #1   Title  Patient will demonstrate left knee AROM that is equal to that of patient's right knee AROM in order for improved mechanics with functional activities.     Baseline  07/03/17: Patient's left knee extension is equal to the right knee, however patient's left knee flexion ROM is 115.    Time  6    Period  Weeks    Status  Partially Met      PT LONG TERM GOAL #2   Title  Patient will demonstrate improved MMT strength of 1 grade in all deficient planes in lower extremities as evidence of improved lower extremity strength in order to assist patient with performing functional activities.     Baseline  07/03/17: Patient achieved this in some, but not all deficient areas    Time  6    Period  Weeks    Status  Partially Met      PT LONG TERM GOAL #3   Title  Patient will perform 5 times sit to stand in less than or equal to 12 seconds as evidence of improved functional mobility and decreased risk of falls.     Baseline  07/03/17: Patient performed 5xSTS in 18.28 seconds.     Time  6    Period  Weeks    Status  On-going      PT LONG TERM GOAL #4   Title  Patient will perform the 3MWT at a velocity of at least 1.2 m/s without significant difference in stance time on the right and left lower extremity without knee brace.     Baseline  07/03/17: Patient ambulated at 1.07 m/s with significant difference in stance time on the right compared to left lower  extremities.      Time  3    Period  Weeks    Status  New    Target Date  07/24/17            Plan - 07/13/17 0946    Clinical Impression Statement  Focus mainly on improving functional strength and stability in knee this session.  Began step downs with 4" step with careful attention to form and symtpoms.  No pain reported with actvity, only weakness.  Worked on Human resources officer without UE assist, completing reciprocally.  Challenged balance with addition of tandem/side stepping on balance beam with noted challenge.  Able to complete 30" with SLS on Lt but with noted weakness.  Pt without pain at EOS.    Rehab Potential  Fair    Clinical Impairments Affecting Rehab Potential  Positive: Patient's positive attitude and motivation; Negative: length of time since surgery, history of smoking affecting healing time    PT Frequency  3x / week    PT Duration  6 weeks    PT Treatment/Interventions  ADLs/Self Care Home Management;Aquatic Therapy;Cryotherapy;Electrical Stimulation;Moist Heat;DME Instruction;Gait training;Stair training;Functional mobility training;Therapeutic activities;Therapeutic exercise;Balance training;Neuromuscular re-education;Patient/family education;Manual techniques;Scar mobilization;Passive range of motion    PT Next Visit Plan  Next session continue with quad strengthening and increasing functional strength. Be careful with eccentric quad strengthening exercises.   Continue wallslide with holds.   Increase challenge of lunges and squats with use of BOSU ball    PT Home Exercise Plan  Ankle pumps supine 3 x 10 2x/day; Heel slides supine 2 x 10 3'' holds 2x/day; SAQ 2 x 10 2'' holds 1x/day; SLR 2 x 10 1x/day; Sidelying hip abduction 2 x 10 1x/day each leg; Quad sets 15 x 5 second holds 1x/day; 07/08/17: Prone hip extension 2 x 10 1x/day    Consulted and Agree with Plan of Care  Patient       Patient will benefit from skilled therapeutic intervention in order to improve the following  deficits and impairments:  Abnormal gait, Decreased activity tolerance, Decreased endurance, Decreased range of motion, Decreased strength, Hypomobility, Increased fascial restricitons, Improper body mechanics, Pain, Decreased balance, Decreased mobility, Difficulty walking, Increased edema, Impaired flexibility  Visit Diagnosis: Rupture, tendon, quadriceps, left, subsequent encounter  Decreased range of motion (ROM) of left knee  Decreased strength     Problem List Patient Active Problem List   Diagnosis Date Noted  . Rupture of left quadriceps tendon s/p repair 03/05/17   . Helicobacter pylori gastritis 04/18/2015  . ETOH abuse 04/18/2015  . Liver lesion 04/18/2015  . Essential hypertension, benign 11/04/2013  . Heart murmur 11/04/2013  . Back pain associated with peripheral numbness 08/10/2013  . Upper abdominal pain 03/31/2013  . Hematochezia 07/01/2012   Teena Irani, PTA/CLT [phone]  Teena Irani 07/13/2017, 10:19 AM  [place], [place], [address] Phone: [phone]   Fax:  [phone]  Name: RICKARD KENNERLY MRN: [id number] Date of Birth: 05/08/1960

## 2017-07-15 ENCOUNTER — Ambulatory Visit (HOSPITAL_COMMUNITY): Payer: Medicare Other

## 2017-07-17 ENCOUNTER — Ambulatory Visit (HOSPITAL_COMMUNITY): Payer: Medicare Other | Admitting: Physical Therapy

## 2017-07-17 ENCOUNTER — Encounter (HOSPITAL_COMMUNITY): Payer: Self-pay | Admitting: Physical Therapy

## 2017-07-17 DIAGNOSIS — R29898 Other symptoms and signs involving the musculoskeletal system: Secondary | ICD-10-CM | POA: Diagnosis not present

## 2017-07-17 DIAGNOSIS — M25662 Stiffness of left knee, not elsewhere classified: Secondary | ICD-10-CM | POA: Diagnosis not present

## 2017-07-17 DIAGNOSIS — S76112D Strain of left quadriceps muscle, fascia and tendon, subsequent encounter: Secondary | ICD-10-CM

## 2017-07-17 DIAGNOSIS — R531 Weakness: Secondary | ICD-10-CM

## 2017-07-17 DIAGNOSIS — R2689 Other abnormalities of gait and mobility: Secondary | ICD-10-CM | POA: Diagnosis not present

## 2017-07-17 NOTE — Patient Instructions (Signed)
  WALL SQUATS Leaning up against a wall or closed door on your back, slide your body downward and then return back to upright position. A door was used here because it was smoother and had less friction than the wall. Knees should bend in line with the 2nd toe and not pass the front of the foot. Hold for 5 seconds and come up. Discontinue if very painful. Repeat 10 Times Hold 5 Seconds Complete 1 Set Perform 1 Time(s) a Day

## 2017-07-17 NOTE — Therapy (Signed)
[facility] [address] [place], [place], [address] Phone: [phone]   Fax:  [phone]  Physical Therapy Treatment / Re-assessment / Discharge Summary  Patient Details  Name: Stephen Koch MRN: [id number] Date of Birth: 1960-10-02 Referring Provider: [name] MD   Encounter Date: 07/17/2017  PT End of Session - 07/17/17 1035    Visit Number  12    Number of Visits  19    Date for PT Re-Evaluation  07/24/17    Authorization Type  Medicare Part A     Authorization Time Period  06/10/17 - 07/24/17    Authorization - Visit Number  4    Authorization - Number of Visits  10    PT Start Time  0950    PT Stop Time  1020    PT Time Calculation (min)  30 min    Equipment Utilized During Treatment  --    Activity Tolerance  Patient tolerated treatment well;No increased pain    Behavior During Therapy  WFL for tasks assessed/performed       Past Medical History:  Diagnosis Date  . Arthritis   . Chronic back pain    Forklift injury  . Essential hypertension, benign   . GERD (gastroesophageal reflux disease)   . History of medication noncompliance     Past Surgical History:  Procedure Laterality Date  . COLONOSCOPY N/A 07/06/2012   BVQ:XIHWTU mucosa in the terminal ileum/Moderate sized internal hemorrhoids  . ESOPHAGOGASTRODUODENOSCOPY N/A 04/08/2013   UEK:CMKLK gastric ulcer/duodenal inflammation. 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 Left knee surgery Left    arthroscopy  . QUADRICEPS TENDON REPAIR Left 03/05/2017   Procedure: REPAIR QUADRICEP TENDON;  Surgeon: Carole Civil, MD;  Location: [facility];  Service: Orthopedics;  Laterality: Left;  . Right arm surgery     tendon repair    There were no vitals filed for this visit.  Subjective Assessment - 07/17/17 1032    Subjective  Patient reported that he is doing much better than when he started therapy. He  reported he isn't having any major issues, feels that he can continue exercises and progress on his own,  and feels like he is ready to be discharged.    Currently in Pain?  No/denies    Multiple Pain Sites  No         [facility] PT Assessment - 07/17/17 0001      Assessment   Medical Diagnosis  Rupture of left quadriceps tendon, subsequent encounter. S/p repair left quadriceps tendon    Referring Provider  [name] MD    Onset Date/Surgical Date  03/05/17    Next MD Visit  07/21/17      Prior Function   Level of Independence  Independent      Cognition   Overall Cognitive Status  Within Functional Limits for tasks assessed      Observation/Other Assessments   Observations  Noted some remaining quadriceps atrophy, but swelling has decreased around left knee    Focus on Therapeutic Outcomes (FOTO)   60% (40% limited)      Circumferential Edema   Circumferential - Right  40 cm mid patella    Circumferential - Left   40 cm mid patella      AROM   Right Knee Extension  0    Right Knee Flexion  120    Left Knee Extension  0  Left Knee Flexion  121      Strength   Right Hip Flexion  5/5    Right Hip Extension  5/5    Right Hip ABduction  5/5    Left Hip Flexion  5/5    Left Hip Extension  5/5    Left Hip ABduction  5/5    Right Knee Flexion  5/5    Right Knee Extension  5/5    Left Knee Flexion  5/5    Left Knee Extension  5/5    Right Ankle Dorsiflexion  5/5    Right Ankle Plantar Flexion  5/5    Left Ankle Dorsiflexion  5/5    Left Ankle Plantar Flexion  5/5      Palpation   Patella mobility  Moves with minimal limitation superiorly, inferiorly, medially, and laterally    Palpation comment  Slight tenderness at quadriceps      Ambulation/Gait   Ambulation Distance (Feet)  926 Feet 3 MWT    Assistive device  None    Gait Pattern  Within Functional Limits    Ambulation Surface  Level    Gait velocity  1.57 m/s    Stair Management Technique  No  rails;Alternating pattern    Number of Stairs  16    Height of Stairs  6 inches      Static Standing Balance   Static Standing - Balance Support  No upper extremity supported    Static Standing - Level of Assistance  7: Independent    Static Standing Balance -  Activities   Single Leg Stance - Right Leg;Single Leg Stance - Left Leg    Static Standing - Comment/# of Minutes  Single leg stance Rt. 29.98 seconds; Lt. 42.76 seconds      Standardized Balance Assessment   Five times sit to stand comments   Performed in 10.10 seconds without upper extremity assistance                          PT Education - 07/17/17 1034    Education provided  Yes    Education Details  Patient was educated on results of re-assessment and patient was educated on which exercises are important to continue at home and on an update of the home exercise program.     Person(s) Educated  Patient    Methods  Explanation;Demonstration;Verbal cues;Handout    Comprehension  Verbalized understanding;Verbal cues required       PT Short Term Goals - 07/17/17 1026      PT SHORT TERM GOAL #1   Title  Patient will demonstrate understanding and report regular compliance of HEP.     Time  3    Period  Weeks    Status  On-going      PT SHORT TERM GOAL #2   Title  Patient will demonstrate improved left knee flexion AROM of 10 degrees in order to assist with performance of stair ambulation and gait.     Baseline  07/17/17: Patient demonstrated 120 degrees of left knee flexion AROM.     Time  3    Period  Weeks    Status  Achieved      PT SHORT TERM GOAL #3   Title  Patient will demonstrate improved MMT strength of 1/2 grade in all deficient planes in lower extremities as evidence of improved lower extremity strength in order to assist patient with performing functional activities.  Baseline  07/17/17: Patient demonstrated improvement of 1/2 MMT strength in all deficient planes.     Time  3    Period   Weeks    Status  Achieved        PT Long Term Goals - 07/17/17 1027      PT LONG TERM GOAL #1   Title  Patient will demonstrate left knee AROM that is equal to that of patient's right knee AROM in order for improved mechanics with functional activities.     Baseline  07/17/17: Patient's left knee extension and flexion range of motion are equal or greater than that of the right knee AROM.     Time  6    Period  Weeks    Status  Achieved      PT LONG TERM GOAL #2   Title  Patient will demonstrate improved MMT strength of 1 grade in all deficient planes in lower extremities as evidence of improved lower extremity strength in order to assist patient with performing functional activities.     Baseline  07/17/17: Patient demonstrated 5/5 strength in all previously deficient planes at this session    Time  6    Period  Weeks    Status  Achieved      PT LONG TERM GOAL #3   Title  Patient will perform 5 times sit to stand in less than or equal to 12 seconds as evidence of improved functional mobility and decreased risk of falls.     Baseline  07/17/17: Patient performed the 5 times sit to stand in 10.10 seconds.     Time  6    Period  Weeks    Status  Achieved      PT LONG TERM GOAL #4   Title  Patient will perform the 3MWT at a velocity of at least 1.2 m/s without significant difference in stance time on the right and left lower extremity without knee brace.     Baseline  07/17/17: Patient ambulated at a velocity of 1.57 m/s with no noticible difference in stance time on the right compared to left lower extremity.     Time  3    Period  Weeks    Status  Achieved            Plan - 07/17/17 1036    Clinical Impression Statement  This session performed a re-assessment of patient's goals and progress with therapy. Patient achieved 2 out of 3 of his short term goals and just is continuing to progress with his home exercises. Patient achieved all 4 of his long term goals. Patient  demonstrated improvement on his FOTO score of 10%. Patient also stated that he feels that he is ready to be discharged and that he can continue progressing safely at home. Patient was educated on an updated home exercise program, eliminating some exercises, and emphasizing others as well as adding a new exercise that patient has demonstrated safely in therapy. Patient was also educated on access to the [facility] and given information about joining. Patient was informed of how to contact the clinic and told to call if he has any questions or concerns. Patient is being discharged at this time due to meeting the majority of his goals and being pleased with his current functional level.     Rehab Potential  Fair    Clinical Impairments Affecting Rehab Potential  Positive: Patient's positive attitude and motivation; Negative: length of time since surgery, history of smoking affecting  healing time    PT Frequency  3x / week    PT Duration  6 weeks    PT Treatment/Interventions  ADLs/Self Care Home Management;Aquatic Therapy;Cryotherapy;Electrical Stimulation;Moist Heat;DME Instruction;Gait training;Stair training;Functional mobility training;Therapeutic activities;Therapeutic exercise;Balance training;Neuromuscular re-education;Patient/family education;Manual techniques;Scar mobilization;Passive range of motion    PT Next Visit Plan  Patient is being discharged at this time    PT Home Exercise Plan  Ankle pumps supine 3 x 10 2x/day; Heel slides supine 2 x 10 3'' holds 2x/day; SAQ 2 x 10 2'' holds 1x/day; SLR 2 x 10 1x/day; Sidelying hip abduction 2 x 10 1x/day each leg; Quad sets 15 x 5 second holds 1x/day; 07/08/17: Prone hip extension 2 x 10 1x/day; 07/17/17: Removed ankle pumps, decreased frequency of heel slides to 1x/day and added wall squats x 10 holding for 5 seconds 1 x/day    Consulted and Agree with Plan of Care  Patient       Patient will benefit from skilled therapeutic intervention in order to improve  the following deficits and impairments:  Abnormal gait, Decreased activity tolerance, Decreased endurance, Decreased range of motion, Decreased strength, Hypomobility, Increased fascial restricitons, Improper body mechanics, Pain, Decreased balance, Decreased mobility, Difficulty walking, Increased edema, Impaired flexibility  Visit Diagnosis: Rupture, tendon, quadriceps, left, subsequent encounter  Decreased range of motion (ROM) of left knee  Decreased strength  Other symptoms and signs involving the musculoskeletal system  Other abnormalities of gait and mobility  Impairment of balance     Problem List Patient Active Problem List   Diagnosis Date Noted  . Rupture of left quadriceps tendon s/p repair 03/05/17   . Helicobacter pylori gastritis 04/18/2015  . ETOH abuse 04/18/2015  . Liver lesion 04/18/2015  . Essential hypertension, benign 11/04/2013  . Heart murmur 11/04/2013  . Back pain associated with peripheral numbness 08/10/2013  . Upper abdominal pain 03/31/2013  . Hematochezia 07/01/2012   PHYSICAL THERAPY DISCHARGE SUMMARY  Visits from Start of Care: 12  Current functional level related to goals / functional outcomes: See above   Remaining deficits: See above   Education / Equipment: See above. Patient was educated on updated home exercise program and joining the [facility].  Plan: Patient agrees to discharge.  Patient goals were partially met. Patient is being discharged due to being pleased with the current functional level.  ????? and meeting the majority of his goals          Clarene Critchley PT, DPT 10:45 AM, 07/17/17 [place], [place], [address] Phone: [phone]   Fax:  [phone]  Name: Stephen Koch MRN: [id number] Date of Birth: 1960/08/20

## 2017-07-20 ENCOUNTER — Encounter: Payer: Self-pay | Admitting: Orthopedic Surgery

## 2017-07-20 ENCOUNTER — Ambulatory Visit (INDEPENDENT_AMBULATORY_CARE_PROVIDER_SITE_OTHER): Payer: Medicare Other | Admitting: Orthopedic Surgery

## 2017-07-20 VITALS — BP 161/89 | HR 61 | Ht 75.0 in | Wt 206.0 lb

## 2017-07-20 DIAGNOSIS — S76112D Strain of left quadriceps muscle, fascia and tendon, subsequent encounter: Secondary | ICD-10-CM

## 2017-07-20 NOTE — Progress Notes (Signed)
Progress Note   Patient ID: Stephen Koch, male   DOB: [birth year], 58 y.o.   MRN: [id number]  Chief Complaint  Patient presents with  . Leg Injury    follow up left quad tendon repair 03/05/18    Approximately 4 months after quadriceps tendon repair left knee  Patient reports he is improving.  He finished his physical therapy  He has 0-105 degree range of motion  He has active quadriceps activity with good mobility of his patella  We went to place him in a economy hinged brace and see him in 2 months    ROS Current Meds  Medication Sig  . amLODipine (NORVASC) 5 MG tablet   . Magnesium Salicylate (DOANS PILLS PO) Take 1 tablet daily by mouth.  . Menthol, Topical Analgesic, (BENGAY EX) Apply 1 application at bedtime as needed topically (pain).  . methocarbamol (ROBAXIN) 500 MG tablet   . naproxen (NAPROSYN) 500 MG tablet   . sildenafil (VIAGRA) 100 MG tablet Take 100 mg by mouth daily as needed for erectile dysfunction.    Allergies  Allergen Reactions  . Ibuprofen Other (See Comments)    Causes gastric bleeding     BP (!) 161/89   Pulse 61   Ht 6\' 3"  (1.905 m)   Wt 206 lb (93.4 kg)   BMI 25.75 kg/m   Medical decision-making Encounter Diagnosis  Name Primary?  . Rupture of left quadriceps tendon, subsequent encounter s/p repair 03/05/17 Yes   No orders of the defined types were placed in this encounter.    [name], MD 07/20/2017 11:14 AM

## 2017-07-21 ENCOUNTER — Telehealth: Payer: Self-pay | Admitting: Orthopedic Surgery

## 2017-07-21 NOTE — Telephone Encounter (Signed)
Patient is asking for a refill of Hydrocodone.   PATIENT USES [facility]

## 2017-07-22 NOTE — Telephone Encounter (Signed)
Patient called back and I advised him of what Dr. Aline Brochure had to say regarding medication.  He understood.

## 2017-07-22 NOTE — Telephone Encounter (Signed)
Dr. Aline Brochure wants him to use Naproxen and Tylenol. He is 4 mo s/p Engineer, agricultural. I have called him to advise. I called / phone rang once then line cut out

## 2017-08-11 IMAGING — DX DG CHEST 2V
2 series · 2 of 2 positions shown · non-contrast
Comparison: Chest x-ray of January 15, 2016

CLINICAL DATA: Body aches, headache, nausea and vomiting with sore
throat. Productive cough for the past 3 days. Current smoker.

EXAM:
CHEST  2 VIEW

[chest pa]
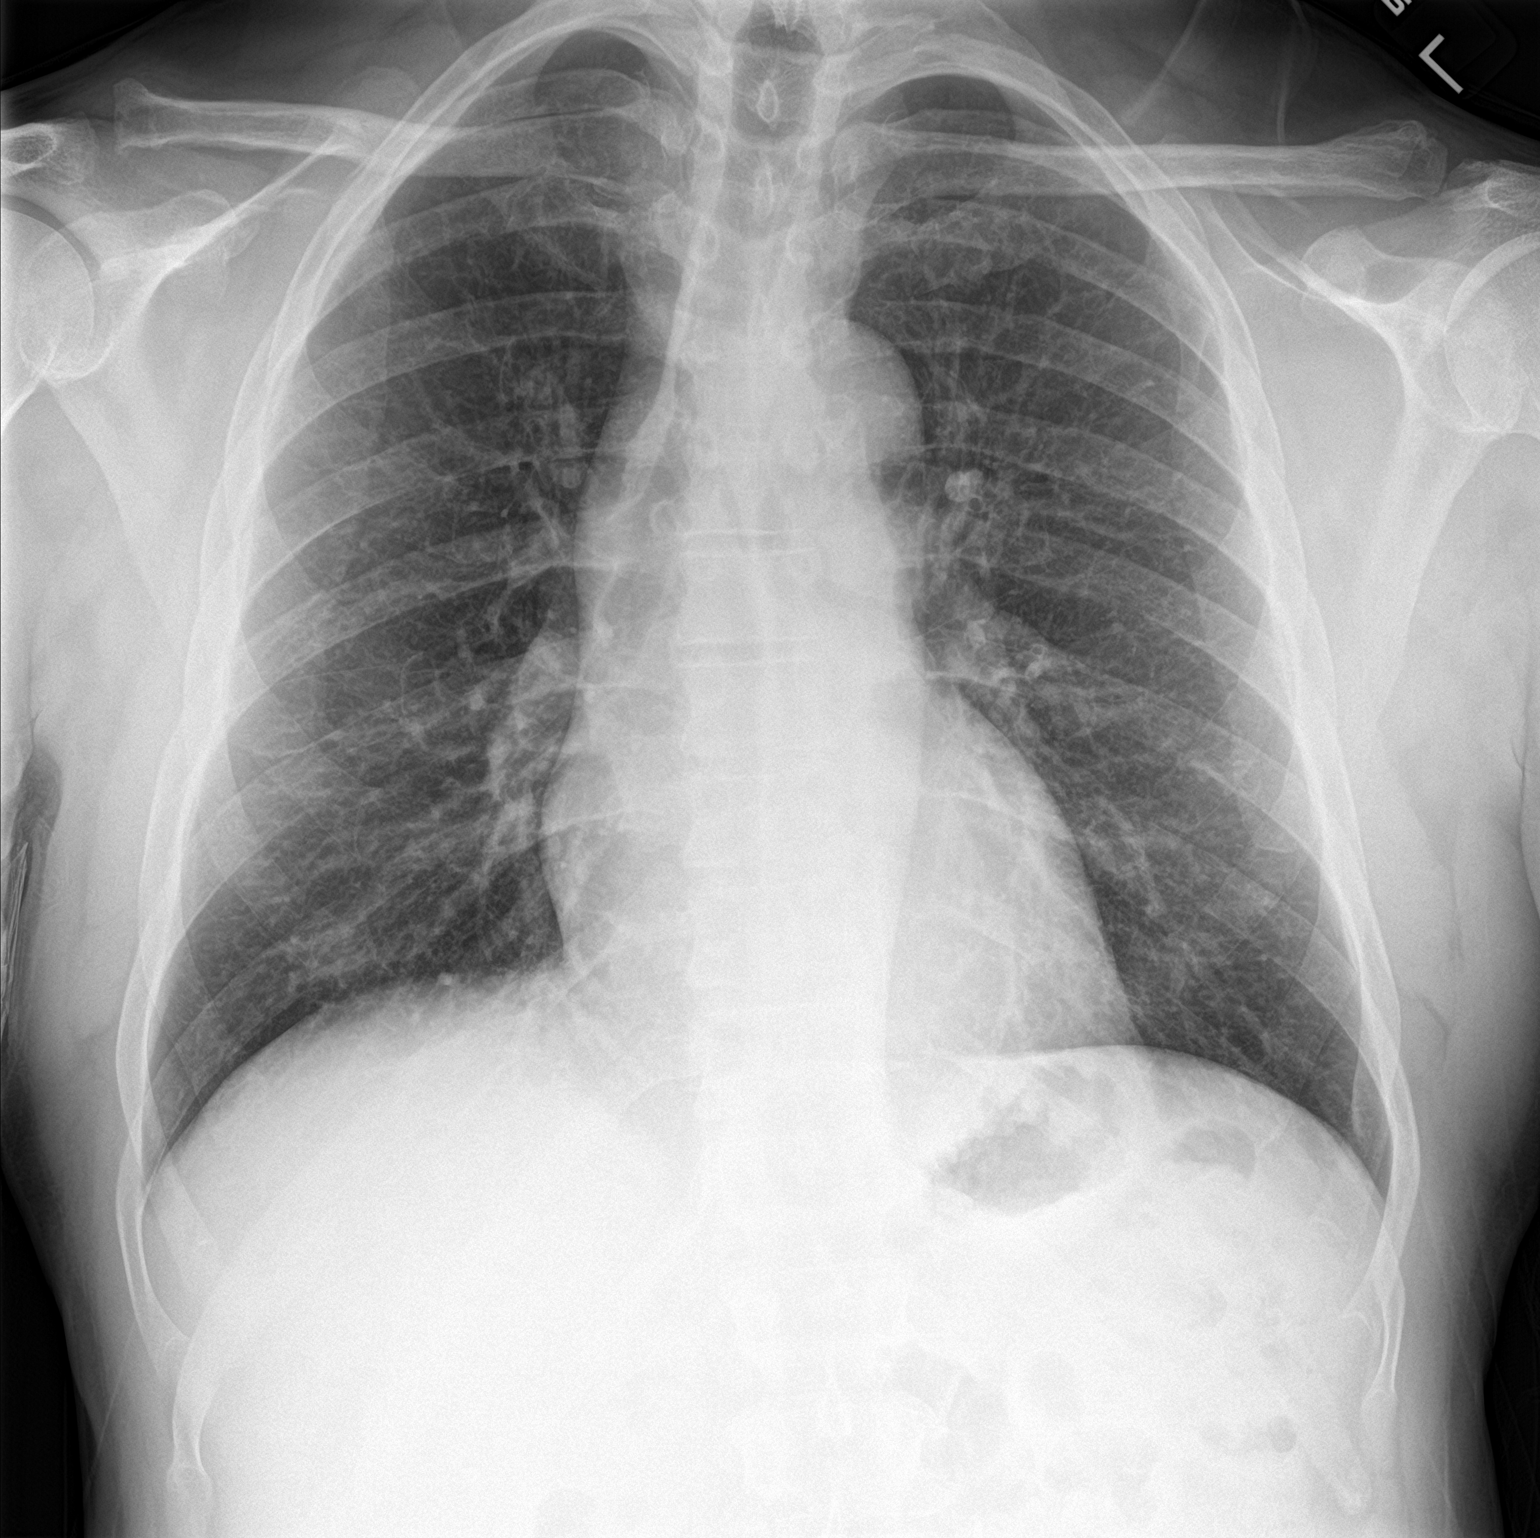

[chest lat]
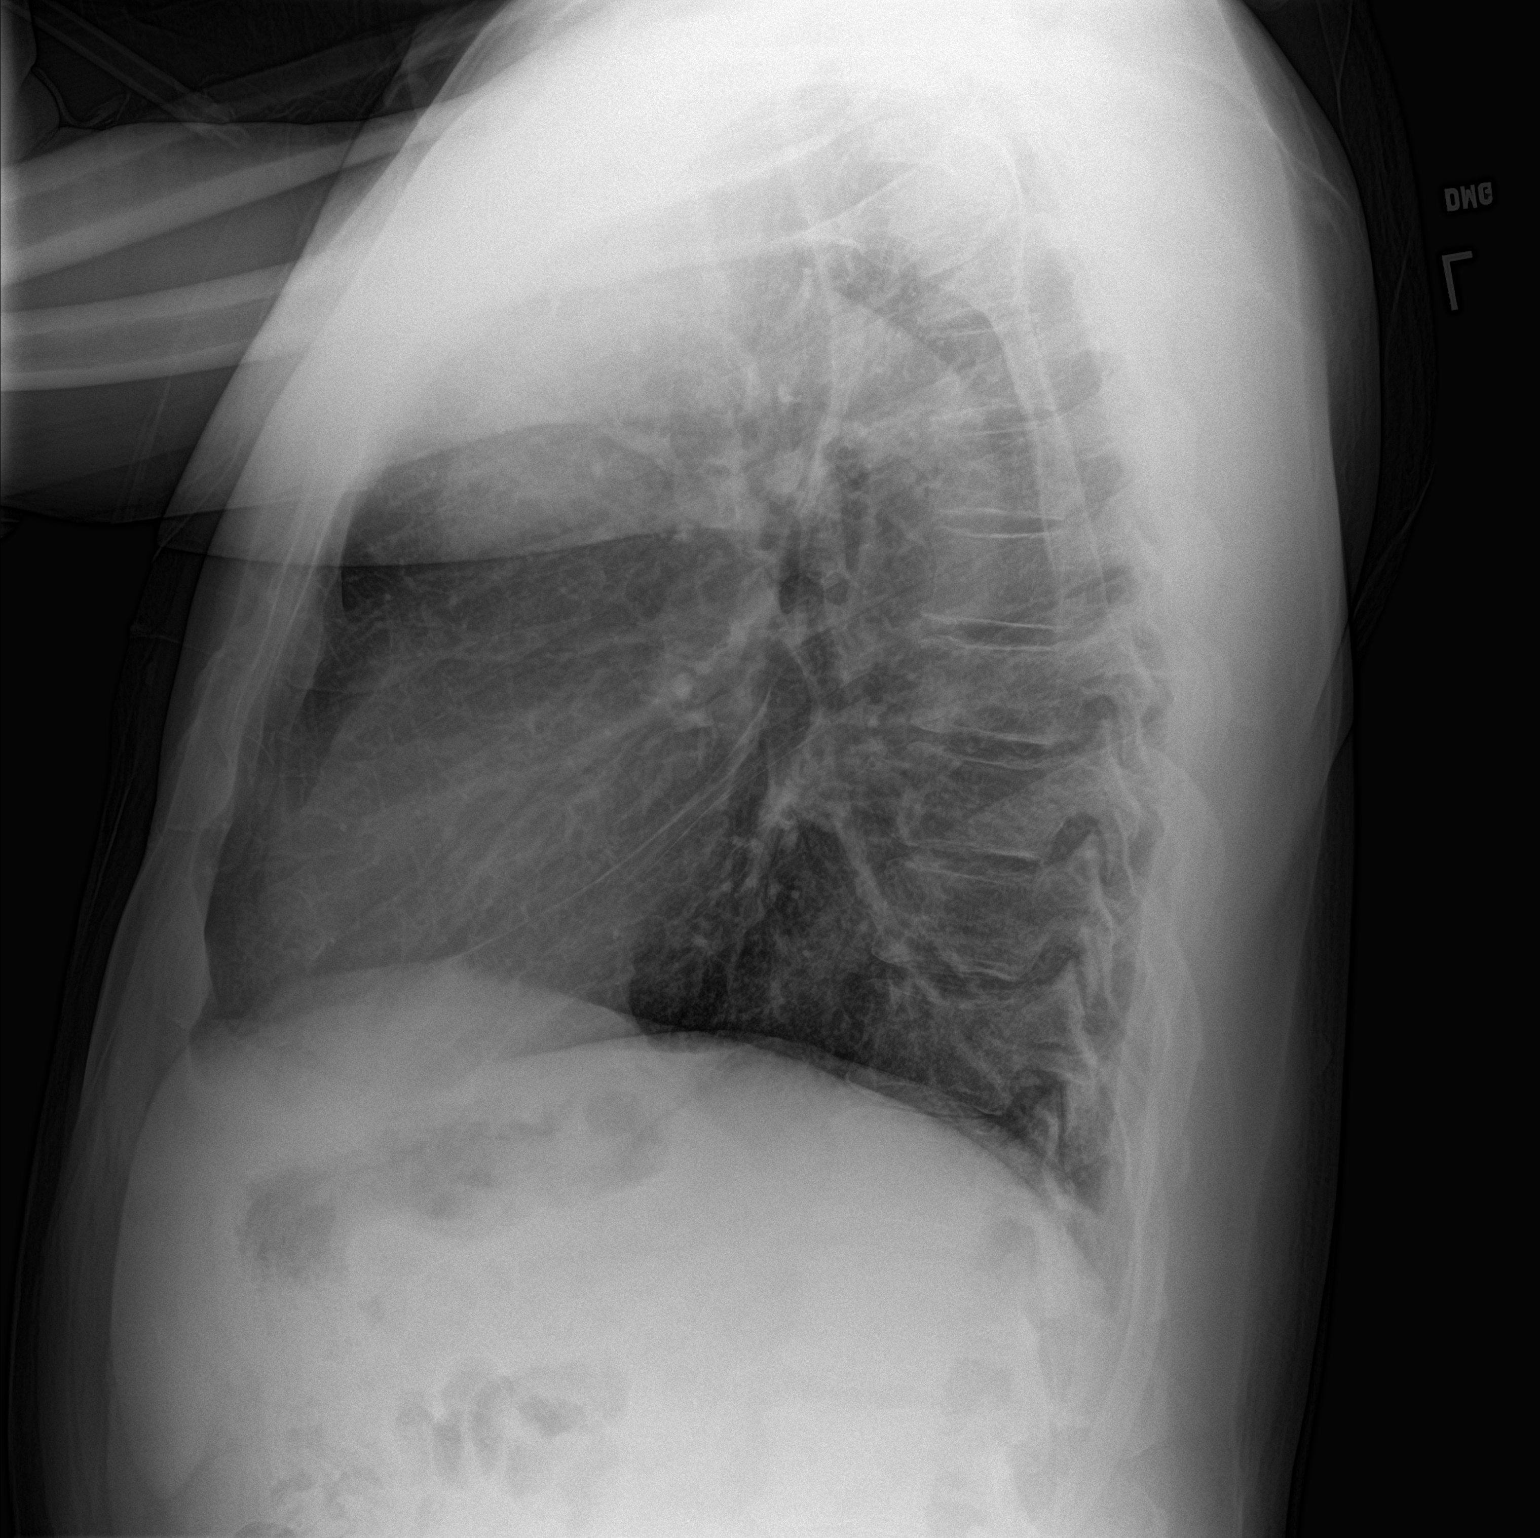

[2 of 2 positions shown; findings below may reference images not displayed]

FINDINGS: The lungs are well-expanded. The interstitial markings are coarse
though stable. There is no alveolar infiltrate or pleural effusion.
The heart and pulmonary vascularity are normal. The mediastinum is
normal in width. The trachea is midline. The bony thorax exhibits no
acute abnormality.
IMPRESSION: Mild chronic bronchitic changes, stable. There is no acute
cardiopulmonary abnormality.

## 2017-09-18 ENCOUNTER — Ambulatory Visit: Payer: Self-pay | Admitting: Orthopedic Surgery

## 2017-09-30 ENCOUNTER — Ambulatory Visit: Payer: Self-pay | Admitting: Orthopedic Surgery

## 2017-12-11 IMAGING — DX DG KNEE COMPLETE 4+V*L*
4 series · 4 of 4 positions shown · non-contrast
Comparison: None.

CLINICAL DATA: Left knee pain after fall.

EXAM:
LEFT KNEE - COMPLETE 4+ VIEW

[knee ap (1 of 3)]
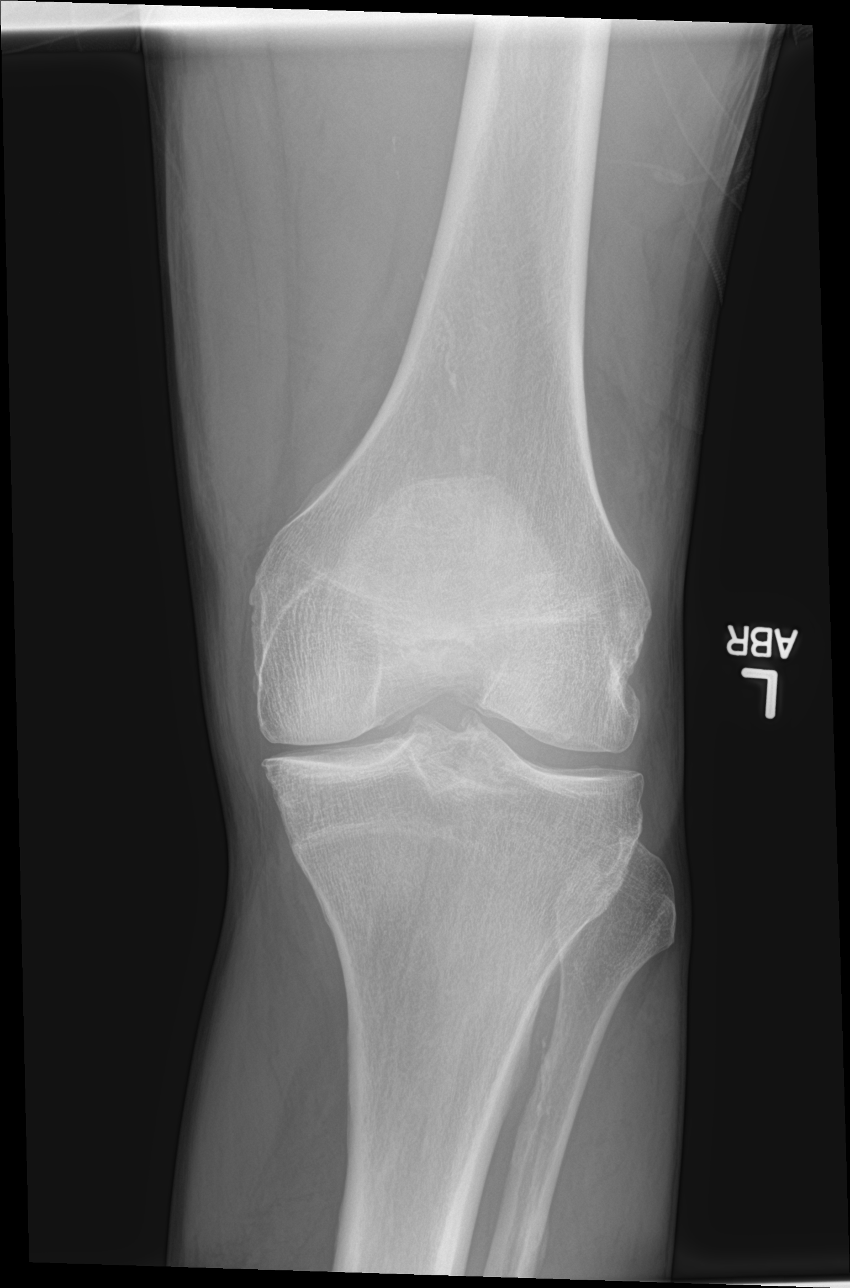

[knee lat]
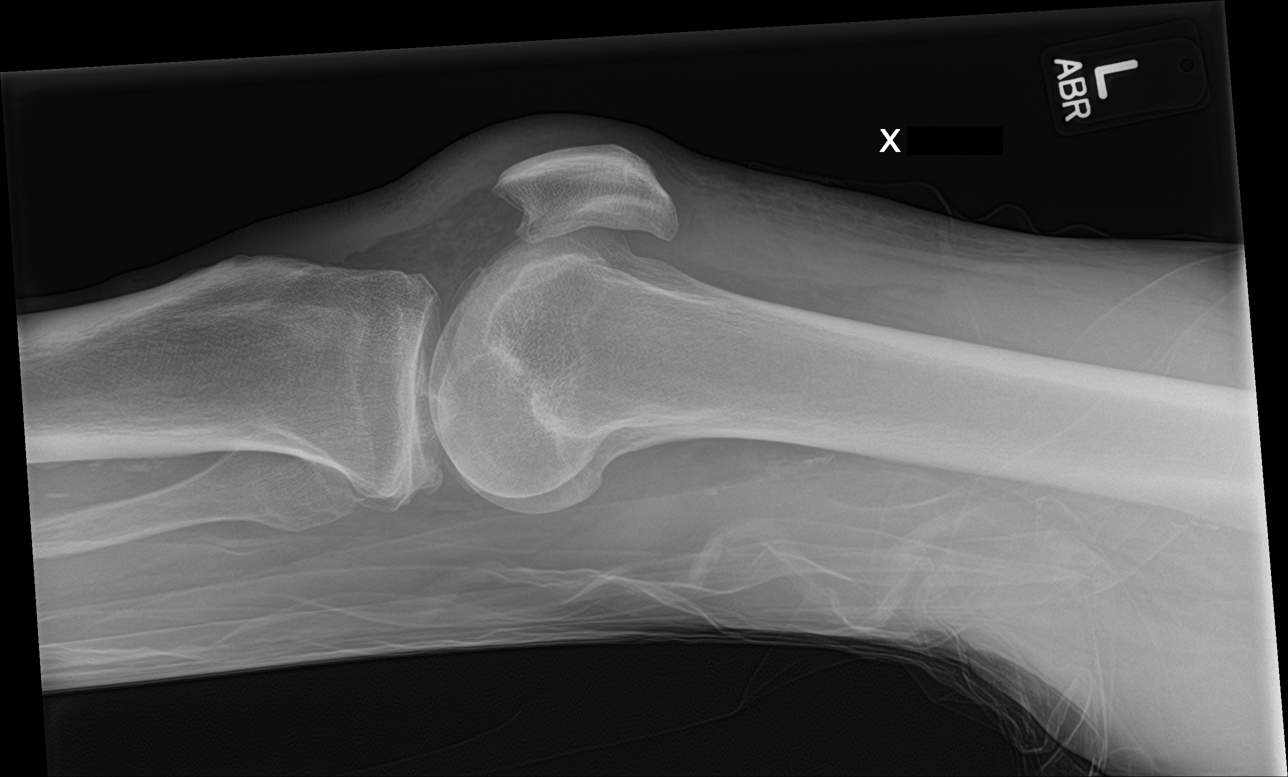

[knee ap (2 of 3)]
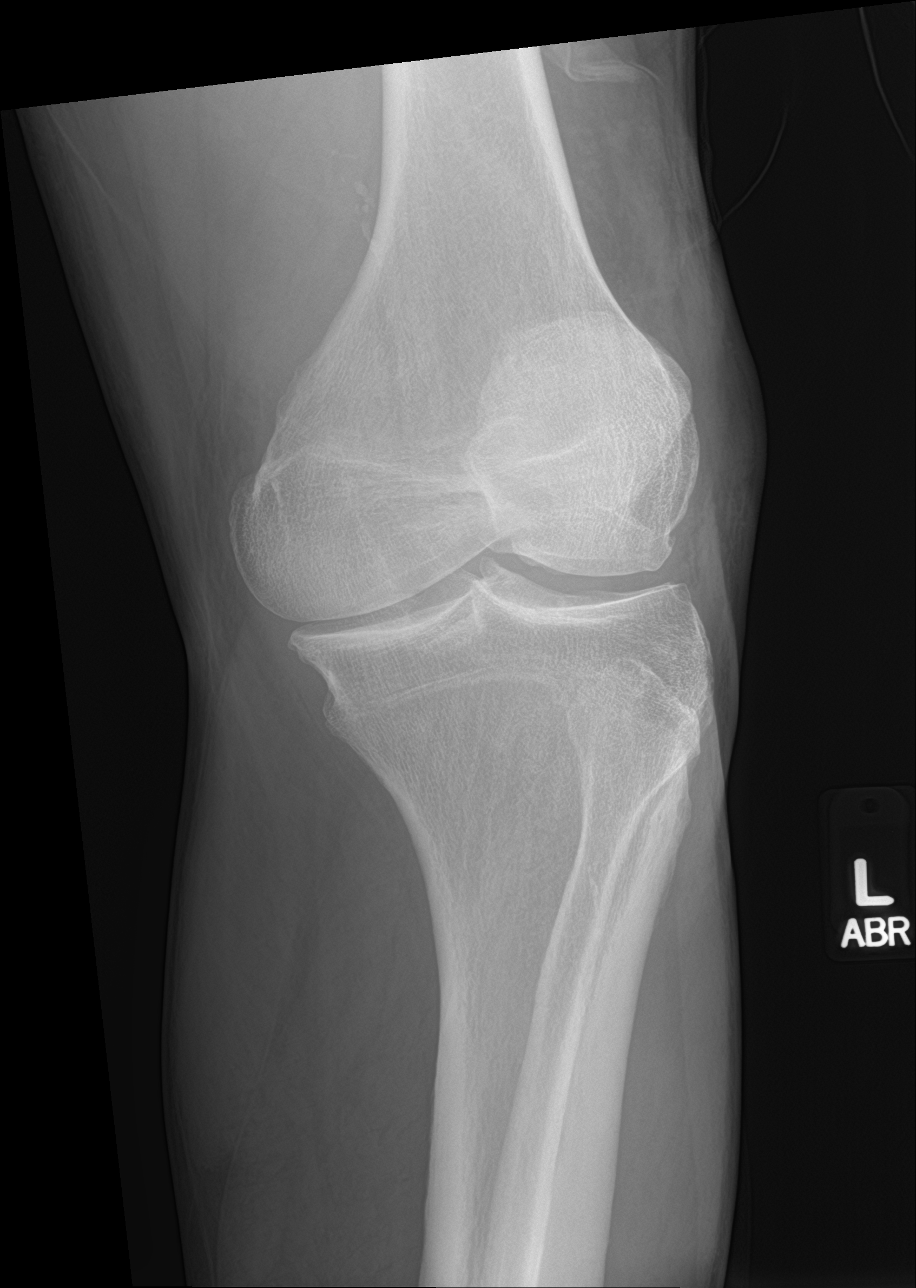

[knee ap (3 of 3)]
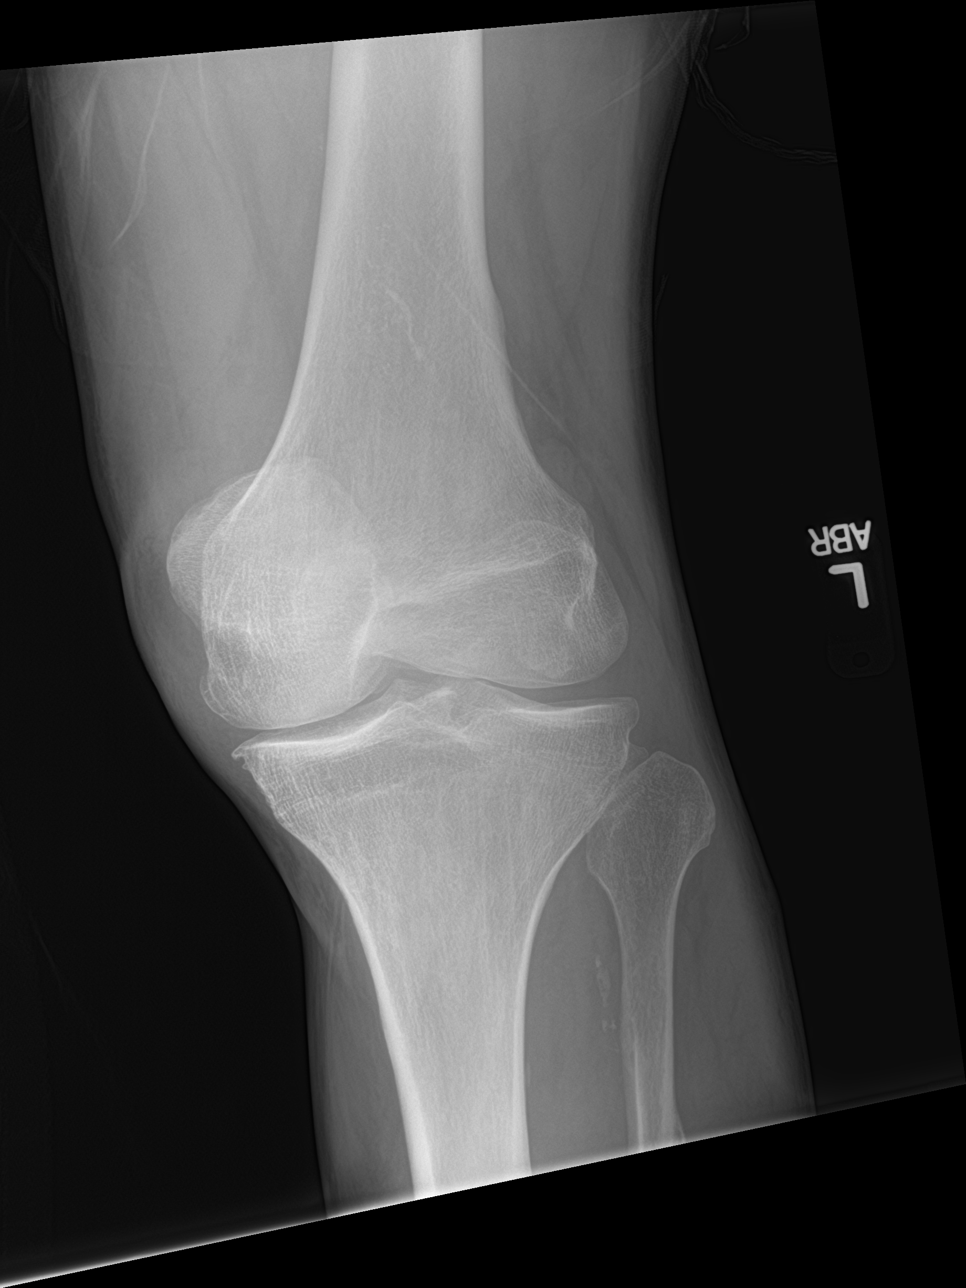

[4 of 4 positions shown; findings below may reference images not displayed]

FINDINGS: No acute fracture or malalignment. Moderate medial compartment joint
space narrowing with mild genu varus deformity. Small suprapatellar
joint effusion. Bone mineralization is normal. Atherosclerotic
vascular calcifications.
IMPRESSION: 1. Small suprapatellar joint effusion.  Insert dense
2. Moderate medial compartment degenerative changes.

## 2018-05-17 DIAGNOSIS — Z125 Encounter for screening for malignant neoplasm of prostate: Secondary | ICD-10-CM | POA: Diagnosis not present

## 2018-05-17 DIAGNOSIS — I10 Essential (primary) hypertension: Secondary | ICD-10-CM | POA: Diagnosis not present

## 2018-05-17 DIAGNOSIS — K625 Hemorrhage of anus and rectum: Secondary | ICD-10-CM | POA: Diagnosis not present

## 2018-05-24 DIAGNOSIS — E785 Hyperlipidemia, unspecified: Secondary | ICD-10-CM | POA: Diagnosis not present

## 2018-05-24 DIAGNOSIS — Z6827 Body mass index (BMI) 27.0-27.9, adult: Secondary | ICD-10-CM | POA: Diagnosis not present

## 2018-05-24 DIAGNOSIS — I10 Essential (primary) hypertension: Secondary | ICD-10-CM | POA: Diagnosis not present

## 2018-06-01 ENCOUNTER — Encounter: Payer: Self-pay | Admitting: Gastroenterology

## 2018-06-02 ENCOUNTER — Encounter: Payer: Self-pay | Admitting: Gastroenterology

## 2018-07-06 ENCOUNTER — Emergency Department (HOSPITAL_COMMUNITY)
Admission: EM | Admit: 2018-07-06 | Discharge: 2018-07-06 | Disposition: A | Discharge status: Home / Self Care | Payer: Medicare Other | Attending: Emergency Medicine | Admitting: Emergency Medicine

## 2018-07-06 ENCOUNTER — Other Ambulatory Visit: Payer: Self-pay

## 2018-07-06 ENCOUNTER — Encounter (HOSPITAL_COMMUNITY): Payer: Self-pay

## 2018-07-06 DIAGNOSIS — M5412 Radiculopathy, cervical region: Secondary | ICD-10-CM | POA: Insufficient documentation

## 2018-07-06 DIAGNOSIS — M5442 Lumbago with sciatica, left side: Secondary | ICD-10-CM | POA: Diagnosis not present

## 2018-07-06 DIAGNOSIS — M5489 Other dorsalgia: Secondary | ICD-10-CM | POA: Diagnosis present

## 2018-07-06 DIAGNOSIS — F17210 Nicotine dependence, cigarettes, uncomplicated: Secondary | ICD-10-CM | POA: Insufficient documentation

## 2018-07-06 DIAGNOSIS — G8929 Other chronic pain: Secondary | ICD-10-CM | POA: Diagnosis not present

## 2018-07-06 DIAGNOSIS — I10 Essential (primary) hypertension: Secondary | ICD-10-CM | POA: Insufficient documentation

## 2018-07-06 DIAGNOSIS — Z79899 Other long term (current) drug therapy: Secondary | ICD-10-CM | POA: Insufficient documentation

## 2018-07-06 MED ORDER — PREDNISONE 10 MG PO TABS: ORAL_TABLET | ORAL | 0 refills | Status: DC

## 2018-07-06 MED ORDER — HYDROMORPHONE HCL 1 MG/ML IJ SOLN
1.0000 mg | Freq: Once | INTRAMUSCULAR | Status: AC
  Administered 2018-07-06: 1 mg via INTRAMUSCULAR
  Filled 2018-07-06: qty 1

## 2018-07-06 MED ORDER — HYDROCODONE-ACETAMINOPHEN 5-325 MG PO TABS: 1.0000 | ORAL_TABLET | ORAL | 0 refills | Status: DC | PRN

## 2018-07-06 MED ORDER — DEXAMETHASONE SODIUM PHOSPHATE 10 MG/ML IJ SOLN
10.0000 mg | Freq: Once | INTRAMUSCULAR | Status: AC
  Administered 2018-07-06: 10 mg via INTRAMUSCULAR
  Filled 2018-07-06: qty 1

## 2018-07-06 NOTE — ED Notes (Signed)
EDP at bedside  

## 2018-07-06 NOTE — ED Triage Notes (Signed)
Pt reports pinched nerve in lower back.  C/O pain in lower back radiating down left leg x 1 week.  Pt also says arms and feet are numb.  Denies recent injury.

## 2018-07-06 NOTE — Discharge Instructions (Signed)
Take your next dose of prednisone tomorrow morning.  Use the the other medicines as directed.  Do not drive within 4 hours of taking oxycodone as this will make you drowsy.  Avoid lifting,  Bending,  Twisting or any other activity that worsens your pain over the next week.  .  You should get rechecked if your symptoms are not better over the next week,  Or you develop increased pain,  Weakness in your leg(s) or loss of bladder or bowel function - these are symptoms of a worsening condition.

## 2018-07-06 NOTE — ED Provider Notes (Signed)
[facility] EMERGENCY DEPARTMENT Provider Note   CSN: [id number] Arrival date & time: 07/06/18  2122    History   Chief Complaint Chief Complaint  Patient presents with  . Back Pain    HPI Stephen Koch is a 58 y.o. male with a history significant for arthritis, chronic back pain, HTN, GERD and left quadricept tendon rupture/repair last year presenting with acute on chronic neck and low back pain. He reports daily pain which is tolerable but has had escalation the past week.   Patient denies any new injury specifically.  There is  radiation of pain and numbness into his left leg to his foot and he also reports numbness in his bilateral upper arms which is a chronic intermittent symptom with these flares.  There has been no weakness in his extremities and no urinary or bowel retention or incontinence.  Patient does not have a history of cancer or IVDU.  The patient has tried ice, rest, stretching and flexeril without significant relief of symptoms. .     The history is provided by the patient.    Past Medical History:  Diagnosis Date  . Arthritis   . Chronic back pain    Forklift injury  . Essential hypertension, benign   . GERD (gastroesophageal reflux disease)   . History of medication noncompliance     Patient Active Problem List   Diagnosis Date Noted  . Rupture of left quadriceps tendon s/p repair 03/05/17   . Helicobacter pylori gastritis 04/18/2015  . ETOH abuse 04/18/2015  . Liver lesion 04/18/2015  . Essential hypertension, benign 11/04/2013  . Heart murmur 11/04/2013  . Back pain associated with peripheral numbness 08/10/2013  . Upper abdominal pain 03/31/2013  . Hematochezia 07/01/2012    Past Surgical History:  Procedure Laterality Date  . COLONOSCOPY N/A 07/06/2012   QMG:NOIBBC mucosa in the terminal ileum/Moderate sized internal hemorrhoids  . ESOPHAGOGASTRODUODENOSCOPY N/A 04/08/2013   WUG:QBVQX gastric ulcer/duodenal inflammation. bx with chronic  active gastritis with focal intestinal metaplasia, ulceration and H pylori  . Left knee surgery Left    arthroscopy  . QUADRICEPS TENDON REPAIR Left 03/05/2017   Procedure: REPAIR QUADRICEP TENDON;  Surgeon: Carole Civil, MD;  Location: [facility];  Service: Orthopedics;  Laterality: Left;  . Right arm surgery     tendon repair        Home Medications    Prior to Admission medications   Medication Sig Start Date End Date Taking? Authorizing Provider  amLODipine (NORVASC) 5 MG tablet  05/06/17   [provider]  HYDROcodone-acetaminophen (NORCO/VICODIN) 5-325 MG tablet Take 1 tablet by mouth every 4 (four) hours as needed. 07/06/18   Evalee Jefferson, PA-C  Magnesium Salicylate (DOANS PILLS PO) Take 1 tablet daily by mouth.    [provider]  Menthol, Topical Analgesic, (BENGAY EX) Apply 1 application at bedtime as needed topically (pain).    [provider]  methocarbamol (ROBAXIN) 500 MG tablet  05/06/17   [provider]  naproxen (NAPROSYN) 500 MG tablet  03/04/17   [provider]  predniSONE (DELTASONE) 10 MG tablet Take 6 tablets day one, 5 tablets day two, 4 tablets day three, 3 tablets day four, 2 tablets day five, then 1 tablet day six 07/06/18   Martie Muhlbauer, Almyra Free, PA-C  sildenafil (VIAGRA) 100 MG tablet Take 100 mg by mouth daily as needed for erectile dysfunction.    [provider]    Family History Family History  Problem Relation Age  of Onset  . Heart disease Father     Social History Social History   Tobacco Use  . Smoking status: Current Every Day Smoker    Packs/day: 0.25    Years: 40.00    Pack years: 10.00    Types: Cigarettes  . Smokeless tobacco: Never Used  . Tobacco comment: Smokes about 4 cigarettes daily  Substance Use Topics  . Alcohol use: Yes    Alcohol/week: 0.0 standard drinks    Comment: occ  . Drug use: Yes    Types: Marijuana    Comment: Marijuana twice per week     Allergies    Ibuprofen   Review of Systems Review of Systems  Constitutional: Negative for fever.  Respiratory: Negative for shortness of breath.   Cardiovascular: Negative for chest pain and leg swelling.  Gastrointestinal: Negative for abdominal distention, abdominal pain and constipation.  Genitourinary: Negative for difficulty urinating, dysuria, flank pain, frequency and urgency.  Musculoskeletal: Positive for back pain. Negative for gait problem and joint swelling.  Skin: Negative for rash.  Neurological: Positive for numbness. Negative for weakness.     Physical Exam Updated Vital Signs BP (!) 179/81 (BP Location: Right Arm)   Pulse (!) 59   Temp 98.3 F (36.8 C) (Oral)   Resp 18   Ht 6\' 3"  (1.905 m)   Wt 97.5 kg   SpO2 96%   BMI 26.87 kg/m   Physical Exam Vitals signs and nursing note reviewed.  Constitutional:      Appearance: He is well-developed.  HENT:     Head: Normocephalic.  Eyes:     Conjunctiva/sclera: Conjunctivae normal.  Neck:     Musculoskeletal: Normal range of motion and neck supple.  Cardiovascular:     Rate and Rhythm: Normal rate.  Pulmonary:     Effort: Pulmonary effort is normal.  Abdominal:     Palpations: Abdomen is soft. There is no mass.  Musculoskeletal: Normal range of motion.     Cervical back: He exhibits bony tenderness. He exhibits no swelling, no edema and no spasm.     Lumbar back: He exhibits tenderness. He exhibits no swelling, no edema and no spasm.  Skin:    General: Skin is warm and dry.  Neurological:     Mental Status: He is alert.     Sensory: No sensory deficit.     Motor: No weakness, tremor or atrophy.     Gait: Gait normal.     Deep Tendon Reflexes: Babinski sign absent on the right side. Babinski sign absent on the left side.     Reflex Scores:      Bicep reflexes are 2+ on the right side and 2+ on the left side.      Patellar reflexes are 2+ on the right side and 1+ on the left side.    Comments: No strength deficit  noted in hip and knee flexor and extensor muscle groups.  Ankle flexion and extension intact. Equal grip strength.      ED Treatments / Results  Labs (all labs ordered are listed, but only abnormal results are displayed) Labs Reviewed - No data to display  EKG None  Radiology No results found.  Procedures Procedures (including critical care time)  Medications Ordered in ED Medications  HYDROmorphone (DILAUDID) injection 1 mg (1 mg Intramuscular Given 07/06/18 0831)  dexamethasone (DECADRON) injection 10 mg (10 mg Intramuscular Given 07/06/18 0831)     Initial Impression / Assessment and Plan / ED Course  I have reviewed the triage vital signs and the nursing notes.  Pertinent labs & imaging results that were available during my care of the patient were reviewed by me and considered in my medical decision making (see chart for details).        No neuro deficit on exam or by history to suggest emergent or surgical presentation.  discussed worsened sx that should prompt immediate re-evaluation including distal weakness, bowel/bladder retention/incontinence.          Final Clinical Impressions(s) / ED Diagnoses   Final diagnoses:  Chronic left-sided low back pain with left-sided sciatica  Cervical radiculopathy    ED Discharge Orders         Ordered    predniSONE (DELTASONE) 10 MG tablet     07/06/18 0856    HYDROcodone-acetaminophen (NORCO/VICODIN) 5-325 MG tablet  Every 4 hours PRN     07/06/18 0856           Evalee Jefferson, PA-C 07/06/18 3567    Veryl Speak, MD 07/06/18 [phone]

## 2018-08-10 ENCOUNTER — Ambulatory Visit: Payer: Medicare Other | Admitting: Gastroenterology

## 2018-10-20 ENCOUNTER — Other Ambulatory Visit: Payer: Self-pay

## 2018-10-20 ENCOUNTER — Telehealth: Payer: Self-pay

## 2018-10-20 ENCOUNTER — Telehealth: Payer: Self-pay | Admitting: Gastroenterology

## 2018-10-20 ENCOUNTER — Ambulatory Visit (INDEPENDENT_AMBULATORY_CARE_PROVIDER_SITE_OTHER): Payer: Medicare Other | Admitting: Gastroenterology

## 2018-10-20 ENCOUNTER — Encounter: Payer: Self-pay | Admitting: Gastroenterology

## 2018-10-20 VITALS — BP 183/95 | HR 82 | Temp 98.0°F | Ht 75.0 in | Wt 203.0 lb

## 2018-10-20 DIAGNOSIS — K2970 Gastritis, unspecified, without bleeding: Secondary | ICD-10-CM

## 2018-10-20 DIAGNOSIS — B9681 Helicobacter pylori [H. pylori] as the cause of diseases classified elsewhere: Secondary | ICD-10-CM | POA: Diagnosis not present

## 2018-10-20 DIAGNOSIS — K625 Hemorrhage of anus and rectum: Secondary | ICD-10-CM

## 2018-10-20 DIAGNOSIS — K5900 Constipation, unspecified: Secondary | ICD-10-CM

## 2018-10-20 MED ORDER — HYDROCORTISONE (PERIANAL) 2.5 % EX CREA: 1.0000 "application " | TOPICAL_CREAM | Freq: Two times a day (BID) | CUTANEOUS | 1 refills | Status: DC

## 2018-10-20 MED ORDER — AMLODIPINE BESYLATE 5 MG PO TABS: 5.0000 mg | ORAL_TABLET | Freq: Every day | ORAL | 1 refills | Status: DC

## 2018-10-20 MED ORDER — PEG 3350-KCL-NA BICARB-NACL 420 G PO SOLR: 4000.0000 mL | ORAL | 0 refills | Status: DC

## 2018-10-20 NOTE — Telephone Encounter (Signed)
TCS/hem banding w/Propofol w/SLF scheduled for 01/04/19 at 12:00pm this am during OV. Pre-op appt 12/31/18 at 1:45pm. Pre-op appt letter mailed with procedure instructions.

## 2018-10-20 NOTE — Progress Notes (Addendum)
REVIEWED. EGD FOR PUD FOLLOW UP AND TCS FOR BRBPR.  Primary Care Physician:  Iona Beard, MD Primary Gastroenterologist:  Dr. Oneida Alar   Chief Complaint  Patient presents with  . Blood In Stools    Has blood when straining to have bm    HPI:   Stephen Koch is a 58 y.o. male presenting today at the request of Dr. Berdine Addison due to rectal bleeding. History of H.pylori with small gastric ulcer in 2014 s/p treatment. Needs eradication documented. Colonoscopy in 2014 with moderate sized internal hemorrhoids. Here today due to rectal bleeding.   Intermittent rectal bleeding with straining. BM every other day. No OTC agents to help with constipation. Will have to sit on toilet for awhile then come back. No fiber. Intermittent rectal discomfort with straining. Will feel prolapsing but usually resolves on own. Sometimes will have to push it back. Sometimes abdominal discomfort but improved after BM. Good appetite. Has lost 10 lbs over the past 6 months stating decreased oral intake as he doesn't want to go in stores to get groceries due to COVID-19. Occasional reflux but no dysphagia. No N/V. No NSAIDs.    Has been out of Norvasc for 2 weeks. Out of pain medication and would take three times a day. Has not made appt with PCP and has not been due to [place].   Past Medical History:  Diagnosis Date  . Arthritis   . Chronic back pain    Forklift injury  . Essential hypertension, benign   . GERD (gastroesophageal reflux disease)   . History of medication noncompliance     Past Surgical History:  Procedure Laterality Date  . COLONOSCOPY N/A 07/06/2012   NTI:[id number] mucosa in the terminal ileum/Moderate sized internal hemorrhoids  . ESOPHAGOGASTRODUODENOSCOPY N/A 04/08/2013   QMG:QQPYP gastric ulcer/duodenal inflammation. 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 Left knee surgery Left    arthroscopy  . QUADRICEPS TENDON REPAIR Left 03/05/2017   Procedure: REPAIR QUADRICEP TENDON;  Surgeon: Carole Civil, MD;  Location: [facility];  Service: Orthopedics;  Laterality: Left;  . Right arm surgery     tendon repair    Current Outpatient Medications  Medication Sig Dispense Refill  . amLODipine (NORVASC) 5 MG tablet Take 1 tablet (5 mg total) by mouth daily. 30 tablet 1  . HYDROcodone-acetaminophen (NORCO/VICODIN) 5-325 MG tablet Take 1 tablet by mouth every 4 (four) hours as needed. 20 tablet 0  . Magnesium Salicylate (DOANS PILLS PO) Take 1 tablet daily by mouth.    . Menthol, Topical Analgesic, (BENGAY EX) Apply 1 application at bedtime as needed topically (pain).     No current facility-administered medications for this visit.     Allergies as of 10/20/2018 - Review Complete 10/20/2018  Allergen Reaction Noted  . Ibuprofen Other (See Comments) 04/06/2015    Family History  Problem Relation Age of Onset  . Heart disease Father   . Colon cancer Neg Hx   . Colon polyps Neg Hx     Social History   Socioeconomic History  . Marital status: Married    Spouse name: Not on file  . Number of children: 4  . Years of education: Not on file  . Highest education level: Not on file  Occupational History  . Occupation: Unemployed; previously Proofreader work  Scientific laboratory technician  . Financial resource strain: Not on file  . Food insecurity    Worry: Not on file  Inability: Not on file  . Transportation needs    Medical: Not on file    Non-medical: Not on file  Tobacco Use  . Smoking status: Current Every Day Smoker    Packs/day: 0.25    Years: 40.00    Pack years: 10.00    Types: Cigarettes  . Smokeless tobacco: Never Used  . Tobacco comment: smokes about 6-7 per day   Substance and Sexual Activity  . Alcohol use: Yes    Alcohol/week: 0.0 standard drinks    Comment: beer 4-5 per day.   . Drug use: Yes    Types: Marijuana    Comment: Marijuana 3 times per week   . Sexual activity: Yes    Birth control/protection: None   Lifestyle  . Physical activity    Days per week: Not on file    Minutes per session: Not on file  . Stress: Not on file  Relationships  . Social Herbalist on phone: Not on file    Gets together: Not on file    Attends religious service: Not on file    Active member of club or organization: Not on file    Attends meetings of clubs or organizations: Not on file    Relationship status: Not on file  . Intimate partner violence    Fear of current or ex partner: Not on file    Emotionally abused: Not on file    Physically abused: Not on file    Forced sexual activity: Not on file  Other Topics Concern  . Not on file  Social History Narrative   Lives w/ grandma    Review of Systems: Gen: Denies any fever, chills, fatigue, weight loss, lack of appetite.  CV: Denies chest pain, heart palpitations, peripheral edema, syncope.  Resp: Denies shortness of breath at rest or with exertion. Denies wheezing or cough.  GI: see HPI  GU : Denies urinary burning, urinary frequency, urinary hesitancy MS: Denies joint pain, muscle weakness, cramps, or limitation of movement.  Derm: Denies rash, itching, dry skin Psych: Denies depression, anxiety, memory loss, and confusion Heme: see HPI  Physical Exam: BP (!) 183/95   Pulse 82   Temp 98 F (36.7 C)   Ht _0  (1.905 m)   Wt 203 lb (92.1 kg)   BMI 25.37 kg/m  General:   Alert and oriented. Pleasant and cooperative. Well-nourished and well-developed.  Head:  Normocephalic and atraumatic. Eyes:  Without icterus, sclera clear and conjunctiva pink.  Ears:  Normal auditory acuity. Nose:  No deformity, discharge,  or lesions. Mouth:  No deformity or lesions, oral mucosa pink.  Lungs:  Clear to auscultation bilaterally. No wheezes, rales, or rhonchi. No distress.  Heart:  S1, S2 present with systolic murmur  Abdomen:  +BS, soft, non-tender and non-distended. No HSM noted. No guarding or rebound. No masses appreciated.  Rectal:   Deferred  Msk:  Symmetrical without gross deformities. Normal posture Extremities:  Without edema. Neurologic:  Alert and  oriented x4 Psych:  Alert and cooperative. Normal mood and affect.  Outside labs Jan 2020: Creatinine 0.94, BUN 9, Tbili 0.5, Alk Phos 77, AST 23, ALT 20, Hgb 15.7, Hct 44.6, Platelet 275

## 2018-10-20 NOTE — Patient Instructions (Signed)
Please complete the breath test at the lab when you are able.   For constipation: start taking Benefiber 1 to 3 teaspoons daily. If needed, you can take 1 capful of Miralax with 8 ounces of water on any day you don't have a bowel movement. You may need a prescription medication for constipation if you find that this is not helpful OR you are taking Miralax frequently.  I sent a rectal cream to your pharmacy to use twice a day for 7 days.   We have arranged a colonoscopy with Dr. Oneida Alar in the future with hemorrhoid banding.  We will see you in 4 months!  Please keep me updated over the next few weeks with how your constipation and rectal discomfort is going. I have also sent in a limited supply of Norvasc (your blood pressure medication) until you can see Dr. Berdine Addison. It is very important to see him soon.  It was a pleasure to see you today. I strive to create trusting relationships with patients to provide genuine, compassionate, and quality care. I value your feedback. If you receive a survey regarding your visit,  I greatly appreciate you taking time to fill this out.   Annitta Needs, PhD, ANP-BC [facility]

## 2018-10-20 NOTE — Assessment & Plan Note (Addendum)
With small ulcer in 2014, s/p treatment. Need to document eradication. Currently not on a PPI and asymptomatic from UGI standpoint. Urea breath test ordered. Discussing with Dr. Oneida Alar need for surveillance EGD.   Addendum: Proceed with upper endoscopy in the near future with Dr. Oneida Alar. The risks, benefits, and alternatives have been discussed in detail with patient. They have stated understanding and desire to proceed.

## 2018-10-20 NOTE — Progress Notes (Signed)
cc'ed to pcp °

## 2018-10-20 NOTE — Telephone Encounter (Signed)
Please let patient know he DOES NOT need to do the breath test.  However, we are recommending an upper endoscopy at time of colonoscopy for surveillance of PUD. I discussed this possibility with him at his office visit.   [facility] clinical pool: please add EGD at time of colonoscopy with Dr. Oneida Alar, reason: PUD surveillance.

## 2018-10-20 NOTE — Telephone Encounter (Signed)
Pt is aware.  Forwarding to [facility].

## 2018-10-20 NOTE — Assessment & Plan Note (Signed)
58 year old male with intermittent rectal bleeding, known internal hemorrhoids in 2014, with likely benign anorectal source in setting of constipation. As it has been 6 years, will pursue colonoscopy with possible banding in near future.  Proceed with colonoscopy with Dr. Oneida Alar in the near future. The risks, benefits, and alternatives have been discussed in detail with the patient. They state understanding and desire to proceed.  Propofol due to alcohol use Possible banding at time of colonoscopy Anusol cream BID Start supplemental fiber, call if needs more aggressive regimen 4 month follow-up

## 2018-10-20 NOTE — Assessment & Plan Note (Signed)
Start supplemental fiber. May need Linzess or Amitiza.

## 2018-10-21 ENCOUNTER — Other Ambulatory Visit: Payer: Self-pay | Admitting: *Deleted

## 2018-10-21 DIAGNOSIS — K625 Hemorrhage of anus and rectum: Secondary | ICD-10-CM

## 2018-10-21 DIAGNOSIS — K279 Peptic ulcer, site unspecified, unspecified as acute or chronic, without hemorrhage or perforation: Secondary | ICD-10-CM

## 2018-10-21 NOTE — Telephone Encounter (Signed)
Per carolyn in endo okay to add egd on procedure scheduled for 9/8. New orders entered.

## 2018-12-31 ENCOUNTER — Other Ambulatory Visit (HOSPITAL_COMMUNITY)
Admission: RE | Admit: 2018-12-31 | Discharge: 2018-12-31 | Disposition: A | Discharge status: Home / Self Care | Payer: Medicare Other | Source: Ambulatory Visit | Attending: Gastroenterology | Admitting: Gastroenterology

## 2018-12-31 ENCOUNTER — Other Ambulatory Visit: Payer: Self-pay

## 2018-12-31 ENCOUNTER — Other Ambulatory Visit (HOSPITAL_COMMUNITY): Payer: Medicare Other

## 2018-12-31 ENCOUNTER — Encounter (HOSPITAL_COMMUNITY)
Admission: RE | Admit: 2018-12-31 | Discharge: 2018-12-31 | Disposition: A | Discharge status: Home / Self Care | Payer: Medicare Other | Source: Ambulatory Visit | Attending: Gastroenterology | Admitting: Gastroenterology

## 2018-12-31 DIAGNOSIS — Z01812 Encounter for preprocedural laboratory examination: Secondary | ICD-10-CM | POA: Insufficient documentation

## 2018-12-31 DIAGNOSIS — Z20828 Contact with and (suspected) exposure to other viral communicable diseases: Secondary | ICD-10-CM | POA: Diagnosis not present

## 2018-12-31 LAB — SARS CORONAVIRUS 2 (TAT 6-24 HRS): SARS Coronavirus 2: NEGATIVE

## 2019-01-04 ENCOUNTER — Other Ambulatory Visit: Payer: Self-pay

## 2019-01-04 ENCOUNTER — Encounter (HOSPITAL_COMMUNITY)
Admission: RE | Disposition: A | Discharge status: Home / Self Care | Payer: Self-pay | Source: Home / Self Care | Attending: Gastroenterology

## 2019-01-04 ENCOUNTER — Ambulatory Visit (HOSPITAL_COMMUNITY): Admit: 2019-01-04 | Payer: Medicare Other | Admitting: Gastroenterology

## 2019-01-04 ENCOUNTER — Encounter (HOSPITAL_COMMUNITY): Payer: Self-pay | Admitting: *Deleted

## 2019-01-04 ENCOUNTER — Ambulatory Visit (HOSPITAL_COMMUNITY)
Admission: RE | Admit: 2019-01-04 | Discharge: 2019-01-04 | Disposition: A | Discharge status: Home / Self Care | Payer: Medicare Other | Attending: Gastroenterology | Admitting: Gastroenterology

## 2019-01-04 ENCOUNTER — Encounter (HOSPITAL_COMMUNITY): Payer: Self-pay

## 2019-01-04 ENCOUNTER — Ambulatory Visit (HOSPITAL_COMMUNITY): Payer: Medicare Other | Admitting: Anesthesiology

## 2019-01-04 DIAGNOSIS — Q439 Congenital malformation of intestine, unspecified: Secondary | ICD-10-CM | POA: Diagnosis not present

## 2019-01-04 DIAGNOSIS — K921 Melena: Secondary | ICD-10-CM | POA: Insufficient documentation

## 2019-01-04 DIAGNOSIS — F17210 Nicotine dependence, cigarettes, uncomplicated: Secondary | ICD-10-CM | POA: Insufficient documentation

## 2019-01-04 DIAGNOSIS — K449 Diaphragmatic hernia without obstruction or gangrene: Secondary | ICD-10-CM | POA: Diagnosis not present

## 2019-01-04 DIAGNOSIS — Z09 Encounter for follow-up examination after completed treatment for conditions other than malignant neoplasm: Secondary | ICD-10-CM | POA: Diagnosis not present

## 2019-01-04 DIAGNOSIS — K642 Third degree hemorrhoids: Secondary | ICD-10-CM

## 2019-01-04 DIAGNOSIS — Z8711 Personal history of peptic ulcer disease: Secondary | ICD-10-CM | POA: Insufficient documentation

## 2019-01-04 DIAGNOSIS — K2950 Unspecified chronic gastritis without bleeding: Secondary | ICD-10-CM | POA: Diagnosis not present

## 2019-01-04 DIAGNOSIS — K2970 Gastritis, unspecified, without bleeding: Secondary | ICD-10-CM

## 2019-01-04 DIAGNOSIS — I10 Essential (primary) hypertension: Secondary | ICD-10-CM | POA: Insufficient documentation

## 2019-01-04 DIAGNOSIS — K279 Peptic ulcer, site unspecified, unspecified as acute or chronic, without hemorrhage or perforation: Secondary | ICD-10-CM

## 2019-01-04 DIAGNOSIS — K625 Hemorrhage of anus and rectum: Secondary | ICD-10-CM

## 2019-01-04 HISTORY — PX: BIOPSY: SHX5522

## 2019-01-04 HISTORY — PX: HEMORRHOID BANDING: SHX5850

## 2019-01-04 HISTORY — PX: ESOPHAGOGASTRODUODENOSCOPY (EGD) WITH PROPOFOL: SHX5813

## 2019-01-04 HISTORY — PX: COLONOSCOPY WITH PROPOFOL: SHX5780

## 2019-01-04 SURGERY — COLONOSCOPY WITH PROPOFOL
Anesthesia: Monitor Anesthesia Care

## 2019-01-04 SURGERY — COLONOSCOPY WITH PROPOFOL
Anesthesia: General

## 2019-01-04 MED ORDER — PROPOFOL 10 MG/ML IV BOLUS
INTRAVENOUS | Status: AC
  Filled 2019-01-04: qty 40

## 2019-01-04 MED ORDER — PROMETHAZINE HCL 25 MG/ML IJ SOLN: 6.2500 mg | INTRAMUSCULAR | Status: DC | PRN

## 2019-01-04 MED ORDER — KETAMINE HCL 10 MG/ML IJ SOLN
INTRAMUSCULAR | Status: DC | PRN
  Administered 2019-01-04 (×2): 10 mg via INTRAVENOUS

## 2019-01-04 MED ORDER — KETAMINE HCL 50 MG/5ML IJ SOSY
PREFILLED_SYRINGE | INTRAMUSCULAR | Status: AC
  Filled 2019-01-04: qty 5

## 2019-01-04 MED ORDER — MIDAZOLAM HCL 2 MG/2ML IJ SOLN: 0.5000 mg | Freq: Once | INTRAMUSCULAR | Status: DC | PRN

## 2019-01-04 MED ORDER — LACTATED RINGERS IV SOLN
INTRAVENOUS | Status: DC
  Administered 2019-01-04: 12:00:00 via INTRAVENOUS

## 2019-01-04 MED ORDER — CHLORHEXIDINE GLUCONATE CLOTH 2 % EX PADS: 6.0000 | MEDICATED_PAD | Freq: Once | CUTANEOUS | Status: DC

## 2019-01-04 MED ORDER — GLYCOPYRROLATE 0.2 MG/ML IJ SOLN
INTRAMUSCULAR | Status: DC | PRN
  Administered 2019-01-04: 0.2 mg via INTRAVENOUS

## 2019-01-04 MED ORDER — PROPOFOL 10 MG/ML IV BOLUS
INTRAVENOUS | Status: DC | PRN
  Administered 2019-01-04 (×3): 20 mg via INTRAVENOUS

## 2019-01-04 MED ORDER — HYDROCODONE-ACETAMINOPHEN 7.5-325 MG PO TABS: 1.0000 | ORAL_TABLET | Freq: Once | ORAL | Status: DC | PRN

## 2019-01-04 MED ORDER — HYDROMORPHONE HCL 1 MG/ML IJ SOLN: 0.2500 mg | INTRAMUSCULAR | Status: DC | PRN

## 2019-01-04 MED ORDER — MIDAZOLAM HCL 5 MG/5ML IJ SOLN
INTRAMUSCULAR | Status: DC | PRN
  Administered 2019-01-04: 1 mg via INTRAVENOUS

## 2019-01-04 MED ORDER — EPHEDRINE SULFATE 50 MG/ML IJ SOLN
INTRAMUSCULAR | Status: DC | PRN
  Administered 2019-01-04: 5 mg via INTRAVENOUS
  Administered 2019-01-04: 10 mg via INTRAVENOUS

## 2019-01-04 MED ORDER — OMEPRAZOLE 20 MG PO CPDR: DELAYED_RELEASE_CAPSULE | ORAL | 3 refills | Status: DC

## 2019-01-04 MED ORDER — LIDOCAINE HCL 1 % IJ SOLN
INTRAMUSCULAR | Status: DC | PRN
  Administered 2019-01-04: 50 mg via INTRADERMAL

## 2019-01-04 MED ORDER — PROPOFOL 500 MG/50ML IV EMUL
INTRAVENOUS | Status: DC | PRN
  Administered 2019-01-04: 150 ug/kg/min via INTRAVENOUS

## 2019-01-04 MED ORDER — MIDAZOLAM HCL 2 MG/2ML IJ SOLN
INTRAMUSCULAR | Status: AC
  Filled 2019-01-04: qty 2

## 2019-01-04 NOTE — Anesthesia Postprocedure Evaluation (Signed)
Anesthesia Post Note  Patient: Stephen Koch  Procedure(s) Performed: COLONOSCOPY WITH PROPOFOL (N/A ) HEMORRHOID BANDING (N/A ) ESOPHAGOGASTRODUODENOSCOPY (EGD) WITH PROPOFOL (N/A ) BIOPSY  Patient location during evaluation: PACU Anesthesia Type: General Level of consciousness: awake and alert and oriented Pain management: pain level controlled Vital Signs Assessment: post-procedure vital signs reviewed and stable Respiratory status: spontaneous breathing Cardiovascular status: blood pressure returned to baseline and stable Postop Assessment: no apparent nausea or vomiting Anesthetic complications: no     Last Vitals:  Vitals:   01/04/19 1210  BP: (!) 163/88  Pulse: 61  Resp: 20  Temp: 36.7 C  SpO2: 98%    Last Pain:  Vitals:   01/04/19 1330  TempSrc:   PainSc: 0-No pain                 Khadijatou Borak

## 2019-01-04 NOTE — H&P (Addendum)
Primary Care Physician:  Iona Beard, MD Primary Gastroenterologist:  Dr. Oneida Alar  Pre-Procedure History & Physical: HPI:  Stephen Koch is a 58 y.o. male here for SCREENING: PUD/BRBPR.  Past Medical History:  Diagnosis Date  . Arthritis   . Chronic back pain    Forklift injury  . Essential hypertension, benign   . GERD (gastroesophageal reflux disease)   . History of medication noncompliance    Past Surgical History:  Procedure Laterality Date  . COLONOSCOPY N/A 07/06/2012   [id number] mucosa in the terminal ileum/Moderate sized internal hemorrhoids  . ESOPHAGOGASTRODUODENOSCOPY N/A 04/08/2013   [id number] gastric ulcer/duodenal inflammation. 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 Left knee surgery Left    arthroscopy  . QUADRICEPS TENDON REPAIR Left 03/05/2017   Procedure: REPAIR QUADRICEP TENDON;  Surgeon: Carole Civil, MD;  Location: [facility];  Service: Orthopedics;  Laterality: Left;  . Right arm surgery     tendon repair   Prior to Admission medications   Medication Sig Start Date End Date Taking? Authorizing Provider  hydrocortisone (ANUSOL-HC) 2.5 % rectal cream Place 1 application rectally 2 (two) times daily. Patient taking differently: Place 1 application rectally 2 (two) times daily as needed for hemorrhoids.  10/20/18  Yes Annitta Needs, NP  Multiple Vitamin (MULTIVITAMIN WITH MINERALS) TABS tablet Take 1 tablet by mouth daily.   Yes [provider]  naproxen sodium (ALEVE) 220 MG tablet Take 660 mg by mouth 2 (two) times daily as needed (PAIN.).   Yes [provider]  polyethylene glycol-electrolytes (TRILYTE) 420 g solution Take 4,000 mLs by mouth as directed. 10/20/18  Yes Davon Abdelaziz L, MD  amLODipine (NORVASC) 5 MG tablet Take 1 tablet (5 mg total) by mouth daily. 10/20/18   Annitta Needs, NP  HYDROcodone-acetaminophen (NORCO/VICODIN) 5-325 MG tablet Take 1 tablet by mouth every 4  (four) hours as needed. Patient not taking: Reported on 12/27/2018 07/06/18   Evalee Jefferson, PA-C    Allergies as of 10/21/2018 - Review Complete 10/20/2018  Allergen Reaction Noted  . Ibuprofen Other (See Comments) 04/06/2015   Family History  Problem Relation Age of Onset  . Heart disease Father   . Colon cancer Neg Hx   . Colon polyps Neg Hx    Social History   Socioeconomic History  . Marital status: Married    Spouse name: Not on file  . Number of children: 4  . Years of education: Not on file  . Highest education level: Not on file  Occupational History  . Occupation: Unemployed; previously Proofreader work  Scientific laboratory technician  . Financial resource strain: Not on file  . Food insecurity    Worry: Not on file    Inability: Not on file  . Transportation needs    Medical: Not on file    Non-medical: Not on file  Tobacco Use  . Smoking status: Current Every Day Smoker    Packs/day: 0.25    Years: 40.00    Pack years: 10.00    Types: Cigarettes  . Smokeless tobacco: Never Used  . Tobacco comment: smokes about 6-7 per day   Substance and Sexual Activity  . Alcohol use: Yes    Alcohol/week: 0.0 standard drinks    Comment: beer 4-5 per day.   . Drug use: Yes    Types: Marijuana    Comment: Marijuana 3 times per week   . Sexual activity: Yes    Birth control/protection:  None  Lifestyle  . Physical activity    Days per week: Not on file    Minutes per session: Not on file  . Stress: Not on file  Relationships  . Social Herbalist on phone: Not on file    Gets together: Not on file    Attends religious service: Not on file    Active member of club or organization: Not on file    Attends meetings of clubs or organizations: Not on file    Relationship status: Not on file  . Intimate partner violence    Fear of current or ex partner: Not on file    Emotionally abused: Not on file    Physically abused: Not on file    Forced sexual activity: Not on file  Other  Topics Concern  . Not on file  Social History Narrative   Lives w/ grandma   Review of Systems: See HPI, otherwise negative ROS  Physical Exam: BP (!) 163/88   Pulse 61   Temp 98.1 F (36.7 C) (Oral)   Resp 20   Ht 6' 2.5" (1.892 m)   Wt 98 kg   SpO2 98%   BMI 27.36 kg/m  General:   Alert,  pleasant and cooperative in NAD Head:  Normocephalic and atraumatic. Neck:  Supple; Lungs:  Clear throughout to auscultation.    Heart:  Regular rate and rhythm. Abdomen:  Soft, nontender and nondistended. Normal bowel sounds, without guarding, and without rebound.   Neurologic:  Alert and  oriented x4;  grossly normal neurologically.  Impression/Plan:    SCREENING: PUD/BRBPR  Plan:  1. EGD/TCS/?hemorrhoid banding TODAY. DISCUSSED PROCEDURE, BENEFITS, & RISKS: < 1% chance of medication reaction, bleeding, perforation, ASPIRATION, PELVIC VEIN SEPSIS, or rupture of spleen/liver requiring surgery to fix it and missed polyps < 1 cm 10-20% of the time.

## 2019-01-04 NOTE — Transfer of Care (Signed)
Immediate Anesthesia Transfer of Care Note  Patient: Stephen Koch  Procedure(s) Performed: COLONOSCOPY WITH PROPOFOL (N/A ) HEMORRHOID BANDING (N/A ) ESOPHAGOGASTRODUODENOSCOPY (EGD) WITH PROPOFOL (N/A ) BIOPSY  Patient Location: PACU  Anesthesia Type:General  Level of Consciousness: awake  Airway & Oxygen Therapy: Patient Spontanous Breathing  Post-op Assessment: Report given to RN  Post vital signs: Reviewed and stable  Last Vitals:  Vitals Value Taken Time  BP 142/73 01/04/19 1427  Temp    Pulse    Resp 22 01/04/19 1428  SpO2    Vitals shown include unvalidated device data.  Last Pain:  Vitals:   01/04/19 1330  TempSrc:   PainSc: 0-No pain         Complications: No apparent anesthesia complications

## 2019-01-04 NOTE — Op Note (Signed)
[facility] Patient Name: Stephen Koch Procedure Date: 01/04/2019 1:24 PM MRN: [id number] Date of Birth: 1960/10/15 Attending MD: Barney Drain MD, MD CSN: [id number] Age: 58 Admit Type: Inpatient Procedure:                Colonoscopy WITH HEMORRHOID BANDING x3 Indications:              Hematochezia ON NAPROXEN PRN. Providers:                Barney Drain MD, MD, Jeanann Lewandowsky. Sharon Seller, RN, Nelma Rothman, Technician Referring MD:             Barrie Folk. Hill MD, MD Medicines:                Propofol per Anesthesia Complications:            No immediate complications. Estimated Blood Loss:     Estimated blood loss was minimal. Procedure:                Pre-Anesthesia Assessment:                           - Prior to the procedure, a History and Physical                            was performed, and patient medications and                            allergies were reviewed. The patient's tolerance of                            previous anesthesia was also reviewed. The risks                            and benefits of the procedure and the sedation                            options and risks were discussed with the patient.                            All questions were answered, and informed consent                            was obtained. Prior Anticoagulants: The patient has                            taken no previous anticoagulant or antiplatelet                            agents except for NSAID medication. ASA Grade                            Assessment: II - A patient with mild systemic  disease. After reviewing the risks and benefits,                            the patient was deemed in satisfactory condition to                            undergo the procedure. After obtaining informed                            consent, the colonoscope was passed under direct                            vision. Throughout the procedure, the patient's                             blood pressure, pulse, and oxygen saturations were                            monitored continuously. The [id number]                            scope was introduced through the anus and advanced                            to the 10 cm into the ileum. The colonoscopy was                            somewhat difficult due to a tortuous colon.                            Successful completion of the procedure was aided by                            straightening and shortening the scope to obtain                            bowel loop reduction and COLOWRAP. The patient                            tolerated the procedure well. The quality of the                            bowel preparation was excellent. The terminal                            ileum, ileocecal valve, appendiceal orifice, and                            rectum were photographed. Scope In: 1:41:19 PM Scope Out: 2:04:49 PM Scope Withdrawal Time: 0 hours 20 minutes 46 seconds  Total Procedure Duration: 0 hours 23 minutes 30 seconds  Findings:      The terminal ileum appeared normal.      Internal hemorrhoids were found during perianal exam.  The hemorrhoids       were Grade III (internal hemorrhoids that prolapse but require manual       reduction). Three bands were successfully placed. A slow ooze remained       at the end of the procedure.      The exam was otherwise without abnormality.      The recto-sigmoid colon and sigmoid colon were mildly tortuous. Impression:               - The examined portion of the ileum was normal.                           - Internal hemorrhoids. Banded.                           - The examination was otherwise normal.                           - Tortuous LEFT colon. Moderate Sedation:      Per Anesthesia Care Recommendation:           - Patient has a contact number available for                            emergencies. The signs and symptoms of potential                             delayed complications were discussed with the                            patient. Return to normal activities tomorrow.                            Written discharge instructions were provided to the                            patient.                           - High fiber diet.                           - Patient medication history reviewed. Patient is                            appropriately not taking any medications.                           - Repeat colonoscopy in 10 years for surveillance.                           - Return to GI office in 6 months. Procedure Code(s):        --- Professional ---                           [id number], Colonoscopy, flexible; with band ligation(s)                            (  eg, hemorrhoids) Diagnosis Code(s):        --- Professional ---                           K64.2, Third degree hemorrhoids                           K92.1, Melena (includes Hematochezia)                           Q43.8, Other specified congenital malformations of                            intestine CPT copyright 2019 [employer]. All rights reserved. The codes documented in this report are preliminary and upon coder review may  be revised to meet current compliance requirements. Barney Drain, MD Barney Drain MD, MD 01/04/2019 2:35:27 PM This report has been signed electronically. Number of Addenda: 0

## 2019-01-04 NOTE — Op Note (Signed)
[facility] Patient Name: Stephen Koch Procedure Date: 01/04/2019 2:05 PM MRN: [id number] Date of Birth: 1960/09/29 Attending MD: Barney Drain MD, MD CSN: [id number] Age: 58 Admit Type: Outpatient Procedure:                Upper GI endoscopy WITH COLD FORCEPS BIOPSY Indications:              Follow-up of peptic ulcer-PMHx: H PYLORI                            GASTRITIS(2014),ON NAPROXEN PRN AND NO PPI. Providers:                Barney Drain MD, MD, Jeanann Lewandowsky. Sharon Seller, RN, Nelma Rothman, Technician Referring MD:             Barrie Folk. Hill MD, MD Medicines:                Propofol per Anesthesia Complications:            No immediate complications. Estimated Blood Loss:     Estimated blood loss was minimal. Procedure:                Pre-Anesthesia Assessment:                           - Prior to the procedure, a History and Physical                            was performed, and patient medications and                            allergies were reviewed. The patient's tolerance of                            previous anesthesia was also reviewed. The risks                            and benefits of the procedure and the sedation                            options and risks were discussed with the patient.                            All questions were answered, and informed consent                            was obtained. Prior Anticoagulants: The patient has                            taken no previous anticoagulant or antiplatelet                            agents except for NSAID medication. ASA Grade  Assessment: II - A patient with mild systemic                            disease. After reviewing the risks and benefits,                            the patient was deemed in satisfactory condition to                            undergo the procedure. After obtaining informed                            consent, the endoscope was passed  under direct                            vision. Throughout the procedure, the patient's                            blood pressure, pulse, and oxygen saturations were                            monitored continuously. The [id number])                            scope was introduced through the mouth, and                            advanced to the second part of duodenum. The upper                            GI endoscopy was accomplished without difficulty.                            The patient tolerated the procedure well. Scope In: 2:11:35 PM Scope Out: 2:16:26 PM Total Procedure Duration: 0 hours 4 minutes 51 seconds  Findings:      The examined esophagus was normal.      Segmental moderate inflammation characterized by congestion (edema),       erosions and erythema was found in the gastric body and in the gastric       antrum. Biopsies(2:BODY, 1:INCISURA, 2:ANTRUM) were taken with a cold       forceps for Helicobacter pylori testing.      The examined duodenum was normal. Impression:               - MODERATE Gastritis. Biopsied. Moderate Sedation:      Per Anesthesia Care Recommendation:           - Patient has a contact number available for                            emergencies. The signs and symptoms of potential                            delayed complications were discussed with the  patient. Return to normal activities tomorrow.                            Written discharge instructions were provided to the                            patient.                           - High fiber diet.                           - Continue present medications. START OMEPRAZOLE                            DAILY.                           - Await pathology results.                           - Return to GI office in 6 months. Procedure Code(s):        --- Professional ---                           [phone], Esophagogastroduodenoscopy, flexible,                             transoral; with biopsy, single or multiple Diagnosis Code(s):        --- Professional ---                           K29.70, Gastritis, unspecified, without bleeding                           K27.9, Peptic ulcer, site unspecified, unspecified                            as acute or chronic, without hemorrhage or                            perforation CPT copyright 2019 American Medical Association. All rights reserved. The codes documented in this report are preliminary and upon coder review may  be revised to meet current compliance requirements. Barney Drain, MD Barney Drain MD, MD 01/04/2019 2:40:48 PM This report has been signed electronically. Number of Addenda: 0

## 2019-01-04 NOTE — Anesthesia Preprocedure Evaluation (Signed)
Anesthesia Evaluation  Patient identified by MRN, date of birth, ID band Patient awake    Reviewed: Allergy & Precautions, NPO status , Patient's Chart, lab work & pertinent test results  Airway Mallampati: II  TM Distance: >3 FB Neck ROM: Full    Dental no notable dental hx. (+) Edentulous Upper, Missing, Poor Dentition   Pulmonary neg pulmonary ROS, Current Smoker and Patient abstained from smoking.,    Pulmonary exam normal breath sounds clear to auscultation       Cardiovascular Exercise Tolerance: Good hypertension, negative cardio ROS Normal cardiovascular examI Rhythm:Regular Rate:Normal  States BP meds for over a month  160/88 WTP Denies CP  Reports can walk > one mile    Neuro/Psych negative neurological ROS  negative psych ROS   GI/Hepatic Neg liver ROS, Denies GERD or current reflux meds    Endo/Other  negative endocrine ROS  Renal/GU negative Renal ROS  negative genitourinary   Musculoskeletal  (+) Arthritis , Osteoarthritis,    Abdominal   Peds negative pediatric ROS (+)  Hematology negative hematology ROS (+)   Anesthesia Other Findings   Reproductive/Obstetrics negative OB ROS                             Anesthesia Physical Anesthesia Plan  ASA: II  Anesthesia Plan: General   Post-op Pain Management:    Induction: Intravenous  PONV Risk Score and Plan: 1 and Propofol infusion, TIVA and Treatment may vary due to age or medical condition  Airway Management Planned:   Additional Equipment:   Intra-op Plan:   Post-operative Plan:   Informed Consent: I have reviewed the patients History and Physical, chart, labs and discussed the procedure including the risks, benefits and alternatives for the proposed anesthesia with the patient or authorized representative who has indicated his/her understanding and acceptance.     Dental advisory given  Plan Discussed  with: CRNA  Anesthesia Plan Comments: (Plan Full PPE use  Plan GA with GETA as needed d/w pt -WTP with same after Q&A)        Anesthesia Quick Evaluation

## 2019-01-07 ENCOUNTER — Encounter (HOSPITAL_COMMUNITY): Payer: Self-pay | Admitting: Gastroenterology

## 2019-01-07 ENCOUNTER — Telehealth: Payer: Self-pay | Admitting: Gastroenterology

## 2019-01-07 NOTE — Discharge Instructions (Signed)
YOUR UPPER ENDOSCOPY SHOWED A small HIATAL HERNIA, GASTRITIS & duodenitis.   You have internal hemorrhoids. I PLACED 3 BANDS TO TREAT YOUR BLEEDING. I biopsied your stomach.   Please CALL [phone] FOR RECTAL BLEEDING/PAIN, FEVER OR DIFFICULTY URINATING.  FOLLOW A HIGH FIBER DIET. AVOID ITEMS THAT CAUSE BLOATING & GAS. SEE INFO BELOW.   YOUR BIOPSY RESULTS WILL BE BACK IN 5 BUSINESS DAYS.  FOLLOW UP in 6 mos.   ENDOSCOPY Care After Read the instructions outlined below and refer to this sheet in the next week. These discharge instructions provide you with general information on caring for yourself after you leave the hospital. While your treatment has been planned according to the most current medical practices available, unavoidable complications occasionally occur. If you have any problems or questions after discharge, call DR. Keiva Dina, [phone].  ACTIVITY  You may resume your regular activity, but move at a slower pace for the next 24 hours.   Take frequent rest periods for the next 24 hours.   Walking will help get rid of the air and reduce the bloated feeling in your belly (abdomen).   No driving for 24 hours (because of the medicine (anesthesia) used during the test).   You may shower.   Do not sign any important legal documents or operate any machinery for 24 hours (because of the anesthesia used during the test).    NUTRITION  Drink plenty of fluids.   You may resume your normal diet as instructed by your doctor.   Begin with a light meal and progress to your normal diet. Heavy or fried foods are harder to digest and may make you feel sick to your stomach (nauseated).   Avoid alcoholic beverages for 24 hours or as instructed.    MEDICATIONS  You may resume your normal medications.   WHAT YOU CAN EXPECT TODAY  Some feelings of bloating in the abdomen.   Passage of more gas than usual.   Spotting of blood in your stool or on the toilet paper  .  IF  YOU HAD POLYPS REMOVED DURING THE ENDOSCOPY:  Eat a soft diet IF YOU HAVE NAUSEA, BLOATING, ABDOMINAL PAIN, OR VOMITING.    FINDING OUT THE RESULTS OF YOUR TEST Not all test results are available during your visit. DR. Oneida Alar WILL CALL YOU WITHIN 7 DAYS OF YOUR PROCEDUE WITH YOUR RESULTS. Do not assume everything is normal if you have not heard from DR. Saundra Gin IN ONE WEEK, CALL HER OFFICE AT [phone].  SEEK IMMEDIATE MEDICAL ATTENTION AND CALL THE OFFICE: [phone] IF:  You have more than a spotting of blood in your stool.   Your belly is swollen (abdominal distention).   You are nauseated or vomiting.   You have a temperature over 101F.   You have abdominal pain or discomfort that is severe or gets worse throughout the day.    HEMORRHOIDAL BANDING COMPLICATIONS:  COMMON: 1. MINOR PAIN  UNCOMMON: 1. ABSCESS 2. BAND FALLS OFF 3. PROLAPSE OF HEMORRHOIDS AND PAIN 4. ULCER BLEEDING  A. USUALLY SELF-LIMITED: MAY LAST 3-5 DAYS  B. MAY REQUIRE INTERVENTION: 1-2 WEEKS AFTER INTERACTIONS 5. NECROTIZING PELVIC SEPSIS  A. SYMPTOMS: FEVER, PAIN, DIFFICULTY URINATING   Gastritis  Gastritis is an inflammation (the body's way of reacting to injury and/or infection) of the stomach. It is often caused by bacterial (germ) infections. It can also be caused BY ASPIRIN, BC/GOODY POWDER'S, (IBUPROFEN) MOTRIN, OR ALEVE (NAPROXEN), chemicals (including alcohol), SPICY FOODS, and medications. This illness may  be associated with generalized malaise (feeling tired, not well), UPPER ABDOMINAL STOMACH cramps, and fever. One common bacterial cause of gastritis is an organism known as H. Pylori. This can be treated with antibiotics.

## 2019-01-07 NOTE — Telephone Encounter (Signed)
Please call pt. His stomach Bx shows gastritis due to NAPROXEN. HIS AVS WAS INCORRECT. HE DID NOT HAVE ANY POLYPS REMOVED.   FOLLOW A HIGH FIBER DIET. AVOID ITEMS THAT CAUSE BLOATING & GAS. TO PREVENT ULCERS AND GASTRITIS DUE TO NAPROXEN, CONTINUE OMEPRAZOLE.  TAKE 30 MINUTES PRIOR TO YOUR FIRST MEAL.  FOLLOW UP in 6 mos. NEXT COLONOSCOPY IN 10 YEARS.

## 2019-01-10 ENCOUNTER — Encounter: Payer: Self-pay | Admitting: Gastroenterology

## 2019-01-10 ENCOUNTER — Telehealth: Payer: Self-pay | Admitting: Gastroenterology

## 2019-01-10 NOTE — Telephone Encounter (Signed)
ON RECALL AND SCHEDULED  °

## 2019-01-10 NOTE — Telephone Encounter (Signed)
[id number]  PATIENT RETURNED CALL, PLEASE CALL BACK

## 2019-01-10 NOTE — Telephone Encounter (Signed)
Called and could not leave a message. Mailing a letter to call.

## 2019-01-11 DIAGNOSIS — I10 Essential (primary) hypertension: Secondary | ICD-10-CM | POA: Diagnosis not present

## 2019-01-11 DIAGNOSIS — G609 Hereditary and idiopathic neuropathy, unspecified: Secondary | ICD-10-CM | POA: Diagnosis not present

## 2019-01-11 DIAGNOSIS — E785 Hyperlipidemia, unspecified: Secondary | ICD-10-CM | POA: Diagnosis not present

## 2019-01-17 NOTE — Telephone Encounter (Signed)
See other phone note for documentation.  

## 2019-01-17 NOTE — Telephone Encounter (Signed)
Pt returned call. Pt was notified of results. Pt's next TCS is in 10 years and his next apt is schedule for 6 months 06/2019 with AB.

## 2019-02-16 ENCOUNTER — Ambulatory Visit: Payer: Medicare Other | Admitting: Gastroenterology

## 2019-06-16 ENCOUNTER — Ambulatory Visit: Payer: Medicare Other | Admitting: Podiatry

## 2019-06-23 ENCOUNTER — Ambulatory Visit (INDEPENDENT_AMBULATORY_CARE_PROVIDER_SITE_OTHER): Payer: Medicare Other | Admitting: Podiatry

## 2019-06-23 ENCOUNTER — Other Ambulatory Visit: Payer: Self-pay | Admitting: Podiatry

## 2019-06-23 ENCOUNTER — Ambulatory Visit (INDEPENDENT_AMBULATORY_CARE_PROVIDER_SITE_OTHER): Payer: Medicare Other

## 2019-06-23 ENCOUNTER — Other Ambulatory Visit: Payer: Self-pay

## 2019-06-23 VITALS — BP 158/96 | HR 74 | Temp 97.1°F

## 2019-06-23 DIAGNOSIS — M79672 Pain in left foot: Secondary | ICD-10-CM

## 2019-06-23 DIAGNOSIS — M79671 Pain in right foot: Secondary | ICD-10-CM

## 2019-06-23 DIAGNOSIS — G5762 Lesion of plantar nerve, left lower limb: Secondary | ICD-10-CM | POA: Diagnosis not present

## 2019-06-23 DIAGNOSIS — G5761 Lesion of plantar nerve, right lower limb: Secondary | ICD-10-CM

## 2019-06-23 DIAGNOSIS — G5783 Other specified mononeuropathies of bilateral lower limbs: Secondary | ICD-10-CM | POA: Diagnosis not present

## 2019-06-23 DIAGNOSIS — D3610 Benign neoplasm of peripheral nerves and autonomic nervous system, unspecified: Secondary | ICD-10-CM

## 2019-06-23 NOTE — Progress Notes (Signed)
  Subjective:  Patient ID: Stephen Koch, male    DOB: 20-Jun-1960,  MRN: [id number]  Chief Complaint  Patient presents with  . Foot Problem    Bilateral foot pain - entire plantar foot. x3 years. Pt stated, "I have flat feet; I think my arches have fallen. Sometimes they hurt (7/10), and sometimes they're just numb".    59 y.o. male presents with the above complaint. History confirmed with patient.   Objective:  Physical Exam: warm, good capillary refill, no trophic changes or ulcerative lesions, normal DP and PT pulses and normal sensory exam. Left Foot: tenderness between the 3rd and 5th metatarsal head  Right Foot: tenderness between the 3rd and 5th metatarsal head    Assessment:   1. Morton neuroma, left   2. Morton neuroma, right   3. Pain in both feet      Plan:  Patient was evaluated and treated and all questions answered.  Morton Neuroma -Educated on etiology -Educated on padding and proper shoegear -XR reviewed with patient -Injection delivered to the affected interspaces  Procedure: Neuroma Injection Location: Bilateral 3rd interspace Skin Prep: Alcohol. Injectate: 0.5 cc 0.5% marcaine plain, 0.5 cc celestone . Disposition: Patient tolerated procedure well. Injection site dressed with a band-aid.  Return in about 3 weeks (around 07/14/2019) for Neuroma, Bilateral.

## 2019-07-12 ENCOUNTER — Ambulatory Visit: Payer: Medicare Other | Admitting: Gastroenterology

## 2019-07-20 ENCOUNTER — Ambulatory Visit (INDEPENDENT_AMBULATORY_CARE_PROVIDER_SITE_OTHER): Payer: Medicare Other | Admitting: Gastroenterology

## 2019-07-20 ENCOUNTER — Other Ambulatory Visit: Payer: Self-pay

## 2019-07-20 ENCOUNTER — Encounter: Payer: Self-pay | Admitting: Gastroenterology

## 2019-07-20 VITALS — BP 152/79 | HR 69 | Temp 97.1°F | Ht 75.0 in | Wt 215.0 lb

## 2019-07-20 DIAGNOSIS — K219 Gastro-esophageal reflux disease without esophagitis: Secondary | ICD-10-CM | POA: Insufficient documentation

## 2019-07-20 DIAGNOSIS — K625 Hemorrhage of anus and rectum: Secondary | ICD-10-CM

## 2019-07-20 MED ORDER — OMEPRAZOLE 20 MG PO CPDR: DELAYED_RELEASE_CAPSULE | ORAL | 3 refills | Status: DC

## 2019-07-20 MED ORDER — HYDROCORTISONE (PERIANAL) 2.5 % EX CREA: 1.0000 "application " | TOPICAL_CREAM | Freq: Two times a day (BID) | CUTANEOUS | 1 refills | Status: DC

## 2019-07-20 NOTE — Progress Notes (Signed)
Referring Provider: Iona Beard, MD Primary Care Physician:  Iona Beard, MD  Primary GI: Dr. Oneida Alar   Chief Complaint  Patient presents with  . Rectal Bleeding    "from time to time". denies straining    HPI:   Stephen Koch is a 59 y.o. male presenting today with a history of PUD in remote past, s/p recent EGD with moderate gastritis, no ulcer. Colonoscopy also completed in interim from last visit with Grade 3 hemorrhoids s/p banding of all columns.    Seeing occasional bright red blood per rectum, about 1-2 times in 2 weeks. No straining. No rectal bleeding. No itching or burning. No prolapsing of tissue. No pain with defecation. Has noticed improvement in banding overall. Wants to hold off on additional evaluate for hemorrhoid banding.   States some abdominal pain around belly button about once or twice a week. Feels like acid gets worse and acts up when this happens. Today ate sausage, egg, and waffles, then ate an orange. Orange was too much.    Past Medical History:  Diagnosis Date  . Arthritis   . Chronic back pain    Forklift injury  . Essential hypertension, benign   . GERD (gastroesophageal reflux disease)   . History of medication noncompliance     Past Surgical History:  Procedure Laterality Date  . BIOPSY  01/04/2019   Procedure: BIOPSY;  Surgeon: Danie Binder, MD;  Location: [facility];  Service: Endoscopy;;  gastric  . COLONOSCOPY N/A 07/06/2012   [id number] mucosa in the terminal ileum/Moderate sized internal hemorrhoids  . COLONOSCOPY WITH PROPOFOL N/A 01/04/2019   normal TI, internal hemorrhoids Grade 3 s/p band placement X 3.   . ESOPHAGOGASTRODUODENOSCOPY N/A 04/08/2013   [id number] gastric ulcer/duodenal inflammation. 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 ESOPHAGOGASTRODUODENOSCOPY (EGD) WITH PROPOFOL N/A 01/04/2019   moderate gastritis s/p biopsy, negative Hpylori  . HEMORRHOID BANDING N/A  01/04/2019   Procedure: HEMORRHOID BANDING;  Surgeon: Danie Binder, MD;  Location: [facility];  Service: Endoscopy;  Laterality: N/A;  . Left knee surgery Left    arthroscopy  . QUADRICEPS TENDON REPAIR Left 03/05/2017   Procedure: REPAIR QUADRICEP TENDON;  Surgeon: Carole Civil, MD;  Location: [facility];  Service: Orthopedics;  Laterality: Left;  . Right arm surgery     tendon repair    Current Outpatient Medications  Medication Sig Dispense Refill  . amLODipine (NORVASC) 5 MG tablet Take 1 tablet (5 mg total) by mouth daily. 30 tablet 1  . atorvastatin (LIPITOR) 20 MG tablet Take 20 mg by mouth at bedtime.    . gabapentin (NEURONTIN) 300 MG capsule     . methocarbamol (ROBAXIN) 500 MG tablet Take 500 mg by mouth 2 (two) times daily.    . Multiple Vitamin (MULTIVITAMIN WITH MINERALS) TABS tablet Take 1 tablet by mouth daily.    . naproxen sodium (ALEVE) 220 MG tablet Take 660 mg by mouth 2 (two) times daily as needed (PAIN.).    Marland Kitchen omeprazole (PRILOSEC) 20 MG capsule 1 PO 30 MINS PRIOR TO BREAKFAST and may take 1 before dinner if needed. 180 capsule 3  . hydrocortisone (ANUSOL-HC) 2.5 % rectal cream Place 1 application rectally 2 (two) times daily. 30 g 1   No current facility-administered medications for this visit.    Allergies as of 07/20/2019 - Review Complete 07/20/2019  Allergen Reaction Noted  . Ibuprofen Other (See Comments) 04/06/2015  Family History  Problem Relation Age of Onset  . Heart disease Father   . Colon cancer Neg Hx   . Colon polyps Neg Hx     Social History   Socioeconomic History  . Marital status: Married    Spouse name: Not on file  . Number of children: 4  . Years of education: Not on file  . Highest education level: Not on file  Occupational History  . Occupation: Unemployed; previously Proofreader work  Tobacco Use  . Smoking status: Current Every Day Smoker    Packs/day: 0.25    Years: 40.00    Pack years: 10.00    Types:  Cigarettes  . Smokeless tobacco: Never Used  . Tobacco comment: smokes about 6-7 per day   Substance and Sexual Activity  . Alcohol use: Yes    Alcohol/week: 0.0 standard drinks    Comment: beer 4-5 per day.   . Drug use: Yes    Types: Marijuana    Comment: Marijuana 3 times per week   . Sexual activity: Yes    Birth control/protection: None  Other Topics Concern  . Not on file  Social History Narrative   Lives w/ grandma   Social Determinants of Health   Financial Resource Strain:   . Difficulty of Paying Living Expenses:   Food Insecurity:   . Worried About Charity fundraiser in the Last Year:   . Arboriculturist in the Last Year:   Transportation Needs:   . Film/video editor (Medical):   Marland Kitchen Lack of Transportation (Non-Medical):   Physical Activity:   . Days of Exercise per Week:   . Minutes of Exercise per Session:   Stress:   . Feeling of Stress :   Social Connections:   . Frequency of Communication with Friends and Family:   . Frequency of Social Gatherings with Friends and Family:   . Attends Religious Services:   . Active Member of Clubs or Organizations:   . Attends Archivist Meetings:   Marland Kitchen Marital Status:     Review of Systems: Gen: Denies fever, chills, anorexia. Denies fatigue, weakness, weight loss.  CV: Denies chest pain, palpitations, syncope, peripheral edema, and claudication. Resp: Denies dyspnea at rest, cough, wheezing, coughing up blood, and pleurisy. GI: see HPI Derm: Denies rash, itching, dry skin Psych: Denies depression, anxiety, memory loss, confusion. No homicidal or suicidal ideation.  Heme: see HPI  Physical Exam: BP (!) 152/79   Pulse 69   Temp (!) 97.1 F (36.2 C) (Oral)   Ht 6\' 3"  (1.905 m)   Wt 215 lb (97.5 kg)   BMI 26.87 kg/m  General:   Alert and oriented. No distress noted. Pleasant and cooperative.  Head:  Normocephalic and atraumatic. Eyes:  Conjuctiva clear without scleral icterus. Mouth:  Mask in  place Abdomen:  +BS, soft, non-tender and non-distended. No rebound or guarding. No HSM or masses noted. Msk:  Symmetrical without gross deformities. Normal posture. Extremities:  Without edema. Neurologic:  Alert and  oriented x4 Psych:  Alert and cooperative. Normal mood and affect.  ASSESSMENT: Stephen Koch is a 59 y.o. male presenting today with remote history of PUD, s/p recent EGD with moderate gastritis, no ulcer. Colonoscopy also completed in interim from last visit with Grade 3 hemorrhoids s/p banding of all columns.   He has noted improvement overall s/p banding but will occasionally have low-volume hematochezia but denying straining, itching, burning. He may benefit from neutral  position banding due to history of Grade 3 hemorrhoids. He is declining any further evaluation and will call if persistent.   GERD: vague abdominal pain likely due to dietary choices in setting of known gastritis. Will increase Prilosec to BID for now, strict GERD diet, and call if persistent. No alarm signs/symptoms.    PLAN:   Call if persistent bleeding  Prilosec BID for now  GERD diet provided  Return in 6 months  Annitta Needs, PhD, [facility]

## 2019-07-20 NOTE — Patient Instructions (Addendum)
Please call me if bleeding worsens, you have pain, itching, prolapsing of tissue. We can band as an outpatient here if needed. I have sent in a cream in the meantime!  I have increased Prilosec to twice a day. Make sure to take each morning 30 minutes before breakfast, but you can take it before dinner if needed. I included a handout on reflux for you.  Call if any further pain!  We will see you in 6 months!  I enjoyed seeing you again today! As you know, I value our relationship and want to provide genuine, compassionate, and quality care. I welcome your feedback. If you receive a survey regarding your visit,  I greatly appreciate you taking time to fill this out. See you next time!  Annitta Needs, PhD, ANP-BC [facility]   Food Choices for Gastroesophageal Reflux Disease, Adult When you have gastroesophageal reflux disease (GERD), the foods you eat and your eating habits are very important. Choosing the right foods can help ease the discomfort of GERD. Consider working with a diet and nutrition specialist (dietitian) to help you make healthy food choices. What general guidelines should I follow?  Eating plan  Choose healthy foods low in fat, such as fruits, vegetables, whole grains, low-fat dairy products, and lean meat, fish, and poultry.  Eat frequent, small meals instead of three large meals each day. Eat your meals slowly, in a relaxed setting. Avoid bending over or lying down until 2-3 hours after eating.  Limit high-fat foods such as fatty meats or fried foods.  Limit your intake of oils, butter, and shortening to less than 8 teaspoons each day.  Avoid the following: ? Foods that cause symptoms. These may be different for different people. Keep a food diary to keep track of foods that cause symptoms. ? Alcohol. ? Drinking large amounts of liquid with meals. ? Eating meals during the 2-3 hours before bed.  Cook foods using methods other than frying. This may  include baking, grilling, or broiling. Lifestyle  Maintain a healthy weight. Ask your health care provider what weight is healthy for you. If you need to lose weight, work with your health care provider to do so safely.  Exercise for at least 30 minutes on 5 or more days each week, or as told by your health care provider.  Avoid wearing clothes that fit tightly around your waist and chest.  Do not use any products that contain nicotine or tobacco, such as cigarettes and e-cigarettes. If you need help quitting, ask your health care provider.  Sleep with the head of your bed raised. Use a wedge under the mattress or blocks under the bed frame to raise the head of the bed. What foods are not recommended? The items listed may not be a complete list. Talk with your dietitian about what dietary choices are best for you. Grains Pastries or quick breads with added fat. [place] toast. Vegetables Deep fried vegetables. [place] fries. Any vegetables prepared with added fat. Any vegetables that cause symptoms. For some people this may include tomatoes and tomato products, chili peppers, onions and garlic, and horseradish. Fruits Any fruits prepared with added fat. Any fruits that cause symptoms. For some people this may include citrus fruits, such as oranges, grapefruit, pineapple, and lemons. Meats and other protein foods High-fat meats, such as fatty beef or pork, hot dogs, ribs, ham, sausage, salami and bacon. Fried meat or protein, including fried fish and fried chicken. Nuts and nut butters. Dairy Whole  milk and chocolate milk. Sour cream. Cream. Ice cream. Cream cheese. Milk shakes. Beverages Coffee and tea, with or without caffeine. Carbonated beverages. Sodas. Energy drinks. Fruit juice made with acidic fruits (such as orange or grapefruit). Tomato juice. Alcoholic drinks. Fats and oils Butter. Margarine. Shortening. Ghee. Sweets and desserts Chocolate and cocoa. Donuts. Seasoning and other  foods Pepper. Peppermint and spearmint. Any condiments, herbs, or seasonings that cause symptoms. For some people, this may include curry, hot sauce, or vinegar-based salad dressings. Summary  When you have gastroesophageal reflux disease (GERD), food and lifestyle choices are very important to help ease the discomfort of GERD.  Eat frequent, small meals instead of three large meals each day. Eat your meals slowly, in a relaxed setting. Avoid bending over or lying down until 2-3 hours after eating.  Limit high-fat foods such as fatty meat or fried foods. This information is not intended to replace advice given to you by your health care provider. Make sure you discuss any questions you have with your health care provider. Document Revised: 08/05/2018 Document Reviewed: 04/15/2016 Elsevier Patient Education  [place].

## 2019-07-21 ENCOUNTER — Ambulatory Visit (INDEPENDENT_AMBULATORY_CARE_PROVIDER_SITE_OTHER): Payer: Medicare Other | Admitting: Podiatry

## 2019-07-21 ENCOUNTER — Encounter: Payer: Self-pay | Admitting: Gastroenterology

## 2019-07-21 DIAGNOSIS — M2141 Flat foot [pes planus] (acquired), right foot: Secondary | ICD-10-CM | POA: Diagnosis not present

## 2019-07-21 DIAGNOSIS — M2142 Flat foot [pes planus] (acquired), left foot: Secondary | ICD-10-CM | POA: Diagnosis not present

## 2019-07-21 DIAGNOSIS — G5763 Lesion of plantar nerve, bilateral lower limbs: Secondary | ICD-10-CM | POA: Diagnosis not present

## 2019-07-21 NOTE — Progress Notes (Signed)
  Subjective:  Patient ID: Stephen Koch, male    DOB: 04-01-1961,  MRN: [id number]  Chief Complaint  Patient presents with  . Neuroma    Pt states previous injections were effective at reducing pain for 2 weeks but pain has returned.   . Foot Orthotics    Pt interested in custom orthotics for "flat feet/arch support"   59 y.o. male presents with the above complaint. History confirmed with patient.   Objective:  Physical Exam: warm, good capillary refill, no trophic changes or ulcerative lesions, normal DP and PT pulses and normal sensory exam. Left Foot: tenderness between the 3rd and 5th metatarsal head rectus foot type Right Foot: tenderness between the 3rd and 5th metatarsal head pes planus  Assessment:   1. Morton's metatarsalgia, neuralgia, or neuroma, bilateral   2. Pes planus of both feet    Plan:  Patient was evaluated and treated and all questions answered.  Morton Neuroma -Injection #2 Bilateral  Procedure: Neuroma Injection Location: Bilateral 3rd interspace Skin Prep: Alcohol. Injectate: 0.5 cc 0.5% marcaine plain, 0.5 cc celestone phosphate. Disposition: Patient tolerated procedure well. Injection site dressed with a band-aid.  Pes Planus R>L -Discussed CMOS vs prefab orthotics -Dispense Powerstep orthotics  Return in about 3 weeks (around 08/11/2019) for Neuroma, Bilateral.

## 2019-07-26 NOTE — Progress Notes (Signed)
Cc'ed to pcp °

## 2019-08-08 ENCOUNTER — Other Ambulatory Visit: Payer: Self-pay | Admitting: Family Medicine

## 2019-08-08 ENCOUNTER — Ambulatory Visit
Admission: RE | Admit: 2019-08-08 | Discharge: 2019-08-08 | Disposition: A | Discharge status: Home / Self Care | Payer: Medicare Other | Source: Ambulatory Visit | Attending: Family Medicine | Admitting: Family Medicine

## 2019-08-08 DIAGNOSIS — M25511 Pain in right shoulder: Secondary | ICD-10-CM

## 2019-08-12 ENCOUNTER — Ambulatory Visit: Payer: Medicare Other | Admitting: Podiatry

## 2019-09-08 ENCOUNTER — Ambulatory Visit: Payer: Medicare Other | Admitting: Podiatry

## 2019-12-05 ENCOUNTER — Other Ambulatory Visit: Payer: Self-pay

## 2019-12-05 ENCOUNTER — Encounter: Payer: Self-pay | Admitting: Gastroenterology

## 2019-12-05 ENCOUNTER — Ambulatory Visit (INDEPENDENT_AMBULATORY_CARE_PROVIDER_SITE_OTHER): Payer: Medicare Other | Admitting: Gastroenterology

## 2019-12-05 ENCOUNTER — Telehealth: Payer: Self-pay | Admitting: *Deleted

## 2019-12-05 VITALS — BP 147/87 | HR 67 | Temp 97.3°F | Ht 75.0 in | Wt 209.8 lb

## 2019-12-05 DIAGNOSIS — R1033 Periumbilical pain: Secondary | ICD-10-CM | POA: Diagnosis not present

## 2019-12-05 DIAGNOSIS — R630 Anorexia: Secondary | ICD-10-CM

## 2019-12-05 DIAGNOSIS — K625 Hemorrhage of anus and rectum: Secondary | ICD-10-CM | POA: Diagnosis not present

## 2019-12-05 NOTE — Progress Notes (Signed)
Primary Care Physician: Iona Beard, MD  Primary Gastroenterologist:  Formerly Barney Drain, MD   Chief Complaint  Patient presents with  . Abdominal Pain    mid abd  . Rectal Bleeding    bright red  . Hemorrhoids    HPI: Stephen Koch is a 59 y.o. male here for follow-up.  History of peptic ulcer disease in the remote past, EGD and colonoscopy September 2020 with moderate gastritis, grade 3 hemorrhoids status post banding of all 3 columns.  Patient also has a history of GERD.  Last seen in the office in March 2021.  Continues to have intermittent rectal bleeding. Can go weeks without problems. Last four days has had bleeding but slacking off now. Complains of mid abdominal pain off/on.  Was having this when he last was seen, been going on for some time now.  Sometimes has it with the bleeding but not always.  Not necessarily related to meals but he notes that he has diminished appetite because of the pain.  Some intermittent straining with stools but will see bleeding without straining. BM every few days.  When he has a bowel movement stools for the most part have been solved.  No n/v. Some heartburn but does not take PPI regularly. No dysphagia. Takes Aleve every other day. No ASA powders.  7 pound weight loss in the past year.  40 ounces of beer daily tried to cut back.    Wt Readings from Last 3 Encounters:  12/05/19 209 lb 12.8 oz (95.2 kg)  07/20/19 215 lb (97.5 kg)  01/04/19 216 lb (98 kg)     Current Outpatient Medications  Medication Sig Dispense Refill  . amLODipine (NORVASC) 5 MG tablet Take 1 tablet (5 mg total) by mouth daily. 30 tablet 1  . atorvastatin (LIPITOR) 20 MG tablet Take 20 mg by mouth at bedtime.    . gabapentin (NEURONTIN) 300 MG capsule Take 300 mg by mouth 2 (two) times daily.     . methocarbamol (ROBAXIN) 500 MG tablet Take 500 mg by mouth 2 (two) times daily.    . Multiple Vitamin (MULTIVITAMIN WITH MINERALS) TABS tablet Take 1 tablet by  mouth daily.    . naproxen sodium (ALEVE) 220 MG tablet Take 660 mg by mouth 2 (two) times daily as needed (PAIN.).    Marland Kitchen omeprazole (PRILOSEC) 20 MG capsule 1 PO 30 MINS PRIOR TO BREAKFAST and may take 1 before dinner if needed. 180 capsule 3   No current facility-administered medications for this visit.    Allergies as of 12/05/2019 - Review Complete 12/05/2019  Allergen Reaction Noted  . Ibuprofen Other (See Comments) 04/06/2015    ROS:  General: Negative for fever, chills, fatigue, weakness. See hpi ENT: Negative for hoarseness, difficulty swallowing , nasal congestion. CV: Negative for chest pain, angina, palpitations, dyspnea on exertion, peripheral edema.  Respiratory: Negative for dyspnea at rest, dyspnea on exertion, cough, sputum, wheezing.  GI: See history of present illness. GU:  Negative for dysuria, hematuria, urinary incontinence, urinary frequency, nocturnal urination.  Endo: Negative for unusual weight change.    Physical Examination:   BP (!) 147/87   Pulse 67   Temp (!) 97.3 F (36.3 C) (Temporal)   Ht 6\' 3"  (1.905 m)   Wt 209 lb 12.8 oz (95.2 kg)   BMI 26.22 kg/m   General: Well-nourished, well-developed in no acute distress.  Eyes: No icterus. Mouth: Oropharyngeal mucosa moist and pink , no lesions erythema  or exudate. Lungs: Clear to auscultation bilaterally.  Heart: Regular rate and rhythm, no murmurs rubs or gallops.  Abdomen: Bowel sounds are normal,   nondistended, no hepatosplenomegaly or masses, no abdominal bruits or hernia , no rebound or guarding.  Mid abd tenderness at and below umbilicus Rectal: patient declined Extremities: No lower extremity edema. No clubbing or deformities. Neuro: Alert and oriented x 4   Skin: Warm and dry, no jaundice.   Psych: Alert and cooperative, normal mood and affect.  Labs:  Labs from November 21, 2019: Glucose 83, BUN 15, creatinine 1.03, sodium 140, white blood cell count 7800, hemoglobin 16.4, platelets  226,000  Imaging Studies: No results found.  Impression:/Plan:  59 y/o male with remote h/o PUD, GERD presenting for ongoing intermittent brbpr and mid abdominal pain. Colonoscopy and upper endoscopy 12/2018 as outlined. Suspect rectal bleeding due to hemorrhoids and may benefit from further banding. He declined rectal exam today. He is concerned if he has an ulcer due to his prior history of PUD. He consumes 40 ounces of beer daily. He takes naprosyn several times per week. His abdominal pain in mid to lower abdomen which is not consistent with PUD.   Given one year h/o intermittent abdominal pain, plan for CT A/P with contrast and labs. He will resume daily omeprazole to provide GI protection from naprosyn but will also help if he has gastritis/pud. If he notices black stools, or worsening rectal bleeding he should go to the nearest ER.   Patient is aware of need to cut back on etoh use. He has been trying.

## 2019-12-05 NOTE — Patient Instructions (Addendum)
1. CT scan to evaluate abdominal pain.  2. Please start back taking omeprazole 20mg  daily before a meal to protect from stomach ulcers and it will start healing process if you have an ulcer.  3. If you notice black tarry stool or worsening bleeding, go to the nearest ER.

## 2019-12-05 NOTE — Telephone Encounter (Signed)
CT A/P scheduled for 8/31 at 8:00am, arrival 7:45am, npo 4 hrs prior, p/u oral contrast at least 1 day prior test.  Patient aware of appt details. He voiced understanding. Nothing further needed

## 2019-12-27 ENCOUNTER — Other Ambulatory Visit: Payer: Self-pay

## 2019-12-27 ENCOUNTER — Ambulatory Visit (HOSPITAL_COMMUNITY)
Admission: RE | Admit: 2019-12-27 | Discharge: 2019-12-27 | Disposition: A | Discharge status: Home / Self Care | Payer: Medicare Other | Source: Ambulatory Visit | Attending: Gastroenterology | Admitting: Gastroenterology

## 2019-12-27 DIAGNOSIS — R630 Anorexia: Secondary | ICD-10-CM | POA: Insufficient documentation

## 2019-12-27 DIAGNOSIS — R1033 Periumbilical pain: Secondary | ICD-10-CM | POA: Insufficient documentation

## 2019-12-27 DIAGNOSIS — K625 Hemorrhage of anus and rectum: Secondary | ICD-10-CM | POA: Diagnosis not present

## 2019-12-27 LAB — POCT I-STAT CREATININE: Creatinine, Ser: 1.3 mg/dL — ABNORMAL HIGH (ref 0.61–1.24)

## 2019-12-27 MED ORDER — IOHEXOL 300 MG/ML  SOLN
100.0000 mL | Freq: Once | INTRAMUSCULAR | Status: AC | PRN
  Administered 2019-12-27: 100 mL via INTRAVENOUS

## 2020-01-04 ENCOUNTER — Encounter: Payer: Self-pay | Admitting: Gastroenterology

## 2020-01-05 ENCOUNTER — Telehealth: Payer: Self-pay | Admitting: Internal Medicine

## 2020-01-05 NOTE — Telephone Encounter (Signed)
Pt returning call. [phone]

## 2020-01-05 NOTE — Telephone Encounter (Signed)
See result notes. 

## 2020-01-31 ENCOUNTER — Ambulatory Visit: Payer: Medicare Other | Admitting: Gastroenterology

## 2020-03-26 ENCOUNTER — Telehealth: Payer: Self-pay | Admitting: Gastroenterology

## 2020-03-26 NOTE — Telephone Encounter (Signed)
Patient needs omeprazole sent to [facility]

## 2020-03-26 NOTE — Telephone Encounter (Signed)
Routing to [facility] refill box. Omeprazole 20 mg daily.

## 2020-04-03 MED ORDER — OMEPRAZOLE 20 MG PO CPDR
DELAYED_RELEASE_CAPSULE | ORAL | 3 refills | Status: DC
Start: 2020-04-03 — End: 2020-08-13

## 2020-04-03 NOTE — Addendum Note (Signed)
Addended by: Gordy Levan, Hussain Maimone A on: 04/03/2020 09:07 PM   Modules accepted: Orders

## 2020-04-03 NOTE — Telephone Encounter (Signed)
Rx sent 

## 2020-04-04 ENCOUNTER — Ambulatory Visit: Payer: Medicare Other | Admitting: Gastroenterology

## 2020-05-18 ENCOUNTER — Ambulatory Visit: Payer: Medicare Other | Admitting: Gastroenterology

## 2020-06-27 ENCOUNTER — Ambulatory Visit: Payer: Medicare Other | Admitting: Gastroenterology

## 2020-06-27 ENCOUNTER — Encounter: Payer: Self-pay | Admitting: Internal Medicine

## 2020-07-23 DIAGNOSIS — M159 Polyosteoarthritis, unspecified: Secondary | ICD-10-CM | POA: Diagnosis not present

## 2020-07-23 DIAGNOSIS — R202 Paresthesia of skin: Secondary | ICD-10-CM | POA: Diagnosis not present

## 2020-07-23 DIAGNOSIS — I10 Essential (primary) hypertension: Secondary | ICD-10-CM | POA: Diagnosis not present

## 2020-07-23 DIAGNOSIS — Z720 Tobacco use: Secondary | ICD-10-CM | POA: Diagnosis not present

## 2020-07-23 DIAGNOSIS — E785 Hyperlipidemia, unspecified: Secondary | ICD-10-CM | POA: Diagnosis not present

## 2020-07-26 DIAGNOSIS — E7849 Other hyperlipidemia: Secondary | ICD-10-CM | POA: Diagnosis not present

## 2020-07-26 DIAGNOSIS — I10 Essential (primary) hypertension: Secondary | ICD-10-CM | POA: Diagnosis not present

## 2020-08-13 ENCOUNTER — Telehealth: Payer: Self-pay

## 2020-08-13 ENCOUNTER — Other Ambulatory Visit: Payer: Self-pay

## 2020-08-13 DIAGNOSIS — K279 Peptic ulcer, site unspecified, unspecified as acute or chronic, without hemorrhage or perforation: Secondary | ICD-10-CM

## 2020-08-13 DIAGNOSIS — K219 Gastro-esophageal reflux disease without esophagitis: Secondary | ICD-10-CM

## 2020-08-13 MED ORDER — OMEPRAZOLE 20 MG PO CPDR: DELAYED_RELEASE_CAPSULE | ORAL | 3 refills | Status: DC

## 2020-08-13 NOTE — Telephone Encounter (Signed)
[facility] left vm requesting refill for pt's Omeprazole 20 mg cap. 1 cap 30 minutes prior to breakfast/may take 1 before dinner if needed. Sent to [facility] Refill

## 2020-08-15 NOTE — Telephone Encounter (Signed)
Ok to fill. Appears to already be signed.

## 2020-08-25 DIAGNOSIS — I10 Essential (primary) hypertension: Secondary | ICD-10-CM | POA: Diagnosis not present

## 2020-08-25 DIAGNOSIS — E7849 Other hyperlipidemia: Secondary | ICD-10-CM | POA: Diagnosis not present

## 2020-09-15 DIAGNOSIS — S81811A Laceration without foreign body, right lower leg, initial encounter: Secondary | ICD-10-CM | POA: Diagnosis not present

## 2020-09-15 DIAGNOSIS — M7021 Olecranon bursitis, right elbow: Secondary | ICD-10-CM | POA: Diagnosis not present

## 2020-09-15 DIAGNOSIS — W19XXXA Unspecified fall, initial encounter: Secondary | ICD-10-CM | POA: Diagnosis not present

## 2020-09-15 DIAGNOSIS — M25521 Pain in right elbow: Secondary | ICD-10-CM | POA: Diagnosis not present

## 2020-09-15 DIAGNOSIS — L03115 Cellulitis of right lower limb: Secondary | ICD-10-CM | POA: Diagnosis not present

## 2020-09-15 DIAGNOSIS — L02415 Cutaneous abscess of right lower limb: Secondary | ICD-10-CM | POA: Diagnosis not present

## 2020-09-15 DIAGNOSIS — Z79899 Other long term (current) drug therapy: Secondary | ICD-10-CM | POA: Diagnosis not present

## 2020-10-23 DIAGNOSIS — R2231 Localized swelling, mass and lump, right upper limb: Secondary | ICD-10-CM | POA: Diagnosis not present

## 2020-10-23 DIAGNOSIS — I10 Essential (primary) hypertension: Secondary | ICD-10-CM | POA: Diagnosis not present

## 2020-10-23 DIAGNOSIS — L03115 Cellulitis of right lower limb: Secondary | ICD-10-CM | POA: Diagnosis not present

## 2020-10-31 ENCOUNTER — Ambulatory Visit (INDEPENDENT_AMBULATORY_CARE_PROVIDER_SITE_OTHER): Payer: Medicare Other | Admitting: Orthopedic Surgery

## 2020-10-31 ENCOUNTER — Encounter: Payer: Self-pay | Admitting: Orthopedic Surgery

## 2020-10-31 ENCOUNTER — Other Ambulatory Visit: Payer: Self-pay

## 2020-10-31 ENCOUNTER — Ambulatory Visit: Payer: Medicare Other

## 2020-10-31 VITALS — BP 179/89 | HR 59 | Ht 75.0 in | Wt 211.0 lb

## 2020-10-31 DIAGNOSIS — M25521 Pain in right elbow: Secondary | ICD-10-CM

## 2020-10-31 DIAGNOSIS — M7021 Olecranon bursitis, right elbow: Secondary | ICD-10-CM

## 2020-10-31 NOTE — Patient Instructions (Signed)
We discussed surgery for your elbow today.  It is very important that you quit smoking in order to improve your chances of healing the surgical incision.  We can discuss this further in another month.

## 2020-10-31 NOTE — Progress Notes (Signed)
New Patient Visit  Assessment: Stephen Koch is a 60 y.o. male with the following: Right olecranon bursitis, recurrent.  Underlying osteophyte  Plan: I had extensive discussion with the patient in clinic today regards to his right elbow.  He does have obvious swelling, consistent with olecranon bursitis.  He does have some point tenderness on physical exam.  He recently had the bursa drained, and the fluid has reaccumulated in less than 6 weeks.  We reviewed radiographs of his right elbow in clinic today, which demonstrates a large osteophyte, which is likely contributing to the reaccumulation of the fluid.  We discussed multiple options including continuing with his current treatments including compression medications as needed, he can wear a compressive sleeve with padding, which could prevent him from bumping the elbow causing pain.  We also discussed the possibility of surgical excision of both the bursa as well as the existing osteophyte.  At this point, he did express interest in proceeding with surgery.  However, he continues to smoke on a daily basis, and I stressed to him the importance of stopping use of nicotine in order to improve the likelihood of him having a successful outcome and healing the incision without issues.  He stated his understanding.  We will plan to see him back in approximately 1 month for repeat evaluation, and to discuss surgery and is smoking cessation at that time.   Follow-up: No follow-ups on file.  Subjective:  Chief Complaint  Patient presents with   Elbow Pain    Rt elbow swelling and pain for 2 months.    History of Present Illness: Stephen Koch is a 60 y.o. male who has been referred to clinic today by Iona Beard, MD for evaluation of right elbow pain.  He states he has had pain and swelling in the right elbow for the past 2 to 2 months.  He presented to the emergency department approximately 6 weeks ago, at which time he had the bursa drained.   A compressive wrap was placed on his elbow, and he states that upon removal of the wrap in a few days, the fluid had reaccumulated.  He will occasionally bump his elbow, which is painful.  He has difficulty resting his elbow in certain positions, as this causes pain.  He has not noticed any redness, warmth or any drainage from the elbow.  No specific injury.  He works as a Curator.   Review of Systems: No fevers or chills No numbness or tingling No chest pain No shortness of breath No bowel or bladder dysfunction No GI distress No headaches   Medical History:  Past Medical History:  Diagnosis Date   Arthritis    Chronic back pain    Forklift injury   Essential hypertension, benign    GERD (gastroesophageal reflux disease)    History of medication noncompliance     Past Surgical History:  Procedure Laterality Date   BIOPSY  01/04/2019   Procedure: BIOPSY;  Surgeon: Danie Binder, MD;  Location: [facility];  Service: Endoscopy;;  gastric   COLONOSCOPY N/A 07/06/2012   FOY:DXAJOI mucosa in the terminal ileum/Moderate sized internal hemorrhoids   COLONOSCOPY WITH PROPOFOL N/A 01/04/2019   normal TI, internal hemorrhoids Grade 3 s/p band placement X 3.    ESOPHAGOGASTRODUODENOSCOPY N/A 04/08/2013   NOM:VEHMC gastric ulcer/duodenal inflammation. bx with chronic active gastritis with focal intestinal metaplasia, ulceration and H pylori   ESOPHAGOGASTRODUODENOSCOPY (EGD) WITH PROPOFOL N/A 01/04/2019   moderate gastritis s/p  biopsy, negative Hpylori   HEMORRHOID BANDING N/A 01/04/2019   Procedure: HEMORRHOID BANDING;  Surgeon: Danie Binder, MD;  Location: [facility];  Service: Endoscopy;  Laterality: N/A;   Left knee surgery Left    arthroscopy   QUADRICEPS TENDON REPAIR Left 03/05/2017   Procedure: REPAIR QUADRICEP TENDON;  Surgeon: Carole Civil, MD;  Location: [facility];  Service: Orthopedics;  Laterality: Left;   Right arm surgery     tendon repair    Family History   Problem Relation Age of Onset   Heart disease Father    Colon cancer Neg Hx    Colon polyps Neg Hx    Social History   Tobacco Use   Smoking status: Every Day    Packs/day: 0.25    Years: 40.00    Pack years: 10.00    Types: Cigarettes   Smokeless tobacco: Never   Tobacco comments:    smokes about 6-7 per day   Vaping Use   Vaping Use: Never used  Substance Use Topics   Alcohol use: Yes    Alcohol/week: 0.0 standard drinks    Comment: beer 40oz daily   Drug use: Yes    Types: Marijuana    Comment: Marijuana 3 times per week     Allergies  Allergen Reactions   Ibuprofen Other (See Comments)    Causes gastric bleeding    Current Meds  Medication Sig   amLODipine (NORVASC) 5 MG tablet Take 1 tablet (5 mg total) by mouth daily.   atorvastatin (LIPITOR) 20 MG tablet Take 20 mg by mouth at bedtime.   gabapentin (NEURONTIN) 300 MG capsule Take 300 mg by mouth 2 (two) times daily.    methocarbamol (ROBAXIN) 500 MG tablet Take 500 mg by mouth 2 (two) times daily.   Multiple Vitamin (MULTIVITAMIN WITH MINERALS) TABS tablet Take 1 tablet by mouth daily.   naproxen sodium (ALEVE) 220 MG tablet Take 660 mg by mouth 2 (two) times daily as needed (PAIN.).   omeprazole (PRILOSEC) 20 MG capsule 1 PO 30 MINS PRIOR TO BREAKFAST and may take 1 before dinner if needed.    Objective: BP (!) 179/89   Pulse (!) 59   Ht 6\' 3"  (1.905 m)   Wt 211 lb (95.7 kg)   BMI 26.37 kg/m   Physical Exam:  General: Alert and oriented. and No acute distress. Gait: Normal gait.  Evaluation of right arm demonstrates no obvious injuries.  He does have a prominent swelling, directly over the olecranon.  The midpoint of the swelling, he can palpate bone, which is tender to deep palpation.  He has full flexion and extension of his elbow.  Full pronation and supination.  Fingers warm and well-perfused.  Sensation is intact distally.  The swelling over the elbow is soft and compressible.  No surrounding  warmth.  No drainage.  There is a small, laterally based incision, which is healed well.  IMAGING: I personally ordered and reviewed the following images  X-rays of the right elbow were obtained in clinic today and demonstrates no acute injury.  Elbow joint is reduced, with well-maintained joint space.  No significant osteophytes are noted around the elbow joint.  He does have a pointed osteophyte projecting proximally from the most prominent aspect of the olecranon.  Soft tissue shadows identified.  Impression: Normal right elbow x-ray, with osteophyte projecting off of the olecranon.   New Medications:  No orders of the defined types were placed in this encounter.  Mordecai Rasmussen, MD  10/31/2020 10:36 AM

## 2020-11-25 DIAGNOSIS — I10 Essential (primary) hypertension: Secondary | ICD-10-CM | POA: Diagnosis not present

## 2020-11-25 DIAGNOSIS — E7849 Other hyperlipidemia: Secondary | ICD-10-CM | POA: Diagnosis not present

## 2020-11-25 DIAGNOSIS — Z720 Tobacco use: Secondary | ICD-10-CM | POA: Diagnosis not present

## 2020-11-27 DIAGNOSIS — E785 Hyperlipidemia, unspecified: Secondary | ICD-10-CM | POA: Diagnosis not present

## 2020-11-27 DIAGNOSIS — M7021 Olecranon bursitis, right elbow: Secondary | ICD-10-CM | POA: Diagnosis not present

## 2020-11-27 DIAGNOSIS — R2231 Localized swelling, mass and lump, right upper limb: Secondary | ICD-10-CM | POA: Diagnosis not present

## 2020-11-27 DIAGNOSIS — Z720 Tobacco use: Secondary | ICD-10-CM | POA: Diagnosis not present

## 2020-11-27 DIAGNOSIS — I10 Essential (primary) hypertension: Secondary | ICD-10-CM | POA: Diagnosis not present

## 2020-11-28 ENCOUNTER — Encounter: Payer: Self-pay | Admitting: Orthopedic Surgery

## 2020-11-28 ENCOUNTER — Other Ambulatory Visit: Payer: Self-pay

## 2020-11-28 ENCOUNTER — Ambulatory Visit (INDEPENDENT_AMBULATORY_CARE_PROVIDER_SITE_OTHER): Payer: Medicare Other | Admitting: Orthopedic Surgery

## 2020-11-28 VITALS — BP 163/79 | HR 60 | Ht 75.0 in | Wt 213.4 lb

## 2020-11-28 DIAGNOSIS — M7021 Olecranon bursitis, right elbow: Secondary | ICD-10-CM | POA: Diagnosis not present

## 2020-11-28 NOTE — Progress Notes (Signed)
Orthopaedic Clinic Return  Assessment: Stephen Koch is a 60 y.o. male with the following: Right olecranon bursitis; underlying osteophyte  Plan: Patient is interested in undergoing surgery.  However, he has not yet committed to not smoking.  He states he is cut back since the last visit.  In addition, he states he will feel more comfortable if he has gone 2-3 months without smoking.  He will continue to wear the brace and compression sleeve as needed.  He will contact the clinic when he is ready to schedule a follow-up visit, and further discussion regarding surgery.   Follow-up: Return if symptoms worsen or fail to improve.   Subjective:  Chief Complaint  Patient presents with   Elbow Pain    Right elbow/4 week follow up/pt says pain is about the same/6-7/10 on pain scale    History of Present Illness: Stephen Koch is a 60 y.o. male who returns to clinic for repeat evaluation of right elbow pain.  He has olecranon bursitis.  It has been there for a while.  He has had an aspirated multiple times.  The last time was aspirated in the emergency department, the swelling returned the following day.  He is interested in pursuing surgery, we discussed the importance of him stopping smoking, improve his chances of healing the incision following surgery.  He states he is cut back, but does continue to smoke.  He primarily smokes small cigars.  Review of Systems: No fevers or chills No numbness or tingling No chest pain No shortness of breath No bowel or bladder dysfunction No GI distress No headaches   Objective: BP (!) 163/79   Pulse 60   Ht '6\' 3"'$  (1.905 m)   Wt 213 lb 6.4 oz (96.8 kg)   BMI 26.67 kg/m   Physical Exam:  Alert and oriented.  No acute distress.  Normal gait  Evaluation of the right elbow demonstrates swelling directly over the olecranon.  There is no surrounding erythema.  No fluctuance is appreciated.  The osteophyte demonstrated on the x-ray is  palpable.  He has full range of motion otherwise.  Fingers are warm and well-perfused.  Sensation is intact throughout the hand.  IMAGING: I personally ordered and reviewed the following images:  No new x-rays obtained today.  Stephen Rasmussen, MD 11/28/2020 10:22 PM

## 2021-02-25 DIAGNOSIS — E7849 Other hyperlipidemia: Secondary | ICD-10-CM | POA: Diagnosis not present

## 2021-02-25 DIAGNOSIS — I10 Essential (primary) hypertension: Secondary | ICD-10-CM | POA: Diagnosis not present

## 2021-02-25 DIAGNOSIS — Z720 Tobacco use: Secondary | ICD-10-CM | POA: Diagnosis not present

## 2021-02-26 DIAGNOSIS — E785 Hyperlipidemia, unspecified: Secondary | ICD-10-CM | POA: Diagnosis not present

## 2021-02-26 DIAGNOSIS — Z0000 Encounter for general adult medical examination without abnormal findings: Secondary | ICD-10-CM | POA: Diagnosis not present

## 2021-02-26 DIAGNOSIS — M159 Polyosteoarthritis, unspecified: Secondary | ICD-10-CM | POA: Diagnosis not present

## 2021-02-26 DIAGNOSIS — Z720 Tobacco use: Secondary | ICD-10-CM | POA: Diagnosis not present

## 2021-02-26 DIAGNOSIS — M19012 Primary osteoarthritis, left shoulder: Secondary | ICD-10-CM | POA: Diagnosis not present

## 2021-02-26 DIAGNOSIS — I10 Essential (primary) hypertension: Secondary | ICD-10-CM | POA: Diagnosis not present

## 2021-02-26 DIAGNOSIS — M19011 Primary osteoarthritis, right shoulder: Secondary | ICD-10-CM | POA: Diagnosis not present

## 2021-02-26 DIAGNOSIS — M170 Bilateral primary osteoarthritis of knee: Secondary | ICD-10-CM | POA: Diagnosis not present

## 2021-05-26 DIAGNOSIS — E785 Hyperlipidemia, unspecified: Secondary | ICD-10-CM | POA: Diagnosis not present

## 2021-05-26 DIAGNOSIS — I10 Essential (primary) hypertension: Secondary | ICD-10-CM | POA: Diagnosis not present

## 2021-05-26 DIAGNOSIS — Z720 Tobacco use: Secondary | ICD-10-CM | POA: Diagnosis not present

## 2021-05-26 DIAGNOSIS — E7849 Other hyperlipidemia: Secondary | ICD-10-CM | POA: Diagnosis not present

## 2021-06-04 DIAGNOSIS — Z720 Tobacco use: Secondary | ICD-10-CM | POA: Diagnosis not present

## 2021-06-04 DIAGNOSIS — M19011 Primary osteoarthritis, right shoulder: Secondary | ICD-10-CM | POA: Diagnosis not present

## 2021-06-04 DIAGNOSIS — K642 Third degree hemorrhoids: Secondary | ICD-10-CM | POA: Diagnosis not present

## 2021-06-04 DIAGNOSIS — E785 Hyperlipidemia, unspecified: Secondary | ICD-10-CM | POA: Diagnosis not present

## 2021-06-04 DIAGNOSIS — M19012 Primary osteoarthritis, left shoulder: Secondary | ICD-10-CM | POA: Diagnosis not present

## 2021-06-04 DIAGNOSIS — I10 Essential (primary) hypertension: Secondary | ICD-10-CM | POA: Diagnosis not present

## 2021-07-26 DIAGNOSIS — Z720 Tobacco use: Secondary | ICD-10-CM | POA: Diagnosis not present

## 2021-07-26 DIAGNOSIS — E7849 Other hyperlipidemia: Secondary | ICD-10-CM | POA: Diagnosis not present

## 2021-07-26 DIAGNOSIS — I10 Essential (primary) hypertension: Secondary | ICD-10-CM | POA: Diagnosis not present

## 2021-07-26 DIAGNOSIS — E785 Hyperlipidemia, unspecified: Secondary | ICD-10-CM | POA: Diagnosis not present

## 2021-07-31 ENCOUNTER — Ambulatory Visit: Payer: Medicare Other | Admitting: Internal Medicine

## 2021-08-13 ENCOUNTER — Ambulatory Visit (INDEPENDENT_AMBULATORY_CARE_PROVIDER_SITE_OTHER): Payer: Medicare Other | Admitting: Internal Medicine

## 2021-08-13 ENCOUNTER — Encounter: Payer: Self-pay | Admitting: Internal Medicine

## 2021-08-13 VITALS — BP 138/70 | HR 73 | Temp 97.1°F | Ht 75.0 in | Wt 209.2 lb

## 2021-08-13 DIAGNOSIS — K625 Hemorrhage of anus and rectum: Secondary | ICD-10-CM

## 2021-08-13 NOTE — Progress Notes (Signed)
? ? ?Primary Care Physician:  Iona Beard, MD ?Primary Gastroenterologist:  Dr.Renie Stelmach ? ?Pre-Procedure History & Physical: ?HPI:  Stephen Koch is a 61 y.o. male here for further evaluation of rectal bleeding.  States she has about 2 bowel movements weekly.  Does not eat well.  Eats meat all the time no vegetables or fruit.  Drinks multiple 12 ounce cans of beer daily.  Does not drink any water.  Has about 2 bowel movements weekly.  Often times has to strain.  Spends 10 to 15 minutes on the commode attempting to have a bowel movement.  Notes blood on the toilet paper.  Some bright from time to time. ? ?Evaluated in 2020 by Dr. Oneida Alar.  Patient underwent colonoscopy with hemorrhoid banding at the time of the procedure.  He states that he continued to have some rectal bleeding at that time although it improved considerably until the last couple of weeks. ? ?Aside from hemorrhoids no other significant lesions were reported at the time of colonoscopy nearly 3 years ago. ? ?Past Medical History:  ?Diagnosis Date  ? Arthritis   ? Chronic back pain   ? Forklift injury  ? Essential hypertension, benign   ? GERD (gastroesophageal reflux disease)   ? History of medication noncompliance   ? ? ?Past Surgical History:  ?Procedure Laterality Date  ? BIOPSY  01/04/2019  ? Procedure: BIOPSY;  Surgeon: Danie Binder, MD;  [facility];  Service: Endoscopy;;  gastric  ? COLONOSCOPY N/A 07/06/2012  ? HAL:PFXTKW mucosa in the terminal ileum/Moderate sized internal hemorrhoids  ? COLONOSCOPY WITH PROPOFOL N/A 01/04/2019  ? normal TI, internal hemorrhoids Grade 3 s/p band placement X 3.   ? ESOPHAGOGASTRODUODENOSCOPY N/A 04/08/2013  ? IOX:BDZHG gastric ulcer/duodenal inflammation. bx with chronic active gastritis with focal intestinal metaplasia, ulceration and H pylori  ? ESOPHAGOGASTRODUODENOSCOPY (EGD) WITH PROPOFOL N/A 01/04/2019  ? moderate gastritis s/p biopsy, negative Hpylori  ? HEMORRHOID BANDING N/A 01/04/2019  ?  Procedure: HEMORRHOID BANDING;  Surgeon: Danie Binder, MD;  [facility];  Service: Endoscopy;  Laterality: N/A;  ? Left knee surgery Left   ? arthroscopy  ? QUADRICEPS TENDON REPAIR Left 03/05/2017  ? Procedure: REPAIR QUADRICEP TENDON;  Surgeon: Carole Civil, MD;  Location: [facility];  Service: Orthopedics;  Laterality: Left;  ? Right arm surgery    ? tendon repair  ? ? ?Prior to Admission medications   ?Medication Sig Start Date End Date Taking? Authorizing Provider  ?amLODipine (NORVASC) 5 MG tablet Take 1 tablet (5 mg total) by mouth daily. 10/20/18  Yes Annitta Needs, NP  ?atorvastatin (LIPITOR) 20 MG tablet Take 20 mg by mouth at bedtime. 04/14/19  Yes [provider]  ?gabapentin (NEURONTIN) 300 MG capsule Take 300 mg by mouth 2 (two) times daily.  05/11/19  Yes [provider]  ?methocarbamol (ROBAXIN) 500 MG tablet Take 500 mg by mouth 2 (two) times daily. 04/14/19  Yes [provider]  ?Multiple Vitamin (MULTIVITAMIN WITH MINERALS) TABS tablet Take 1 tablet by mouth daily.   Yes [provider]  ?omeprazole (PRILOSEC) 20 MG capsule 1 PO 30 MINS PRIOR TO BREAKFAST and may take 1 before dinner if needed. 08/13/20  Yes Carlis Stable, NP  ?naproxen sodium (ALEVE) 220 MG tablet Take 660 mg by mouth 2 (two) times daily as needed (PAIN.). ?Patient not taking: Reported on 08/13/2021    [provider]  ? ? ?Allergies as of 08/13/2021 -  Review Complete 08/13/2021  ?Allergen Reaction Noted  ? Ibuprofen Other (See Comments) 04/06/2015  ? ? ?Family History  ?Problem Relation Age of Onset  ? Heart disease Father   ? Colon cancer Neg Hx   ? Colon polyps Neg Hx   ? ? ?Social History  ? ?Socioeconomic History  ? Marital status: Married  ?  Spouse name: Not on file  ? Number of children: 4  ? Years of education: Not on file  ? Highest education level: Not on file  ?Occupational History  ? Occupation: Unemployed; previously Proofreader work  ?Tobacco Use  ? Smoking  status: Every Day  ?  Types: Cigars  ? Smokeless tobacco: Never  ? Tobacco comments:  ?  smokes about 6-7 black and milds per day   ?Vaping Use  ? Vaping Use: Never used  ?Substance and Sexual Activity  ? Alcohol use: Yes  ?  Alcohol/week: 10.0 standard drinks  ?  Types: 10 Cans of beer per week  ? Drug use: Yes  ?  Types: Marijuana  ?  Comment: Marijuana 2 times per week  ? Sexual activity: Yes  ?  Birth control/protection: None  ?Other Topics Concern  ? Not on file  ?Social History Narrative  ? Lives w/ grandma  ? ?Social Determinants of Health  ? ?Financial Resource Strain: Not on file  ?Food Insecurity: Not on file  ?Transportation Needs: Not on file  ?Physical Activity: Not on file  ?Stress: Not on file  ?Social Connections: Not on file  ?Intimate Partner Violence: Not on file  ? ? ?Review of Systems: ?See HPI, otherwise negative ROS ? ?Physical Exam: ?BP 138/70 (BP Location: Right Arm, Patient Position: Sitting, Cuff Size: Normal)   Pulse 73   Temp (!) 97.1 ?F (36.2 ?C) (Temporal)   Ht '6\' 3"'$  (1.905 m)   Wt 209 lb 3.2 oz (94.9 kg)   SpO2 100%   BMI 26.15 kg/m?  ?General:   Alert,   well-nourished, pleasant and cooperative in NAD ?Neck:  Supple; no masses or thyromegaly. No significant cervical adenopathy. ?Lungs:  Clear throughout to auscultation.   No wheezes, crackles, or rhonchi. No acute distress. ?Heart:  Regular rate and rhythm; no murmurs, clicks, rubs,  or gallops. ?Abdomen: Non-distended, normal bowel sounds.  Soft and nontender without appreciable mass or hepatosplenomegaly.  ?Pulses:  Normal pulses noted. ?Extremities:  Without clubbing or edema. ?Rectal: Adamantly declined by the patient "states it would make a mess". ? ? ?Impression/Plan: 61 year old gentleman with paper hematochezia in the setting of known hemorrhoids.  Colonoscopy about 2-1/2 years ago as outlined above.  He did have grade 3 hemorrhoids at that time. ?I suspect patient has benign bleeding from anorectal source.  Poor  dietary habits including alcohol ingestion may be contributing factors.  We discussed the spectrum of treatment for constipation.  We also talked about the fact the rectal bleeding may need to be investigated further in the near future. ? ?Recommendations: ? ?Hopefully, treating your constipation will alleviate your rectal bleeding.  If it does not, you may need further evaluation with a repeat scope examination ? ?Please try to drink more water throughout the day ? ?Begin Benefiber 2 tablespoons in 16 ounces of water each morning (it is important to take at least this amount of water with the Benefiber).  Take Benefiber every day ? ?Cutting back on your beer consumption will also help ? ?Avoid straining with a bowel movement.  If you do not have a bowel  movement within 5 minutes of sitting on the toilet get up and go do something else and then come back ? ?Call me in 3 weeks and let me know how you are doing.  Keep a stool diary on an extra calendar ? ?Constipation information provided ? ?Office visit here in 8 weeks. ? ? ? ? ? ? ?Notice: This dictation was prepared with Dragon dictation along with smaller phrase technology. Any transcriptional errors that result from this process are unintentional and may not be corrected upon review.  ?

## 2021-08-13 NOTE — Patient Instructions (Signed)
It was good to see you today! ? ?Hopefully, treating your constipation will alleviate your rectal bleeding.  If it does not, you may need further evaluation with a repeat scope examination ? ?Please try to drink more water throughout the day ? ?Begin Benefiber 2 tablespoons in 16 ounces of water each morning (it is important to take at least this amount of water with the Benefiber).  Take Benefiber every day ? ?Cutting back on your beer consumption will also help ? ?Avoid straining with a bowel movement.  If you do not have a bowel movement within 5 minutes of sitting on the toilet get up and go do something else and then come back ? ?Call me in 3 weeks and let me know how you are doing.  Keep a stool diary on an extra calendar ? ?Constipation information provided ? ?Office visit here in 8 weeks. ?

## 2021-09-25 DIAGNOSIS — I10 Essential (primary) hypertension: Secondary | ICD-10-CM | POA: Diagnosis not present

## 2021-09-25 DIAGNOSIS — E7849 Other hyperlipidemia: Secondary | ICD-10-CM | POA: Diagnosis not present

## 2021-10-16 ENCOUNTER — Ambulatory Visit: Payer: Medicare Other | Admitting: Gastroenterology

## 2021-10-31 ENCOUNTER — Other Ambulatory Visit: Payer: Self-pay | Admitting: Nurse Practitioner

## 2021-10-31 DIAGNOSIS — K279 Peptic ulcer, site unspecified, unspecified as acute or chronic, without hemorrhage or perforation: Secondary | ICD-10-CM

## 2021-10-31 DIAGNOSIS — K219 Gastro-esophageal reflux disease without esophagitis: Secondary | ICD-10-CM

## 2021-11-05 ENCOUNTER — Encounter: Payer: Self-pay | Admitting: Internal Medicine

## 2021-11-25 DIAGNOSIS — E7849 Other hyperlipidemia: Secondary | ICD-10-CM | POA: Diagnosis not present

## 2021-11-25 DIAGNOSIS — E785 Hyperlipidemia, unspecified: Secondary | ICD-10-CM | POA: Diagnosis not present

## 2021-11-25 DIAGNOSIS — I10 Essential (primary) hypertension: Secondary | ICD-10-CM | POA: Diagnosis not present

## 2021-12-05 ENCOUNTER — Ambulatory Visit: Payer: Medicare Other | Admitting: Internal Medicine

## 2021-12-10 ENCOUNTER — Ambulatory Visit: Payer: Medicare Other | Admitting: Gastroenterology

## 2021-12-27 ENCOUNTER — Ambulatory Visit (INDEPENDENT_AMBULATORY_CARE_PROVIDER_SITE_OTHER): Payer: Medicare Other | Admitting: Gastroenterology

## 2021-12-27 ENCOUNTER — Encounter: Payer: Self-pay | Admitting: Gastroenterology

## 2021-12-27 VITALS — BP 160/80 | HR 62 | Temp 97.7°F | Ht 75.0 in | Wt 211.2 lb

## 2021-12-27 DIAGNOSIS — K921 Melena: Secondary | ICD-10-CM | POA: Diagnosis not present

## 2021-12-27 DIAGNOSIS — K5900 Constipation, unspecified: Secondary | ICD-10-CM

## 2021-12-27 NOTE — Patient Instructions (Addendum)
If you note recurrent constipation, infrequent stools, straining to have a bowel movement, please start Benefiber two teaspoons daily.  Please discuss with Dr. Berdine Addison regarding your heart murmur. You may benefit from updated ECHO (ultrasound of your heart).  Return to the office in one year or call sooner if needed.

## 2021-12-27 NOTE — Progress Notes (Signed)
GI Office Note    Referring Provider: Iona Beard, MD Primary Care Physician:  Iona Beard, MD  Primary Gastroenterologist: Garfield Cornea, MD   Chief Complaint   Chief Complaint  Patient presents with   Follow-up    Doing well.    History of Present Illness   Stephen Koch is a 61 y.o. male presenting today for follow-up of constipation and rectal bleeding.  Last seen in April 2023. History of peptic ulcer disease in the remote past, EGD and colonoscopy September 2020 with moderate gastritis, grade 3 hemorrhoids status post banding of all 3 columns.  Patient also has a history of GERD.   At last office visit, he was advised to take fiber daily.  Patient states he took Epsom salt and got cleaned out.  Since then his bowel movements have improved.  No longer having issues with constipation or straining.  Bowel movements more frequently.  Denies any rectal bleeding.  No abdominal pain.  No heartburn.   Medications   Current Outpatient Medications  Medication Sig Dispense Refill   amLODipine (NORVASC) 5 MG tablet Take 1 tablet (5 mg total) by mouth daily. 30 tablet 1   atorvastatin (LIPITOR) 20 MG tablet Take 20 mg by mouth at bedtime.     gabapentin (NEURONTIN) 300 MG capsule Take 300 mg by mouth 2 (two) times daily.      methocarbamol (ROBAXIN) 500 MG tablet Take 500 mg by mouth 2 (two) times daily.     Multiple Vitamin (MULTIVITAMIN WITH MINERALS) TABS tablet Take 1 tablet by mouth daily.     omeprazole (PRILOSEC) 20 MG capsule TAKE ONE CAPSULE BY MOUTH BEFORE BREAKFAST daily, can take second capsule before supper if needed. 180 capsule 3   tamsulosin (FLOMAX) 0.4 MG CAPS capsule Take 0.4 mg by mouth daily.     No current facility-administered medications for this visit.    Allergies   Allergies as of 12/27/2021 - Review Complete 12/27/2021  Allergen Reaction Noted   Ibuprofen Other (See Comments) 04/06/2015       Review of Systems   General: Negative for  anorexia, weight loss, fever, chills, fatigue, weakness. ENT: Negative for hoarseness, difficulty swallowing , nasal congestion. CV: Negative for chest pain, angina, palpitations, dyspnea on exertion, peripheral edema.  Respiratory: Negative for dyspnea at rest, dyspnea on exertion, cough, sputum, wheezing.  GI: See history of present illness. GU:  Negative for dysuria, hematuria, urinary incontinence, urinary frequency, nocturnal urination.  Endo: Negative for unusual weight change.     Physical Exam   BP (!) 160/80 Comment: manual  Pulse 62   Temp 97.7 F (36.5 C) (Temporal)   Ht '6\' 3"'$  (1.905 m)   Wt 211 lb 3.2 oz (95.8 kg)   SpO2 95%   BMI 26.40 kg/m    General: Well-nourished, well-developed in no acute distress.  Eyes: No icterus. Mouth: Oropharyngeal mucosa moist and pink , no lesions erythema or exudate. Lungs: Clear to auscultation bilaterally.  Heart: Regular rate and rhythm, no rubs or gallops. 4/6 SEM Abdomen: Bowel sounds are normal, nontender, nondistended, no hepatosplenomegaly or masses,  no abdominal bruits or hernia , no rebound or guarding.  Rectal: not performed Extremities: No lower extremity edema. No clubbing or deformities. Neuro: Alert and oriented x 4   Skin: Warm and dry, no jaundice.   Psych: Alert and cooperative, normal mood and affect.  Labs   None available  Imaging Studies   No results found.  Assessment  Rectal bleeding: No further episodes since bowel movements are more regular.  No longer straining.  Bleeding was likely hemorrhoidal related.  History of grade 3 hemorrhoids at time of colonoscopy just over 2 years ago.  Never started fiber and he does not feel like he needs to at this point.  Heart murmur: Patient noted to have loud heart murmur on exam today.  He states he has had a heart murmur as long as he can member.  Echocardiogram in 2015 with moderate LVH, mildly stenotic aortic valve, moderate aortic regurgitation. Was supposed  to follow up with cardiology in one year. Patient states he has not followed up only with his PCP.  PLAN   Recommend adding Benefiber 2 teaspoons daily if he has recurrent constipation, infrequent stools, straining. Encourage him to discuss his heart murmur with Dr. Berdine Addison. Sounds like he is overdue for cardiology follow up. He likely needs updated ECHO. Patient voiced understanding.  Return here in one year or sooner if needed.    Laureen Ochs. Bobby Rumpf, [place], [facility]

## 2022-01-01 ENCOUNTER — Ambulatory Visit: Payer: Medicare Other | Admitting: Internal Medicine

## 2022-01-25 DIAGNOSIS — I10 Essential (primary) hypertension: Secondary | ICD-10-CM | POA: Diagnosis not present

## 2022-01-25 DIAGNOSIS — E7849 Other hyperlipidemia: Secondary | ICD-10-CM | POA: Diagnosis not present

## 2022-02-17 DIAGNOSIS — I700 Atherosclerosis of aorta: Secondary | ICD-10-CM | POA: Diagnosis not present

## 2022-02-17 DIAGNOSIS — I10 Essential (primary) hypertension: Secondary | ICD-10-CM | POA: Diagnosis not present

## 2022-02-17 DIAGNOSIS — M150 Primary generalized (osteo)arthritis: Secondary | ICD-10-CM | POA: Diagnosis not present

## 2022-02-17 DIAGNOSIS — E785 Hyperlipidemia, unspecified: Secondary | ICD-10-CM | POA: Diagnosis not present

## 2022-02-17 DIAGNOSIS — Z720 Tobacco use: Secondary | ICD-10-CM | POA: Diagnosis not present

## 2022-02-17 DIAGNOSIS — M545 Low back pain, unspecified: Secondary | ICD-10-CM | POA: Diagnosis not present

## 2022-02-17 DIAGNOSIS — G5793 Unspecified mononeuropathy of bilateral lower limbs: Secondary | ICD-10-CM | POA: Diagnosis not present

## 2022-02-25 DIAGNOSIS — E7849 Other hyperlipidemia: Secondary | ICD-10-CM | POA: Diagnosis not present

## 2022-02-25 DIAGNOSIS — I10 Essential (primary) hypertension: Secondary | ICD-10-CM | POA: Diagnosis not present

## 2022-02-25 DIAGNOSIS — E785 Hyperlipidemia, unspecified: Secondary | ICD-10-CM | POA: Diagnosis not present

## 2022-04-26 DIAGNOSIS — I10 Essential (primary) hypertension: Secondary | ICD-10-CM | POA: Diagnosis not present

## 2022-04-26 DIAGNOSIS — E785 Hyperlipidemia, unspecified: Secondary | ICD-10-CM | POA: Diagnosis not present

## 2022-04-26 DIAGNOSIS — E7849 Other hyperlipidemia: Secondary | ICD-10-CM | POA: Diagnosis not present

## 2022-05-27 DIAGNOSIS — E7849 Other hyperlipidemia: Secondary | ICD-10-CM | POA: Diagnosis not present

## 2022-05-27 DIAGNOSIS — E785 Hyperlipidemia, unspecified: Secondary | ICD-10-CM | POA: Diagnosis not present

## 2022-05-27 DIAGNOSIS — I10 Essential (primary) hypertension: Secondary | ICD-10-CM | POA: Diagnosis not present

## 2022-06-26 DIAGNOSIS — E7849 Other hyperlipidemia: Secondary | ICD-10-CM | POA: Diagnosis not present

## 2022-06-26 DIAGNOSIS — I10 Essential (primary) hypertension: Secondary | ICD-10-CM | POA: Diagnosis not present

## 2022-06-26 DIAGNOSIS — E785 Hyperlipidemia, unspecified: Secondary | ICD-10-CM | POA: Diagnosis not present

## 2022-07-07 DIAGNOSIS — Z720 Tobacco use: Secondary | ICD-10-CM | POA: Diagnosis not present

## 2022-07-07 DIAGNOSIS — I10 Essential (primary) hypertension: Secondary | ICD-10-CM | POA: Diagnosis not present

## 2022-07-07 DIAGNOSIS — E7800 Pure hypercholesterolemia, unspecified: Secondary | ICD-10-CM | POA: Diagnosis not present

## 2022-07-07 DIAGNOSIS — M150 Primary generalized (osteo)arthritis: Secondary | ICD-10-CM | POA: Diagnosis not present

## 2022-07-07 DIAGNOSIS — I700 Atherosclerosis of aorta: Secondary | ICD-10-CM | POA: Diagnosis not present

## 2022-07-07 DIAGNOSIS — G5793 Unspecified mononeuropathy of bilateral lower limbs: Secondary | ICD-10-CM | POA: Diagnosis not present

## 2022-07-08 ENCOUNTER — Other Ambulatory Visit (HOSPITAL_COMMUNITY): Payer: Self-pay | Admitting: Family Medicine

## 2022-07-08 DIAGNOSIS — Z720 Tobacco use: Secondary | ICD-10-CM

## 2022-07-08 DIAGNOSIS — Z87891 Personal history of nicotine dependence: Secondary | ICD-10-CM

## 2022-07-23 ENCOUNTER — Ambulatory Visit (HOSPITAL_COMMUNITY)
Admission: RE | Admit: 2022-07-23 | Discharge: 2022-07-23 | Disposition: A | Discharge status: Home / Self Care | Payer: 59 | Source: Ambulatory Visit | Attending: Family Medicine | Admitting: Family Medicine

## 2022-07-23 DIAGNOSIS — Z87891 Personal history of nicotine dependence: Secondary | ICD-10-CM

## 2022-07-23 DIAGNOSIS — Z720 Tobacco use: Secondary | ICD-10-CM

## 2022-07-23 DIAGNOSIS — F17210 Nicotine dependence, cigarettes, uncomplicated: Secondary | ICD-10-CM | POA: Diagnosis not present

## 2022-08-26 DIAGNOSIS — E7849 Other hyperlipidemia: Secondary | ICD-10-CM | POA: Diagnosis not present

## 2022-08-26 DIAGNOSIS — I10 Essential (primary) hypertension: Secondary | ICD-10-CM | POA: Diagnosis not present

## 2022-08-26 DIAGNOSIS — E785 Hyperlipidemia, unspecified: Secondary | ICD-10-CM | POA: Diagnosis not present

## 2022-09-26 DIAGNOSIS — I10 Essential (primary) hypertension: Secondary | ICD-10-CM | POA: Diagnosis not present

## 2022-09-26 DIAGNOSIS — E782 Mixed hyperlipidemia: Secondary | ICD-10-CM | POA: Diagnosis not present

## 2022-10-07 DIAGNOSIS — J432 Centrilobular emphysema: Secondary | ICD-10-CM | POA: Diagnosis not present

## 2022-10-07 DIAGNOSIS — E7800 Pure hypercholesterolemia, unspecified: Secondary | ICD-10-CM | POA: Diagnosis not present

## 2022-10-07 DIAGNOSIS — I700 Atherosclerosis of aorta: Secondary | ICD-10-CM | POA: Diagnosis not present

## 2022-10-07 DIAGNOSIS — I10 Essential (primary) hypertension: Secondary | ICD-10-CM | POA: Diagnosis not present

## 2022-10-07 DIAGNOSIS — E785 Hyperlipidemia, unspecified: Secondary | ICD-10-CM | POA: Diagnosis not present

## 2022-10-07 DIAGNOSIS — E1169 Type 2 diabetes mellitus with other specified complication: Secondary | ICD-10-CM | POA: Diagnosis not present

## 2022-10-07 DIAGNOSIS — M150 Primary generalized (osteo)arthritis: Secondary | ICD-10-CM | POA: Diagnosis not present

## 2022-10-07 DIAGNOSIS — G5793 Unspecified mononeuropathy of bilateral lower limbs: Secondary | ICD-10-CM | POA: Diagnosis not present

## 2022-10-07 DIAGNOSIS — Z720 Tobacco use: Secondary | ICD-10-CM | POA: Diagnosis not present

## 2022-10-07 DIAGNOSIS — M79676 Pain in unspecified toe(s): Secondary | ICD-10-CM

## 2022-10-07 DIAGNOSIS — I712 Thoracic aortic aneurysm, without rupture, unspecified: Secondary | ICD-10-CM | POA: Diagnosis not present

## 2022-11-27 ENCOUNTER — Other Ambulatory Visit: Payer: Self-pay | Admitting: Gastroenterology

## 2022-11-27 DIAGNOSIS — K279 Peptic ulcer, site unspecified, unspecified as acute or chronic, without hemorrhage or perforation: Secondary | ICD-10-CM

## 2022-11-27 DIAGNOSIS — K219 Gastro-esophageal reflux disease without esophagitis: Secondary | ICD-10-CM

## 2022-12-01 ENCOUNTER — Encounter: Payer: Self-pay | Admitting: Internal Medicine

## 2022-12-10 ENCOUNTER — Ambulatory Visit: Payer: 59 | Admitting: Internal Medicine

## 2022-12-10 ENCOUNTER — Encounter: Payer: Self-pay | Admitting: Internal Medicine

## 2022-12-10 VITALS — BP 150/92 | HR 60 | Ht 75.0 in | Wt 207.0 lb

## 2022-12-10 DIAGNOSIS — Z720 Tobacco use: Secondary | ICD-10-CM | POA: Insufficient documentation

## 2022-12-10 DIAGNOSIS — I350 Nonrheumatic aortic (valve) stenosis: Secondary | ICD-10-CM | POA: Insufficient documentation

## 2022-12-10 DIAGNOSIS — I10 Essential (primary) hypertension: Secondary | ICD-10-CM | POA: Diagnosis not present

## 2022-12-10 DIAGNOSIS — I351 Nonrheumatic aortic (valve) insufficiency: Secondary | ICD-10-CM | POA: Insufficient documentation

## 2022-12-10 DIAGNOSIS — I7121 Aneurysm of the ascending aorta, without rupture: Secondary | ICD-10-CM | POA: Diagnosis not present

## 2022-12-10 MED ORDER — ASPIRIN 81 MG PO TBEC: 81.0000 mg | DELAYED_RELEASE_TABLET | Freq: Every day | ORAL | Status: DC

## 2022-12-10 MED ORDER — AMLODIPINE BESYLATE 10 MG PO TABS: 10.0000 mg | ORAL_TABLET | Freq: Every day | ORAL | 3 refills | Status: DC

## 2022-12-10 NOTE — Patient Instructions (Signed)
Medication Instructions:  Your physician has recommended you make the following change in your medication:   -Increase Amlodipine to 10 mg tablet once daily.  -Start Aspirin 81 mg tablet once daily.   *If you need a refill on your cardiac medications before your next appointment, please call your pharmacy*   Lab Work: None If you have labs (blood work) drawn today and your tests are completely normal, you will receive your results only by: MyChart Message (if you have MyChart) OR A paper copy in the mail If you have any lab test that is abnormal or we need to change your treatment, we will call you to review the results.   Testing/Procedures: Your physician has requested that you have an echocardiogram. Echocardiography is a painless test that uses sound waves to create images of your heart. It provides your doctor with information about the size and shape of your heart and how well your heart's chambers and valves are working. This procedure takes approximately one hour. There are no restrictions for this procedure. Please do NOT wear cologne, perfume, aftershave, or lotions (deodorant is allowed). Please arrive 15 minutes prior to your appointment time.  CT- Aorta- 06/2023   Follow-Up: At [facility], you and your health needs are our priority.  As part of our continuing mission to provide you with exceptional heart care, we have created designated Provider Care Teams.  These Care Teams include your primary Cardiologist (physician) and Advanced Practice Providers (APPs -  Physician Assistants and Nurse Practitioners) who all work together to provide you with the care you need, when you need it.  We recommend signing up for the patient portal called "MyChart".  Sign up information is provided on this After Visit Summary.  MyChart is used to connect with patients for Virtual Visits (Telemedicine).  Patients are able to view lab/test results, encounter notes, upcoming  appointments, etc.  Non-urgent messages can be sent to your provider as well.   To learn more about what you can do with MyChart, go to [email or web address].    Your next appointment:   6 month(s)  Provider:   Luane School, MD    Other Instructions

## 2022-12-10 NOTE — Progress Notes (Signed)
Cardiology Office Note  Date: 12/10/2022   ID: KEIGAN SALDARRIAGA, DOB 22-Sep-1960, MRN [id number]  PCP:  Mirna Mires, MD  Cardiologist:  Marjo Bicker, MD Electrophysiologist:  None   History of Present Illness: Stephen Koch is a 62 y.o. 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  He denies any angina, orthopnea, PND, DOE (has SOB symptoms but not daily), dizziness, syncope, leg swelling.  He is active at baseline.  Currently smokes cigarettes.  Past Medical History:  Diagnosis Date   Arthritis    Chronic back pain    Forklift injury   Essential hypertension, benign    GERD (gastroesophageal reflux disease)    History of medication noncompliance     Past Surgical History:  Procedure Laterality Date   BIOPSY  01/04/2019   Procedure: BIOPSY;  Surgeon: West Bali, MD;  [facility];  Service: Endoscopy;;  gastric   COLONOSCOPY N/A 07/06/2012   NFA:OZHYQM mucosa in the terminal ileum/Moderate sized internal hemorrhoids   COLONOSCOPY WITH PROPOFOL N/A 01/04/2019   normal TI, internal hemorrhoids Grade 3 s/p band placement X 3.    ESOPHAGOGASTRODUODENOSCOPY N/A 04/08/2013   VHQ:IONGE gastric ulcer/duodenal inflammation. bx with chronic active gastritis with focal intestinal metaplasia, ulceration and H pylori   ESOPHAGOGASTRODUODENOSCOPY (EGD) WITH PROPOFOL N/A 01/04/2019   moderate gastritis s/p biopsy, negative Hpylori   HEMORRHOID BANDING N/A 01/04/2019   Procedure: HEMORRHOID BANDING;  Surgeon: West Bali, MD;  [facility];  Service: Endoscopy;  Laterality: N/A;   Left knee surgery Left    arthroscopy   QUADRICEPS TENDON REPAIR Left 03/05/2017   Procedure: REPAIR QUADRICEP TENDON;  Surgeon: Vickki Hearing, MD;  Location: [facility];  Service: Orthopedics;  Laterality: Left;   Right arm surgery     tendon repair    Current Outpatient Medications  Medication Sig  Dispense Refill   amLODipine (NORVASC) 10 MG tablet Take 1 tablet (10 mg total) by mouth daily. 90 tablet 3   aspirin EC 81 MG tablet Take 1 tablet (81 mg total) by mouth daily. Swallow whole.     atorvastatin (LIPITOR) 40 MG tablet Take 40 mg by mouth daily.     gabapentin (NEURONTIN) 300 MG capsule Take 300 mg by mouth 2 (two) times daily.      methocarbamol (ROBAXIN) 500 MG tablet Take 500 mg by mouth 2 (two) times daily.     Multiple Vitamin (MULTIVITAMIN WITH MINERALS) TABS tablet Take 1 tablet by mouth daily.     omeprazole (PRILOSEC) 20 MG capsule TAKE ONE CAPSULE BY MOUTH BEFORE BREAKFAST May take additional capsule before supper if needed 180 capsule 3   tamsulosin (FLOMAX) 0.4 MG CAPS capsule Take 0.4 mg by mouth daily.     No current facility-administered medications for this visit.   Allergies:  Ibuprofen   Social History: The patient  reports that he has been smoking cigars. He has never used smokeless tobacco. He reports current alcohol use of about 10.0 standard drinks of alcohol per week. He reports current drug use. Drug: Marijuana.   Family History: The patient's family history includes Heart disease in his father.   ROS:  Please see the history of present illness. Otherwise, complete review of systems is positive for none.  All other systems are reviewed and negative.   Physical Exam: VS:  BP (!) 150/92   Pulse 60   Ht 6'  3" (1.905 m)   Wt 207 lb (93.9 kg)   SpO2 94%   BMI 25.87 kg/m , BMI Body mass index is 25.87 kg/m.  Wt Readings from Last 3 Encounters:  12/10/22 207 lb (93.9 kg)  12/27/21 211 lb 3.2 oz (95.8 kg)  08/13/21 209 lb 3.2 oz (94.9 kg)    General: Patient appears comfortable at rest. HEENT: Conjunctiva and lids normal, oropharynx clear with moist mucosa. Neck: Supple, no elevated JVP or carotid bruits, no thyromegaly. Lungs: Clear to auscultation, nonlabored breathing at rest. Cardiac: Regular rate and rhythm, no S3 or significant systolic  murmur, no pericardial rub. Abdomen: Soft, nontender, no hepatomegaly, bowel sounds present, no guarding or rebound. Extremities: No pitting edema, distal pulses 2+. Skin: Warm and dry. Musculoskeletal: No kyphosis. Neuropsychiatric: Alert and oriented x3, affect grossly appropriate.  Recent Labwork: No results found for requested labs within last 365 days.  No results found for: "CHOL", "TRIG", "HDL", "CHOLHDL", "VLDL", "LDLCALC", "LDLDIRECT"   Assessment and Plan:  Cardiac murmur, systolic and diastolic: Echo from 2015 showed mild aortic stenosis and moderate aortic regurgitation.  Repeat 2D echocardiogram.  HTN, poorly controlled: Home BPs range around 150 mmHg SBP.  Increase amlodipine from 5 mg to 10 mg once daily.  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  Smoking cessation strongly encouraged.  Obtain CTA aorta in 06/2023.  Nicotine abuse: Currently smokes cigarettes, smoker cessation counseling provided. Smoking cessation instruction/counseling given:  counseled patient on the dangers of tobacco use, advised patient to stop smoking, and reviewed strategies to maximize success     Medication Adjustments/Labs and Tests Ordered: Current medicines are reviewed at length with the patient today.  Concerns regarding medicines are outlined above.    Disposition:  Follow up 6 months  Signed, Coila Wardell Verne Spurr, MD, 12/10/2022 10:29 AM    [facility] [address] [place], [place] [address]

## 2023-01-13 ENCOUNTER — Ambulatory Visit (HOSPITAL_COMMUNITY)
Admission: RE | Admit: 2023-01-13 | Discharge: 2023-01-13 | Disposition: A | Discharge status: Home / Self Care | Payer: 59 | Source: Ambulatory Visit | Attending: Internal Medicine | Admitting: Internal Medicine

## 2023-01-13 DIAGNOSIS — I351 Nonrheumatic aortic (valve) insufficiency: Secondary | ICD-10-CM | POA: Diagnosis not present

## 2023-01-13 NOTE — Progress Notes (Signed)
*  PRELIMINARY RESULTS* Echocardiogram 2D Echocardiogram has been performed.  Stacey Drain 01/13/2023, 4:16 PM

## 2023-01-15 ENCOUNTER — Encounter: Payer: Self-pay | Admitting: Internal Medicine

## 2023-01-15 ENCOUNTER — Ambulatory Visit (INDEPENDENT_AMBULATORY_CARE_PROVIDER_SITE_OTHER): Payer: 59 | Admitting: Internal Medicine

## 2023-01-15 VITALS — BP 188/89 | HR 59 | Temp 97.5°F | Ht 75.0 in | Wt 205.8 lb

## 2023-01-15 DIAGNOSIS — K219 Gastro-esophageal reflux disease without esophagitis: Secondary | ICD-10-CM | POA: Diagnosis not present

## 2023-01-15 DIAGNOSIS — K5904 Chronic idiopathic constipation: Secondary | ICD-10-CM | POA: Diagnosis not present

## 2023-01-15 MED ORDER — OMEPRAZOLE 20 MG PO CPDR
20.0000 mg | DELAYED_RELEASE_CAPSULE | Freq: Every day | ORAL | 3 refills | Status: DC
Start: 2023-01-15 — End: 2023-11-05

## 2023-01-15 NOTE — Progress Notes (Signed)
Referring Provider: Mirna Mires, MD Primary Care Physician:  Mirna Mires, MD Primary GI:  Dr. Marletta Lor  Chief Complaint  Patient presents with   Follow-up    One year follow up after one year.    HPI:   Stephen Koch is a 62 y.o. male who presents to the clinic today for follow-up visit.  Last seen 1 year ago.  Chronic GERD: Well-controlled on omeprazole 20 mg daily.  Requesting refills today.  No dysphagia odynophagia.  No epigastric or chest pain.  EGD 01/04/2019 mild gastritis, biopsies negative for H. pylori.  Chronic constipation: Well-controlled on daily laxative.  No more straining.  No rectal bleeding.  Colonoscopy 01/04/2019 unremarkable besides internal hemorrhoids status post banding x 3.  Past Medical History:  Diagnosis Date   Arthritis    Chronic back pain    Forklift injury   Essential hypertension, benign    GERD (gastroesophageal reflux disease)    History of medication noncompliance     Past Surgical History:  Procedure Laterality Date   BIOPSY  01/04/2019   Procedure: BIOPSY;  Surgeon: West Bali, MD;  [facility];  Service: Endoscopy;;  gastric   COLONOSCOPY N/A 07/06/2012   WUJ:WJXBJY mucosa in the terminal ileum/Moderate sized internal hemorrhoids   COLONOSCOPY WITH PROPOFOL N/A 01/04/2019   normal TI, internal hemorrhoids Grade 3 s/p band placement X 3.    ESOPHAGOGASTRODUODENOSCOPY N/A 04/08/2013   NWG:NFAOZ gastric ulcer/duodenal inflammation. bx with chronic active gastritis with focal intestinal metaplasia, ulceration and H pylori   ESOPHAGOGASTRODUODENOSCOPY (EGD) WITH PROPOFOL N/A 01/04/2019   moderate gastritis s/p biopsy, negative Hpylori   HEMORRHOID BANDING N/A 01/04/2019   Procedure: HEMORRHOID BANDING;  Surgeon: West Bali, MD;  [facility];  Service: Endoscopy;  Laterality: N/A;   Left knee surgery Left    arthroscopy   QUADRICEPS TENDON REPAIR Left 03/05/2017   Procedure: REPAIR QUADRICEP TENDON;   Surgeon: Vickki Hearing, MD;  Location: [facility];  Service: Orthopedics;  Laterality: Left;   Right arm surgery     tendon repair    Current Outpatient Medications  Medication Sig Dispense Refill   amLODipine (NORVASC) 10 MG tablet Take 1 tablet (10 mg total) by mouth daily. 90 tablet 3   aspirin EC 81 MG tablet Take 1 tablet (81 mg total) by mouth daily. Swallow whole.     atorvastatin (LIPITOR) 40 MG tablet Take 40 mg by mouth daily.     gabapentin (NEURONTIN) 300 MG capsule Take 300 mg by mouth 2 (two) times daily.      methocarbamol (ROBAXIN) 500 MG tablet Take 500 mg by mouth 2 (two) times daily.     Multiple Vitamin (MULTIVITAMIN WITH MINERALS) TABS tablet Take 1 tablet by mouth daily.     omeprazole (PRILOSEC) 20 MG capsule TAKE ONE CAPSULE BY MOUTH BEFORE BREAKFAST May take additional capsule before supper if needed 180 capsule 3   tamsulosin (FLOMAX) 0.4 MG CAPS capsule Take 0.4 mg by mouth daily.     No current facility-administered medications for this visit.    Allergies as of 01/15/2023 - Review Complete 01/15/2023  Allergen Reaction Noted   Ibuprofen Other (See Comments) 04/06/2015    Family History  Problem Relation Age of Onset   Heart disease Father    Colon cancer Neg Hx    Colon polyps Neg Hx     Social History   Socioeconomic History   Marital status: Married    Spouse  name: Not on file   Number of children: 4   Years of education: Not on file   Highest education level: Not on file  Occupational History   Occupation: Unemployed; previously warehouse work  Tobacco Use   Smoking status: Every Day    Types: Cigars   Smokeless tobacco: Never   Tobacco comments:    smokes about 6-7 black and milds per day   Vaping Use   Vaping status: Never Used  Substance and Sexual Activity   Alcohol use: Yes    Alcohol/week: 10.0 standard drinks of alcohol    Types: 10 Cans of beer per week   Drug use: Yes    Types: Marijuana    Comment: Marijuana 2 times  per week   Sexual activity: Yes    Birth control/protection: None  Other Topics Concern   Not on file  Social History Narrative   Lives w/ grandma   Social Determinants of Health   Financial Resource Strain: Not on file  Food Insecurity: Not on file  Transportation Needs: Not on file  Physical Activity: Not on file  Stress: Not on file  Social Connections: Not on file    Subjective: Review of Systems  Constitutional:  Negative for chills and fever.  HENT:  Negative for congestion and hearing loss.   Eyes:  Negative for blurred vision and double vision.  Respiratory:  Negative for cough and shortness of breath.   Cardiovascular:  Negative for chest pain and palpitations.  Gastrointestinal:  Positive for constipation and heartburn. Negative for abdominal pain, blood in stool, diarrhea, melena and vomiting.  Genitourinary:  Negative for dysuria and urgency.  Musculoskeletal:  Negative for joint pain and myalgias.  Skin:  Negative for itching and rash.  Neurological:  Negative for dizziness and headaches.  Psychiatric/Behavioral:  Negative for depression. The patient is not nervous/anxious.      Objective: BP (!) 188/89   Pulse (!) 59   Temp (!) 97.5 F (36.4 C)   Ht 6\' 3"  (1.905 m)   Wt 205 lb 12.8 oz (93.4 kg)   BMI 25.72 kg/m  Physical Exam Constitutional:      Appearance: Normal appearance.  HENT:     Head: Normocephalic and atraumatic.  Eyes:     Extraocular Movements: Extraocular movements intact.     Conjunctiva/sclera: Conjunctivae normal.  Cardiovascular:     Rate and Rhythm: Normal rate and regular rhythm.     Heart sounds: Murmur heard.  Pulmonary:     Effort: Pulmonary effort is normal.     Breath sounds: Normal breath sounds.  Abdominal:     General: Bowel sounds are normal.     Palpations: Abdomen is soft.  Musculoskeletal:        General: Normal range of motion.     Cervical back: Normal range of motion and neck supple.  Skin:    General:  Skin is warm.  Neurological:     General: No focal deficit present.     Mental Status: He is alert and oriented to person, place, and time.  Psychiatric:        Mood and Affect: Mood normal.        Behavior: Behavior normal.      Assessment/Plan:  1.  Chronic GERD-well-controlled on omeprazole 20 mg daily.  Will continue.  Sent in year supply of refills today.  2.  Chronic idiopathic constipation-improved on laxative, continue.  Recommend fiber therapy.  Stay well-hydrated.  Follow-up in 1 year or  sooner if needed. 01/15/2023 9:57 AM   Disclaimer: This note was dictated with voice recognition software. Similar sounding words can inadvertently be transcribed and may not be corrected upon review.

## 2023-01-15 NOTE — Patient Instructions (Signed)
Continue on omeprazole daily for your chronic acid reflux.  I have sent you in a new prescription to [facility] with refills to last you a year.  Continue laxative for your chronic constipation.  Follow-up in 1 year or sooner if needed.  It was very nice meeting you today.  Dr. Marletta Lor

## 2023-01-19 DIAGNOSIS — G5793 Unspecified mononeuropathy of bilateral lower limbs: Secondary | ICD-10-CM | POA: Diagnosis not present

## 2023-01-19 DIAGNOSIS — I10 Essential (primary) hypertension: Secondary | ICD-10-CM | POA: Diagnosis not present

## 2023-01-19 DIAGNOSIS — I712 Thoracic aortic aneurysm, without rupture, unspecified: Secondary | ICD-10-CM | POA: Diagnosis not present

## 2023-01-19 DIAGNOSIS — J432 Centrilobular emphysema: Secondary | ICD-10-CM | POA: Diagnosis not present

## 2023-01-19 DIAGNOSIS — I700 Atherosclerosis of aorta: Secondary | ICD-10-CM | POA: Diagnosis not present

## 2023-01-19 DIAGNOSIS — E7800 Pure hypercholesterolemia, unspecified: Secondary | ICD-10-CM | POA: Diagnosis not present

## 2023-01-19 DIAGNOSIS — Z720 Tobacco use: Secondary | ICD-10-CM | POA: Diagnosis not present

## 2023-01-19 DIAGNOSIS — M150 Primary generalized (osteo)arthritis: Secondary | ICD-10-CM | POA: Diagnosis not present

## 2023-01-20 ENCOUNTER — Other Ambulatory Visit (HOSPITAL_COMMUNITY)
Admission: RE | Admit: 2023-01-20 | Discharge: 2023-01-20 | Disposition: A | Discharge status: Home / Self Care | Payer: 59 | Source: Ambulatory Visit | Attending: Internal Medicine | Admitting: Internal Medicine

## 2023-01-20 ENCOUNTER — Ambulatory Visit: Payer: 59 | Attending: Internal Medicine | Admitting: Internal Medicine

## 2023-01-20 ENCOUNTER — Other Ambulatory Visit: Payer: Self-pay

## 2023-01-20 ENCOUNTER — Encounter: Payer: Self-pay | Admitting: Internal Medicine

## 2023-01-20 VITALS — BP 152/80 | HR 64 | Ht 75.0 in | Wt 210.0 lb

## 2023-01-20 DIAGNOSIS — I350 Nonrheumatic aortic (valve) stenosis: Secondary | ICD-10-CM

## 2023-01-20 DIAGNOSIS — I517 Cardiomegaly: Secondary | ICD-10-CM | POA: Insufficient documentation

## 2023-01-20 DIAGNOSIS — R0602 Shortness of breath: Secondary | ICD-10-CM

## 2023-01-20 DIAGNOSIS — R9431 Abnormal electrocardiogram [ECG] [EKG]: Secondary | ICD-10-CM

## 2023-01-20 LAB — BRAIN NATRIURETIC PEPTIDE: B Natriuretic Peptide: 85 pg/mL (ref 0.0–100.0)

## 2023-01-20 MED ORDER — BLOOD PRESSURE CUFF MISC: 1.0000 | Freq: Once | 0 refills | Status: AC

## 2023-01-20 NOTE — Patient Instructions (Signed)
Medication Instructions:  Your physician recommends that you continue on your current medications as directed. Please refer to the Current Medication list given to you today.  *If you need a refill on your cardiac medications before your next appointment, please call your pharmacy*   Lab Work: BNP  If you have labs (blood work) drawn today and your tests are completely normal, you will receive your results only by: MyChart Message (if you have MyChart) OR A paper copy in the mail If you have any lab test that is abnormal or we need to change your treatment, we will call you to review the results.   Testing/Procedures: Amyloid Workup  Your physician has requested that you have an exercise tolerance test. For further information please visit [email or web address] Please also follow instruction sheet, as given.    Follow-Up: At [facility], you and your health needs are our priority.  As part of our continuing mission to provide you with exceptional heart care, we have created designated Provider Care Teams.  These Care Teams include your primary Cardiologist (physician) and Advanced Practice Providers (APPs -  Physician Assistants and Nurse Practitioners) who all work together to provide you with the care you need, when you need it.  We recommend signing up for the patient portal called "MyChart".  Sign up information is provided on this After Visit Summary.  MyChart is used to connect with patients for Virtual Visits (Telemedicine).  Patients are able to view lab/test results, encounter notes, upcoming appointments, etc.  Non-urgent messages can be sent to your provider as well.   To learn more about what you can do with MyChart, go to [email or web address].    Your next appointment:   3 month(s)  Provider:   You may see Vishnu P Mallipeddi, MD or one of the following Advanced Practice Providers on your designated Care Team:   [place], PA-C  Jacolyn Reedy, [place]      Other Instructions

## 2023-01-23 DIAGNOSIS — I517 Cardiomegaly: Secondary | ICD-10-CM | POA: Insufficient documentation

## 2023-01-26 LAB — MULTIPLE MYELOMA PANEL, SERUM
Albumin SerPl Elph-Mcnc: 3.7 g/dL (ref 2.9–4.4)
Albumin/Glob SerPl: 1.2 (ref 0.7–1.7)
Alpha 1: 0.2 g/dL (ref 0.0–0.4)
Alpha2 Glob SerPl Elph-Mcnc: 0.7 g/dL (ref 0.4–1.0)
B-Globulin SerPl Elph-Mcnc: 1.1 g/dL (ref 0.7–1.3)
Gamma Glob SerPl Elph-Mcnc: 1.2 g/dL (ref 0.4–1.8)
Globulin, Total: 3.3 g/dL (ref 2.2–3.9)
IgA: 427 mg/dL (ref 61–437)
IgG (Immunoglobin G), Serum: 1331 mg/dL (ref 603–1613)
IgM (Immunoglobulin M), Srm: 97 mg/dL (ref 20–172)
Total Protein ELP: 7 g/dL (ref 6.0–8.5)

## 2023-01-27 ENCOUNTER — Telehealth (HOSPITAL_COMMUNITY): Payer: Self-pay | Admitting: Emergency Medicine

## 2023-01-27 NOTE — Telephone Encounter (Signed)
Pt would like to cancet GXT d/t fall at home. Pt instructed to call Cardiology office to reschedule.

## 2023-01-28 ENCOUNTER — Ambulatory Visit (HOSPITAL_COMMUNITY): Payer: 59

## 2023-02-06 ENCOUNTER — Telehealth (HOSPITAL_COMMUNITY): Payer: Self-pay | Admitting: *Deleted

## 2023-02-06 NOTE — Telephone Encounter (Signed)
Pt given instructions for Amyloid study.

## 2023-02-11 ENCOUNTER — Ambulatory Visit (HOSPITAL_COMMUNITY): Payer: 59 | Attending: Internal Medicine

## 2023-02-11 DIAGNOSIS — I517 Cardiomegaly: Secondary | ICD-10-CM

## 2023-02-11 DIAGNOSIS — R9431 Abnormal electrocardiogram [ECG] [EKG]: Secondary | ICD-10-CM | POA: Diagnosis not present

## 2023-02-11 LAB — MYOCARDIAL AMYLOID PLANAR & SPECT: H/CL Ratio: 1.23

## 2023-02-11 MED ORDER — TECHNETIUM TC 99M PYROPHOSPHATE
20.5000 | Freq: Once | INTRAVENOUS | Status: AC
  Administered 2023-02-11: 20.5 via INTRAVENOUS

## 2023-02-24 ENCOUNTER — Telehealth: Payer: Self-pay

## 2023-02-24 DIAGNOSIS — E854 Organ-limited amyloidosis: Secondary | ICD-10-CM

## 2023-02-24 NOTE — Telephone Encounter (Signed)
-----   Message from Vishnu P Mallipeddi sent at 02/24/2023 10:46 AM EDT ----- PYP scan is equivocal for cardiac amyloidosis, will need to obtain cardiac MRI.

## 2023-02-24 NOTE — Telephone Encounter (Signed)
Patient notified and verbalized understanding. Pt had no questions or concerns at this time. PCP copied.  ?

## 2023-05-01 ENCOUNTER — Other Ambulatory Visit (HOSPITAL_COMMUNITY): Payer: Self-pay | Admitting: *Deleted

## 2023-05-01 ENCOUNTER — Telehealth (HOSPITAL_COMMUNITY): Payer: Self-pay | Admitting: *Deleted

## 2023-05-01 DIAGNOSIS — Z01812 Encounter for preprocedural laboratory examination: Secondary | ICD-10-CM

## 2023-05-01 NOTE — Telephone Encounter (Signed)
 Reaching out to patient to offer assistance regarding upcoming cardiac imaging study; pt verbalizes understanding of appt date/time, parking situation and where to check in, and verified current allergies; name and call back number provided for further questions should they arise  Chantal Requena RN Navigator Cardiac Imaging [facility] [phone] office [phone] cell  Patient denies metal or claustrophobia. He is aware to obtain lab work prior to his cardiac MRI.

## 2023-05-04 DIAGNOSIS — E7800 Pure hypercholesterolemia, unspecified: Secondary | ICD-10-CM | POA: Diagnosis not present

## 2023-05-04 DIAGNOSIS — M150 Primary generalized (osteo)arthritis: Secondary | ICD-10-CM | POA: Diagnosis not present

## 2023-05-04 DIAGNOSIS — Z720 Tobacco use: Secondary | ICD-10-CM | POA: Diagnosis not present

## 2023-05-04 DIAGNOSIS — J432 Centrilobular emphysema: Secondary | ICD-10-CM | POA: Diagnosis not present

## 2023-05-04 DIAGNOSIS — G5793 Unspecified mononeuropathy of bilateral lower limbs: Secondary | ICD-10-CM | POA: Diagnosis not present

## 2023-05-04 DIAGNOSIS — I10 Essential (primary) hypertension: Secondary | ICD-10-CM | POA: Diagnosis not present

## 2023-05-04 DIAGNOSIS — I351 Nonrheumatic aortic (valve) insufficiency: Secondary | ICD-10-CM | POA: Diagnosis not present

## 2023-05-04 DIAGNOSIS — I700 Atherosclerosis of aorta: Secondary | ICD-10-CM | POA: Diagnosis not present

## 2023-05-04 DIAGNOSIS — Z01812 Encounter for preprocedural laboratory examination: Secondary | ICD-10-CM | POA: Diagnosis not present

## 2023-05-05 ENCOUNTER — Ambulatory Visit (HOSPITAL_COMMUNITY)
Admission: RE | Admit: 2023-05-05 | Discharge: 2023-05-05 | Disposition: A | Discharge status: Home / Self Care | Payer: 59 | Source: Ambulatory Visit | Attending: Internal Medicine | Admitting: Internal Medicine

## 2023-05-05 ENCOUNTER — Other Ambulatory Visit: Payer: Self-pay | Admitting: Internal Medicine

## 2023-05-05 DIAGNOSIS — I43 Cardiomyopathy in diseases classified elsewhere: Secondary | ICD-10-CM | POA: Diagnosis not present

## 2023-05-05 DIAGNOSIS — E854 Organ-limited amyloidosis: Secondary | ICD-10-CM

## 2023-05-05 LAB — CBC
Hematocrit: 42 % (ref 37.5–51.0)
Hemoglobin: 14.1 g/dL (ref 13.0–17.7)
MCH: 30.7 pg (ref 26.6–33.0)
MCHC: 33.6 g/dL (ref 31.5–35.7)
MCV: 92 fL (ref 79–97)
Platelets: 235 10*3/uL (ref 150–450)
RBC: 4.59 x10E6/uL (ref 4.14–5.80)
RDW: 12.9 % (ref 11.6–15.4)
WBC: 6.8 10*3/uL (ref 3.4–10.8)

## 2023-05-05 MED ORDER — GADOBUTROL 1 MMOL/ML IV SOLN
10.0000 mL | Freq: Once | INTRAVENOUS | Status: AC | PRN
  Administered 2023-05-05: 10 mL via INTRAVENOUS

## 2023-05-25 ENCOUNTER — Encounter: Payer: Self-pay | Admitting: Internal Medicine

## 2023-05-25 ENCOUNTER — Other Ambulatory Visit (HOSPITAL_COMMUNITY)
Admission: RE | Admit: 2023-05-25 | Discharge: 2023-05-25 | Disposition: A | Discharge status: Home / Self Care | Payer: 59 | Source: Ambulatory Visit | Attending: Internal Medicine | Admitting: Internal Medicine

## 2023-05-25 ENCOUNTER — Ambulatory Visit: Payer: 59 | Attending: Internal Medicine | Admitting: Internal Medicine

## 2023-05-25 VITALS — BP 154/98 | HR 81 | Ht 75.0 in | Wt 215.0 lb

## 2023-05-25 DIAGNOSIS — R011 Cardiac murmur, unspecified: Secondary | ICD-10-CM

## 2023-05-25 DIAGNOSIS — R0602 Shortness of breath: Secondary | ICD-10-CM

## 2023-05-25 DIAGNOSIS — I350 Nonrheumatic aortic (valve) stenosis: Secondary | ICD-10-CM | POA: Diagnosis not present

## 2023-05-25 DIAGNOSIS — I10 Essential (primary) hypertension: Secondary | ICD-10-CM | POA: Diagnosis not present

## 2023-05-25 LAB — BRAIN NATRIURETIC PEPTIDE: B Natriuretic Peptide: 130 pg/mL — ABNORMAL HIGH (ref 0.0–100.0)

## 2023-05-25 NOTE — Patient Instructions (Addendum)
Medication Instructions:  Your physician recommends that you continue on your current medications as directed. Please refer to the Current Medication list given to you today.   Labwork: BNP  Testing/Procedures: Your physician has requested that you have an echocardiogram. Echocardiography is a painless test that uses sound waves to create images of your heart. It provides your doctor with information about the size and shape of your heart and how well your heart's chambers and valves are working. This procedure takes approximately one hour. There are no restrictions for this procedure. Please do NOT wear cologne, perfume, aftershave, or lotions (deodorant is allowed). Please arrive 15 minutes prior to your appointment time.  Please note: We ask at that you not bring children with you during ultrasound (echo/ vascular) testing. Due to room size and safety concerns, children are not allowed in the ultrasound rooms during exams. Our front office staff cannot provide observation of children in our lobby area while testing is being conducted. An adult accompanying a patient to their appointment will only be allowed in the ultrasound room at the discretion of the ultrasound technician under special circumstances. We apologize for any inconvenience.   Follow-Up: 6 months Dr.Mallipeddi  Any Other Special Instructions Will Be Listed Below (If Applicable).   You have been referred to [facility] in [place].They will call you to schedule appointment.      Thank you for choosing [facility] !         If you need a refill on your cardiac medications before your next appointment, please call your pharmacy.

## 2023-05-25 NOTE — Progress Notes (Signed)
Cardiology Office Note  Date: 05/25/2023   ID: DUILIO Koch, DOB [birth year], MRN [id number]  PCP:  Mirna Mires, MD  Cardiologist:  Marjo Bicker, MD Electrophysiologist:  None   History of Present Illness: Stephen Koch is a 63 y.o. male   Echo 12/2022 showed normal LVEF, severe LVH, G1 DD, severe AS, mild to moderate ar, borderline dilatation of the aortic root, 39 mm (4.4 cm per CT imaging in 06/2022).  He underwent amyloid screening (due to severe aortic valve stenosis, LVH) that did not show any evidence of cardiac amyloidosis. Cardiac MRI showed at least moderate aortic valve stenosis with aortic regurgitation.  He reports having DOE with household chores but thought this could be from deconditioning and fatigue for the last 1 to 2 years. no angina, dizziness, syncope, leg swelling.  No orthopnea, PND.  No leg swelling.  Current smoker.   Past Medical History:  Diagnosis Date   Arthritis    Chronic back pain    Forklift injury   Essential hypertension, benign    GERD (gastroesophageal reflux disease)    History of medication noncompliance     Past Surgical History:  Procedure Laterality Date   BIOPSY  01/04/2019   Procedure: BIOPSY;  Surgeon: West Bali, MD;  [facility];  Service: Endoscopy;;  gastric   COLONOSCOPY N/A 07/06/2012   FAO:ZHYQMV mucosa in the terminal ileum/Moderate sized internal hemorrhoids   COLONOSCOPY WITH PROPOFOL N/A 01/04/2019   normal TI, internal hemorrhoids Grade 3 s/p band placement X 3.    ESOPHAGOGASTRODUODENOSCOPY N/A 04/08/2013   HQI:ONGEX gastric ulcer/duodenal inflammation. bx with chronic active gastritis with focal intestinal metaplasia, ulceration and H pylori   ESOPHAGOGASTRODUODENOSCOPY (EGD) WITH PROPOFOL N/A 01/04/2019   moderate gastritis s/p biopsy, negative Hpylori   HEMORRHOID BANDING N/A 01/04/2019   Procedure: HEMORRHOID BANDING;  Surgeon: West Bali, MD;  [facility];  Service: Endoscopy;   Laterality: N/A;   Left knee surgery Left    arthroscopy   QUADRICEPS TENDON REPAIR Left 03/05/2017   Procedure: REPAIR QUADRICEP TENDON;  Surgeon: Vickki Hearing, MD;  Location: [facility];  Service: Orthopedics;  Laterality: Left;   Right arm surgery     tendon repair    Current Outpatient Medications  Medication Sig Dispense Refill   aspirin EC 81 MG tablet Take 1 tablet (81 mg total) by mouth daily. Swallow whole.     atorvastatin (LIPITOR) 40 MG tablet Take 40 mg by mouth daily.     gabapentin (NEURONTIN) 300 MG capsule Take 300 mg by mouth 2 (two) times daily.      methocarbamol (ROBAXIN) 500 MG tablet Take 500 mg by mouth 2 (two) times daily.     Multiple Vitamin (MULTIVITAMIN WITH MINERALS) TABS tablet Take 1 tablet by mouth daily.     omeprazole (PRILOSEC) 20 MG capsule Take 1 capsule (20 mg total) by mouth daily. TAKE ONE CAPSULE BY MOUTH BEFORE BREAKFAST May take additional capsule before supper if needed 90 capsule 3   tamsulosin (FLOMAX) 0.4 MG CAPS capsule Take 0.4 mg by mouth daily.     amLODipine (NORVASC) 10 MG tablet Take 1 tablet (10 mg total) by mouth daily. 90 tablet 3   No current facility-administered medications for this visit.   Allergies:  Ibuprofen   Social History: The patient  reports that he has been smoking cigars. He has never used smokeless tobacco. He reports current alcohol use of about 10.0 standard drinks of  alcohol per week. He reports current drug use. Drug: Marijuana.   Family History: The patient's family history includes Heart disease in his father.   ROS:  Please see the history of present illness. Otherwise, complete review of systems is positive for none.  All other systems are reviewed and negative.   Physical Exam: VS:  BP (!) 154/98   Pulse 81   Ht 6\' 3"  (1.905 m)   Wt 215 lb (97.5 kg)   SpO2 92%   BMI 26.87 kg/m , BMI Body mass index is 26.87 kg/m.  Wt Readings from Last 3 Encounters:  05/25/23 215 lb (97.5 kg)  01/20/23 210  lb (95.3 kg)  01/15/23 205 lb 12.8 oz (93.4 kg)    General: Patient appears comfortable at rest. HEENT: Conjunctiva and lids normal, oropharynx clear with moist mucosa. Neck: Supple, no elevated JVP or carotid bruits, no thyromegaly. Lungs: Clear to auscultation, nonlabored breathing at rest. Cardiac: Regular rate and rhythm, no S3 or significant systolic murmur, no pericardial rub. Abdomen: Soft, nontender, no hepatomegaly, bowel sounds present, no guarding or rebound. Extremities: No pitting edema, distal pulses 2+. Skin: Warm and dry. Musculoskeletal: No kyphosis. Neuropsychiatric: Alert and oriented x3, affect grossly appropriate.  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  BNP was obtained in 9/24 that was normal.  He subsequently underwent cardiac MRI in January 2025 that showed at least moderate aortic valve stenosis with aortic regurgitation, pulmonary hypertension, normal LVEF with mildly dilated LV and normal RV function, mildly dilated ascending aorta 42 mm. Physical examination today showed ejection systolic murmur with absent S2 consistent with severe aortic valve stenosis. BNP obtained today is mildly elevated, 130. He does report DOE with household chores and fatigue for the last 2 years but he thought  this could be from deconditioning. Unclear if this can be considered as his symptoms.  He walks with a cane. He does have severe LVH on EKG and echocardiogram. Likely secondary to severe aortic valve stenosis as he does have HTN but only on 1 antihypertensive medication, home BP controlled. Other differential would be hypertrophic nonobstructive cardiomyopathy but no significant LVOT gradient at rest.  He has severe aortic valve stenosis based on echocardiogram findings and physical examination today.  He will benefit from therapeutic intervention due to mildly elevated BNP, severe LVH and severe aortic valve stenosis. Will refer him to structural cardiology.  Will repeat echocardiogram before he sees structural cardiology. -He also has mild to moderate aortic regurgitation in the setting of ascending aorta aneurysm 4.4 cm in March 2024 on CT chest lung cancer screening, 4.2 cm on cardiac MRI in January 2025.  Will monitor. -Continue amlodipine 10 mg once daily, home BPs range around 130 mmHg SBP. -Obtain CTA chest/aorta in 1 year for surveillance of ascending aorta aneurysm.  Smoking cessation counseling.   Medication Adjustments/Labs and Tests Ordered: Current  medicines are reviewed at length with the patient today.  Concerns regarding medicines are outlined above.    Disposition:  Follow up 6 months  Signed, Brittony Billick Verne Spurr, MD, 05/25/2023 3:07 PM    [facility] at [facility] [address], [place], [place] [address]

## 2023-06-07 NOTE — H&P (View-Only) (Signed)
 Patient ID: Stephen Koch MRN: [id number] DOB/AGE: 1961/04/25 63 y.o.  Primary Care Physician:Hill, Earvin Hansen, MD Primary Cardiologist: Mallipeddi  CC:  Aortic valvular disease management  FOCUSED CARDIOVASCULAR PROBLEM LIST:   AS AVA 0.83, MG 47, V-max 4.8: EF 60 to 65% TTE 2024 EKG sinus rhythm without bundle-branch blocks AR Moderate, CMR 2025 Diastolic dysfunction Severe LVH CMR 2025 Hypertension Hyperlipidemia Aortic atherosclerosis Chest CT 2024 Ascending thoracic aneurysm 4.4 cm chest CT 2024 Ongoing tobacco abuse BMI 26/BSA 2.2   HISTORY OF PRESENT ILLNESS: The patient is a 63 y.o. male with the indicated medical history here for recommendations regarding his aortic valvular disease.  He was seen by his primary cardiologist recently with reports of dyspnea.  The patient is a longtime smoker and does not have PFTs in our system.  On review of his chart he has been evaluated with the CMR to assess for amyloid due to severe LVH on echocardiogram.  This was negative.  His echo and CMR did demonstrate significant aortic valvular disease with AAS and AR.  Additionally he does have a 4.4 cm ascending thoracic aneurysm on a CT done last year.  Due to ongoing dyspnea and aortic valvular disease he is here for further recommendations.  The patient is a very pleasant retired gentleman.  He has noticed over the last year or so that he has become more short of breath and fatigue.  He has noticed occasional chest discomfort and tightness as well.  This does not seem to happen at rest.  He has a long history of smoking and occasionally notices some wheezing with exertion.  He denies any signs or symptoms of heart failure however.  He has had no presyncope or syncope.  He denies a history of stroke or heart attack.  He fortunately does not required any emergency room visits or hospitalizations.  He has partial dentures and no dental or oral issues.  Past Medical History:  Diagnosis Date    Arthritis    Chronic back pain    Forklift injury   Essential hypertension, benign    GERD (gastroesophageal reflux disease)    History of medication noncompliance     Past Surgical History:  Procedure Laterality Date   BIOPSY  01/04/2019   Procedure: BIOPSY;  Surgeon: West Bali, MD;  Location: [facility];  Service: Endoscopy;;  gastric   COLONOSCOPY N/A 07/06/2012   MVH:QIONGE mucosa in the terminal ileum/Moderate sized internal hemorrhoids   COLONOSCOPY WITH PROPOFOL N/A 01/04/2019   normal TI, internal hemorrhoids Grade 3 s/p band placement X 3.    ESOPHAGOGASTRODUODENOSCOPY N/A 04/08/2013   XBM:WUXLK gastric ulcer/duodenal inflammation. bx with chronic active gastritis with focal intestinal metaplasia, ulceration and H pylori   ESOPHAGOGASTRODUODENOSCOPY (EGD) WITH PROPOFOL N/A 01/04/2019   moderate gastritis s/p biopsy, negative Hpylori   HEMORRHOID BANDING N/A 01/04/2019   Procedure: HEMORRHOID BANDING;  Surgeon: West Bali, MD;  Location: [facility];  Service: Endoscopy;  Laterality: N/A;   Left knee surgery Left    arthroscopy   QUADRICEPS TENDON REPAIR Left 03/05/2017   Procedure: REPAIR QUADRICEP TENDON;  Surgeon: Vickki Hearing, MD;  Location: [facility];  Service: Orthopedics;  Laterality: Left;   Right arm surgery     tendon repair    Family History  Problem Relation Age of Onset   Heart disease Father    Colon cancer Neg Hx    Colon polyps Neg Hx     Social History   Socioeconomic  History   Marital status: Married    Spouse name: Not on file   Number of children: 4   Years of education: Not on file   Highest education level: Not on file  Occupational History   Occupation: Unemployed; previously warehouse work  Tobacco Use   Smoking status: Every Day    Types: Cigars   Smokeless tobacco: Never   Tobacco comments:    smokes about 6-7 black and milds per day   Vaping Use   Vaping status: Never Used  Substance and Sexual Activity   Alcohol  use: Yes    Alcohol/week: 10.0 standard drinks of alcohol    Types: 10 Cans of beer per week   Drug use: Yes    Types: Marijuana    Comment: Marijuana 2 times per week   Sexual activity: Yes    Birth control/protection: None  Other Topics Concern   Not on file  Social History Narrative   Lives w/ grandma   Social Drivers of Health   Financial Resource Strain: Not on file  Food Insecurity: Not on file  Transportation Needs: Not on file  Physical Activity: Not on file  Stress: Not on file  Social Connections: Not on file  Intimate Partner Violence: Not on file     Prior to Admission medications   Medication Sig Start Date End Date Taking? Authorizing Provider  amLODipine (NORVASC) 10 MG tablet Take 1 tablet (10 mg total) by mouth daily. 12/10/22 03/10/23  Mallipeddi, Orion Modest, MD  aspirin EC 81 MG tablet Take 1 tablet (81 mg total) by mouth daily. Swallow whole. 12/10/22   Mallipeddi, Vishnu P, MD  atorvastatin (LIPITOR) 40 MG tablet Take 40 mg by mouth daily. 12/08/22   [provider]  gabapentin (NEURONTIN) 300 MG capsule Take 300 mg by mouth 2 (two) times daily.  05/11/19   [provider]  methocarbamol (ROBAXIN) 500 MG tablet Take 500 mg by mouth 2 (two) times daily. 04/14/19   [provider]  Multiple Vitamin (MULTIVITAMIN WITH MINERALS) TABS tablet Take 1 tablet by mouth daily.    [provider]  omeprazole (PRILOSEC) 20 MG capsule Take 1 capsule (20 mg total) by mouth daily. TAKE ONE CAPSULE BY MOUTH BEFORE BREAKFAST May take additional capsule before supper if needed 01/15/23 01/15/24  Lanelle Bal, DO  tamsulosin (FLOMAX) 0.4 MG CAPS capsule Take 0.4 mg by mouth daily. 11/11/21   [provider]    Allergies  Allergen Reactions   Ibuprofen Other (See Comments)    Causes gastric bleeding    REVIEW OF SYSTEMS:  General: no fevers/chills/night sweats Eyes: no blurry vision, diplopia, or amaurosis ENT: no sore throat or  hearing loss Resp: no cough, wheezing, or hemoptysis CV: no edema or palpitations GI: no abdominal pain, nausea, vomiting, diarrhea, or constipation GU: no dysuria, frequency, or hematuria Skin: no rash Neuro: no headache, numbness, tingling, or weakness of extremities Musculoskeletal: no joint pain or swelling Heme: no bleeding, DVT, or easy bruising Endo: no polydipsia or polyuria  BP 134/70   Pulse 84   Ht 6\' 3"  (1.905 m)   Wt 209 lb (94.8 kg)   SpO2 94%   BMI 26.12 kg/m   PHYSICAL EXAM: GEN:  AO x 3 in no acute distress HEENT: normal Dentition: Normal Neck: JVP normal. +2 carotid upstrokes without bruits. No thyromegaly. Lungs: equal expansion, clear bilaterally CV: Apex is discrete and nondisplaced, RRR with a 3/6 crescendo decrescendo murmur Abd: soft, non-tender, non-distended;  no bruit; positive bowel sounds Ext: no edema, ecchymoses, or cyanosis Vascular: 2+ femoral pulses, 2+ radial pulses       Skin: warm and dry without rash Neuro: CN II-XII grossly intact; motor and sensory grossly intact    DATA AND STUDIES:  EKG: EKG with sinus rhythm LVH and possible inferior infarction pattern January 2025  EKG Interpretation Date/Time:    Ventricular Rate:    PR Interval:    QRS Duration:    QT Interval:    QTC Calculation:   R Axis:      Text Interpretation:          Cardiac Studies & Procedures   ______________________________________________________________________________________________     ECHOCARDIOGRAM  ECHOCARDIOGRAM COMPLETE 06/09/2023  Narrative ECHOCARDIOGRAM REPORT    Patient Name:   Stephen Koch Date of Exam: 06/09/2023 Medical Rec #:  [id number]        Height:       75.0 in Accession #:    [id number]       Weight:       215.0 lb Date of Birth:  [birth year]        BSA:          2.263 m Patient Age:    63 years         BP:           184/89 mmHg Patient Gender: M                HR:           69 bpm. Ex[facility]  Procedure: 2D Echo, Cardiac Doppler and Color Doppler  Indications:    Aortic Stenosis l35.0  History:        Patient has prior history of Echocardiogram examinations, most recent 01/13/2023. Signs/Symptoms:Murmur; Risk Factors:Hypertension. ETOH and Nicotine abuse.  Sonographer:    Celesta Gentile RCS Referring Phys: [id number] VISHNU P MALLIPEDDI  IMPRESSIONS   1. Left ventricular ejection fraction, by estimation, is 60 to 65%. The left ventricle has normal function. The left ventricle has no regional wall motion abnormalities. There is severe asymmetric left ventricular hypertrophy of the septal segment. Left ventricular diastolic parameters are consistent with Grade I diastolic dysfunction (impaired relaxation). Elevated left ventricular end-diastolic pressure. 2. Right ventricular systolic function is normal. The right ventricular size is normal. Tricuspid regurgitation signal is inadequate for assessing PA pressure. 3. Left atrial size was moderately dilated. 4. Right atrial size was moderately dilated. 5. The mitral valve is normal in structure. Trivial mitral valve regurgitation. No evidence of mitral stenosis. 6. The aortic valve is tricuspid. There is mild restriction in the mobility of aortic valve leaflets. There is severe calcifcation of the aortic valve. Aortic valve regurgitation is mild to moderate, eccentric jet, visually but mild in severity based on PHT. Severe aortic valve stenosis based on velcoities but mild aortic valve stenosis based on aortic valve planimetry which is 1.8 cm2. Aortic regurgitation PHT measures 510 msec. Aortic valve area, by VTI measures 1.04 cm. Aortic valve mean gradient measures 45.0 mmHg. Aortic valve Vmax measures 4.52 m/s. DVI is 0.25. 7. Aortic dilatation noted. There is mild dilatation of the aortic root, measuring 41 mm. 8. The inferior vena cava is normal in size with greater than 50% respiratory variability, suggesting right atrial  pressure of 3 mmHg. 9. Increased flow velocities may be secondary to anemia, thyrotoxicosis, hyperdynamic or high flow state.  Comparison(s): No significant change from prior study.  FINDINGS Left  Ventricle: Left ventricular ejection fraction, by estimation, is 60 to 65%. The left ventricle has normal function. The left ventricle has no regional wall motion abnormalities. The left ventricular internal cavity size was normal in size. There is severe asymmetric left ventricular hypertrophy of the septal segment. Left ventricular diastolic parameters are consistent with Grade I diastolic dysfunction (impaired relaxation). Elevated left ventricular end-diastolic pressure.  Right Ventricle: The right ventricular size is normal. No increase in right ventricular wall thickness. Right ventricular systolic function is normal. Tricuspid regurgitation signal is inadequate for assessing PA pressure.  Left Atrium: Left atrial size was moderately dilated.  Right Atrium: Right atrial size was moderately dilated.  Pericardium: There is no evidence of pericardial effusion.  Mitral Valve: The mitral valve is normal in structure. Trivial mitral valve regurgitation. No evidence of mitral valve stenosis.  Tricuspid Valve: The tricuspid valve is normal in structure. Tricuspid valve regurgitation is not demonstrated. No evidence of tricuspid stenosis.  Aortic Valve: The aortic valve is tricuspid. There is severe calcifcation of the aortic valve. Aortic valve regurgitation is mild to moderate. Aortic regurgitation PHT measures 510 msec. Severe aortic stenosis is present. Aortic valve mean gradient measures 45.0 mmHg. Aortic valve peak gradient measures 81.7 mmHg. Aortic valve area, by VTI measures 1.04 cm.  Pulmonic Valve: The pulmonic valve was normal in structure. Pulmonic valve regurgitation is not visualized. No evidence of pulmonic stenosis.  Aorta: Aortic dilatation noted. There is mild dilatation of the  aortic root, measuring 41 mm.  Venous: The inferior vena cava is normal in size with greater than 50% respiratory variability, suggesting right atrial pressure of 3 mmHg.  IAS/Shunts: No atrial level shunt detected by color flow Doppler.   LEFT VENTRICLE PLAX 2D LVIDd:         5.00 cm   Diastology LVIDs:         3.40 cm   LV e' medial:    5.11 cm/s LV PW:         2.00 cm   LV E/e' medial:  17.5 LV IVS:        2.20 cm   LV e' lateral:   4.03 cm/s LVOT diam:     2.10 cm   LV E/e' lateral: 22.1 LV SV:         105 LV SV Index:   46 LVOT Area:     3.46 cm   RIGHT VENTRICLE RV S prime:     13.30 cm/s TAPSE (M-mode): 2.3 cm  LEFT ATRIUM              Index        RIGHT ATRIUM           Index LA diam:        4.60 cm  2.03 cm/m   RA Area:     25.00 cm LA Vol (A2C):   122.0 ml 53.91 ml/m  RA Volume:   89.50 ml  39.55 ml/m LA Vol (A4C):   106.0 ml 46.84 ml/m LA Biplane Vol: 121.0 ml 53.47 ml/m AORTIC VALVE AV Area (Vmax):    0.92 cm AV Area (Vmean):   0.93 cm AV Area (VTI):     1.04 cm AV Vmax:           452.00 cm/s AV Vmean:          306.200 cm/s AV VTI:            1.014 m AV Peak Grad:  81.7 mmHg AV Mean Grad:      45.0 mmHg LVOT Vmax:         120.50 cm/s LVOT Vmean:        82.200 cm/s LVOT VTI:          0.304 m LVOT/AV VTI ratio: 0.30 AI PHT:            510 msec  AORTA Ao Root diam: 4.10 cm  MITRAL VALVE MV Area (PHT): 2.37 cm     SHUNTS MV Decel Time: 320 msec     Systemic VTI:  0.30 m MV E velocity: 89.20 cm/s   Systemic Diam: 2.10 cm MV A velocity: 106.00 cm/s MV E/A ratio:  0.84  Vishnu Priya Mallipeddi Electronically signed by Winfield Rast Mallipeddi Signature Date/Time: 06/09/2023/1:35:03 PM    Final        CARDIAC MRI  MR CARDIAC MORPHOLOGY W WO CONTRAST 05/05/2023  Narrative CLINICAL DATA:  Clinical question of cardiac amyloidosis. Study assumes HCT of 42 and BSA of 2.25 m2.  EXAM: CARDIAC MRI  TECHNIQUE: The patient was scanned  on a 1.5 Tesla GE magnet. A dedicated cardiac coil was used. Functional imaging was done using Fiesta sequences. 2,3, and 4 chamber views were done to assess for RWMA's. Modified Simpson's rule using a short axis stack was used to calculate an ejection fraction on a dedicated work [employer]. The patient received 10 cc of Gadavist. After 10 minutes inversion recovery sequences were used to assess for infiltration and scar tissue. Flow quantification was performed 2 times during this examination with flow quantification performed at the levels of the ascending aorta above the valve, pulmonary artery above the valve.  CONTRAST:  10 cc  of Gadavist  FINDINGS: 1. Dilated left ventricular size, with LVEDD 55 mm, but LVEDVi 120 mL/m2.  Severe hypertrophy. Septal thickness 24 mm, lateral thickness 15 mm.  Normal left ventricular systolic function (LVEF =69%). There are no regional wall motion abnormalities. There is no LVOT obstruction.  Left ventricular parametric mapping notable for normal native T1 and T2. No post contrast T1 maps for ECV.  There is no late gadolinium enhancement in the left ventricular myocardium.  2.  Dilated right ventricular size with RVEDVI 114 mL/m2.  Normal right ventricular thickness.  Normal right ventricular systolic function (RVEF =57%). There are no regional wall motion abnormalities or aneurysms.  3. Moderate left atrial dilation. Mild right atrial dilation. Prominent crista terminals.  4. Severe dilation of the pulmonary artery, 35 mm. This can be seen in pulmonary hypertension.  Mild ascending aortic dilation 42 mm.  5. Valve assessment:  Aortic Valve: Morphology unclear. Aliasing of maximal aortic velocity; Peak velocity is greater than 3.5 m/s. 2D aortic valve area 1.25 cm2. Suboptimal flows related to aliasing. Moderate regurgitation suspected; regurgitant fraction approximately 26%.  Pulmonic Valve: No significant  regurgitation.  Tricuspid Valve: No significant regurgitation.  Mitral Valve: No significant regurgitation.  6.  Normal pericardium.  No pericardial effusion.  7. Grossly, no extracardiac findings. Recommended dedicated study if concerned for non-cardiac pathology.  IMPRESSION: 1. Study not consistent with cardiac amyloidosis. There is concentric severe hypertrophy. This can be seen in uncontrolled hypertension, prolong aortic stenosis, and in hypertrophic non obstructive cardiomyopathy. No evidence of infiltrative disease  2.  At least moderate aortic stenosis with aortic regurgitation.  3.  Normal ventricular function.  4.  Mild ascending aortic dilation.  Riley Lam MD   Electronically Signed By: Jens Som.D.  On: 05/06/2023 14:58  PYP SCAN  MYOCARDIAL AMYLOID PLANAR AND SPECT 02/11/2023  Narrative   Myocardial uptake was positive for radiotracer uptake. The visual grade of myocardial uptake relative to the ribs was Grade 1 (Myocardial uptake less than rib uptake). The quantitative H/CL ratio was 1.23. H/CL Ratio is 1.23.   Findings are equivocal (Grade 1) of cardiac ATTR amyloidosis.   Prior study not available for comparison.  ______________________________________________________________________________________________      05/04/2023: Hemoglobin 14.1; Platelets 235 05/25/2023: B Natriuretic Peptide 130.0   STS RISK CALCULATOR: Pending  NHYA CLASS: 2    ASSESSMENT AND PLAN:   1. Nonrheumatic aortic valve stenosis   2. Chronic diastolic heart failure (HCC)   3. Essential hypertension, benign   4. Hyperlipidemia LDL goal <70   5. Aortic atherosclerosis (HCC)   6. Aneurysm of ascending aorta without rupture (HCC)   7. Tobacco abuse     Aortic stenosis: The patient has developed severe symptomatic aortic valvular disease consisting of aortic stenosis and aortic regurgitation.  He is relatively young and seems to be a candidate for  either SAVR or TAVR.  I will refer the patient for coronary angiography, right heart catheterization, TAVR protocol CTA, and cardiothoracic surgical opinion.  In addition we will obtain PFTs to better assess his pulmonary function given his long history of tobacco abuse. Chronic diastolic heart failure: Likely due to aortic valvular disease.  Evaluate for TAVR versus SAVR. Hypertension: Blood pressure is well-controlled today. Hyperlipidemia: Continue atorvastatin 40 mg. Aortic atherosclerosis: Continue aspirin 81 mg and atorvastatin 40 mg. Ascending aortic aneurysm: Will evaluate again with TAVR protocol CTA.  Tobacco abuse: Will obtain PFTs to help determine the decision between TAVR versus SAVR.   I have personally reviewed the patients imaging data as summarized above.  I have reviewed the natural history of aortic stenosis with the patient and family members who are present today. We have discussed the limitations of medical therapy and the poor prognosis associated with symptomatic aortic stenosis. We have also reviewed potential treatment options, including palliative medical therapy, conventional surgical aortic valve replacement, and transcatheter aortic valve replacement. We discussed treatment options in the context of this patient's specific comorbid medical conditions.   All of the patient's questions were answered today. Will make further recommendations based on the results of studies outlined above.   I spent 55 minutes reviewing all clinical data during and prior to this visit including all relevant imaging studies, laboratories, clinical information from other health systems and prior notes from both Cardiology and other specialties, interviewing the patient, conducting a complete physical examination, and coordinating care in order to formulate a comprehensive and personalized evaluation and treatment plan.   [name], MD  06/11/2023 12:55 PM    [facility]  [facility] [address] [place], [place], [place]  [address] Phone: [phone]; Fax: [phone]

## 2023-06-07 NOTE — Progress Notes (Signed)
 Patient ID: Stephen Koch MRN: [id number] DOB/AGE: [birth year] 63 y.o.  Primary Care Physician:Hill, Earvin Hansen, MD Primary Cardiologist: Mallipeddi  CC:  Aortic valvular disease management  FOCUSED CARDIOVASCULAR PROBLEM LIST:   AS AVA 0.83, MG 47, V-max 4.8: EF 60 to 65% TTE 2024 EKG sinus rhythm without bundle-branch blocks AR Moderate, CMR 2025 Diastolic dysfunction Severe LVH CMR 2025 Hypertension Hyperlipidemia Aortic atherosclerosis Chest CT 2024 Ascending thoracic aneurysm 4.4 cm chest CT 2024 Ongoing tobacco abuse BMI 26/BSA 2.2   HISTORY OF PRESENT ILLNESS: The patient is a 63 y.o. male with the indicated medical history here for recommendations regarding his aortic valvular disease.  He was seen by his primary cardiologist recently with reports of dyspnea.  The patient is a longtime smoker and does not have PFTs in our system.  On review of his chart he has been evaluated with the CMR to assess for amyloid due to severe LVH on echocardiogram.  This was negative.  His echo and CMR did demonstrate significant aortic valvular disease with AAS and AR.  Additionally he does have a 4.4 cm ascending thoracic aneurysm on a CT done last year.  Due to ongoing dyspnea and aortic valvular disease he is here for further recommendations.  The patient is a very pleasant retired gentleman.  He has noticed over the last year or so that he has become more short of breath and fatigue.  He has noticed occasional chest discomfort and tightness as well.  This does not seem to happen at rest.  He has a long history of smoking and occasionally notices some wheezing with exertion.  He denies any signs or symptoms of heart failure however.  He has had no presyncope or syncope.  He denies a history of stroke or heart attack.  He fortunately does not required any emergency room visits or hospitalizations.  He has partial dentures and no dental or oral issues.  Past Medical History:  Diagnosis Date    Arthritis    Chronic back pain    Forklift injury   Essential hypertension, benign    GERD (gastroesophageal reflux disease)    History of medication noncompliance     Past Surgical History:  Procedure Laterality Date   BIOPSY  01/04/2019   Procedure: BIOPSY;  Surgeon: West Bali, MD;  [facility]: [facility];  Service: Endoscopy;;  gastric   COLONOSCOPY N/A 07/06/2012   WUJ:WJXBJY mucosa in the terminal ileum/Moderate sized internal hemorrhoids   COLONOSCOPY WITH PROPOFOL N/A 01/04/2019   normal TI, internal hemorrhoids Grade 3 s/p band placement X 3.    ESOPHAGOGASTRODUODENOSCOPY N/A 04/08/2013   NWG:NFAOZ gastric ulcer/duodenal inflammation. bx with chronic active gastritis with focal intestinal metaplasia, ulceration and H pylori   ESOPHAGOGASTRODUODENOSCOPY (EGD) WITH PROPOFOL N/A 01/04/2019   moderate gastritis s/p biopsy, negative Hpylori   HEMORRHOID BANDING N/A 01/04/2019   Procedure: HEMORRHOID BANDING;  Surgeon: West Bali, MD;  Location: [facility];  Service: Endoscopy;  Laterality: N/A;   Left knee surgery Left    arthroscopy   QUADRICEPS TENDON REPAIR Left 03/05/2017   Procedure: REPAIR QUADRICEP TENDON;  Surgeon: Vickki Hearing, MD;  Location: [facility];  Service: Orthopedics;  Laterality: Left;   Right arm surgery     tendon repair    Family History  Problem Relation Age of Onset   Heart disease Father    Colon cancer Neg Hx    Colon polyps Neg Hx     Social History   Socioeconomic  History   Marital status: Married    Spouse name: Not on file   Number of children: 4   Years of education: Not on file   Highest education level: Not on file  Occupational History   Occupation: Unemployed; previously warehouse work  Tobacco Use   Smoking status: Every Day    Types: Cigars   Smokeless tobacco: Never   Tobacco comments:    smokes about 6-7 black and milds per day   Vaping Use   Vaping status: Never Used  Substance and Sexual Activity   Alcohol  use: Yes    Alcohol/week: 10.0 standard drinks of alcohol    Types: 10 Cans of beer per week   Drug use: Yes    Types: Marijuana    Comment: Marijuana 2 times per week   Sexual activity: Yes    Birth control/protection: None  Other Topics Concern   Not on file  Social History Narrative   Lives w/ grandma   Social Drivers of Health   Financial Resource Strain: Not on file  Food Insecurity: Not on file  Transportation Needs: Not on file  Physical Activity: Not on file  Stress: Not on file  Social Connections: Not on file  Intimate Partner Violence: Not on file     Prior to Admission medications   Medication Sig Start Date End Date Taking? Authorizing Provider  amLODipine (NORVASC) 10 MG tablet Take 1 tablet (10 mg total) by mouth daily. 12/10/22 03/10/23  Mallipeddi, Orion Modest, MD  aspirin EC 81 MG tablet Take 1 tablet (81 mg total) by mouth daily. Swallow whole. 12/10/22   Mallipeddi, Vishnu P, MD  atorvastatin (LIPITOR) 40 MG tablet Take 40 mg by mouth daily. 12/08/22   [provider]  gabapentin (NEURONTIN) 300 MG capsule Take 300 mg by mouth 2 (two) times daily.  05/11/19   [provider]  methocarbamol (ROBAXIN) 500 MG tablet Take 500 mg by mouth 2 (two) times daily. 04/14/19   [provider]  Multiple Vitamin (MULTIVITAMIN WITH MINERALS) TABS tablet Take 1 tablet by mouth daily.    [provider]  omeprazole (PRILOSEC) 20 MG capsule Take 1 capsule (20 mg total) by mouth daily. TAKE ONE CAPSULE BY MOUTH BEFORE BREAKFAST May take additional capsule before supper if needed 01/15/23 01/15/24  Lanelle Bal, DO  tamsulosin (FLOMAX) 0.4 MG CAPS capsule Take 0.4 mg by mouth daily. 11/11/21   [provider]    Allergies  Allergen Reactions   Ibuprofen Other (See Comments)    Causes gastric bleeding    REVIEW OF SYSTEMS:  General: no fevers/chills/night sweats Eyes: no blurry vision, diplopia, or amaurosis ENT: no sore throat or  hearing loss Resp: no cough, wheezing, or hemoptysis CV: no edema or palpitations GI: no abdominal pain, nausea, vomiting, diarrhea, or constipation GU: no dysuria, frequency, or hematuria Skin: no rash Neuro: no headache, numbness, tingling, or weakness of extremities Musculoskeletal: no joint pain or swelling Heme: no bleeding, DVT, or easy bruising Endo: no polydipsia or polyuria  BP 134/70   Pulse 84   Ht 6\' 3"  (1.905 m)   Wt 209 lb (94.8 kg)   SpO2 94%   BMI 26.12 kg/m   PHYSICAL EXAM: GEN:  AO x 3 in no acute distress HEENT: normal Dentition: Normal Neck: JVP normal. +2 carotid upstrokes without bruits. No thyromegaly. Lungs: equal expansion, clear bilaterally CV: Apex is discrete and nondisplaced, RRR with a 3/6 crescendo decrescendo murmur Abd: soft, non-tender, non-distended;  no bruit; positive bowel sounds Ext: no edema, ecchymoses, or cyanosis Vascular: 2+ femoral pulses, 2+ radial pulses       Skin: warm and dry without rash Neuro: CN II-XII grossly intact; motor and sensory grossly intact    DATA AND STUDIES:  EKG: EKG with sinus rhythm LVH and possible inferior infarction pattern January 2025  EKG Interpretation Date/Time:    Ventricular Rate:    PR Interval:    QRS Duration:    QT Interval:    QTC Calculation:   R Axis:      Text Interpretation:          Cardiac Studies & Procedures   ______________________________________________________________________________________________     ECHOCARDIOGRAM  ECHOCARDIOGRAM COMPLETE 06/09/2023  Narrative ECHOCARDIOGRAM REPORT    Patient Name:   Stephen Koch Date of Exam: 06/09/2023 Medical Rec #:  [id number]        Height:       75.0 in Accession #:    [id number]       Weight:       215.0 lb Date of Birth:  November 30, 1960        BSA:          2.263 m Patient Age:    63 years         BP:           184/89 mmHg Patient Gender: M                HR:           69 bpm. [facility]  Procedure: 2D Echo, Cardiac Doppler and Color Doppler  Indications:    Aortic Stenosis l35.0  History:        Patient has prior history of Echocardiogram examinations, most recent 01/13/2023. Signs/Symptoms:Murmur; Risk Factors:Hypertension. ETOH and Nicotine abuse.  Sonographer:    Celesta Gentile RCS Referring Phys: [id number] VISHNU P MALLIPEDDI  IMPRESSIONS   1. Left ventricular ejection fraction, by estimation, is 60 to 65%. The left ventricle has normal function. The left ventricle has no regional wall motion abnormalities. There is severe asymmetric left ventricular hypertrophy of the septal segment. Left ventricular diastolic parameters are consistent with Grade I diastolic dysfunction (impaired relaxation). Elevated left ventricular end-diastolic pressure. 2. Right ventricular systolic function is normal. The right ventricular size is normal. Tricuspid regurgitation signal is inadequate for assessing PA pressure. 3. Left atrial size was moderately dilated. 4. Right atrial size was moderately dilated. 5. The mitral valve is normal in structure. Trivial mitral valve regurgitation. No evidence of mitral stenosis. 6. The aortic valve is tricuspid. There is mild restriction in the mobility of aortic valve leaflets. There is severe calcifcation of the aortic valve. Aortic valve regurgitation is mild to moderate, eccentric jet, visually but mild in severity based on PHT. Severe aortic valve stenosis based on velcoities but mild aortic valve stenosis based on aortic valve planimetry which is 1.8 cm2. Aortic regurgitation PHT measures 510 msec. Aortic valve area, by VTI measures 1.04 cm. Aortic valve mean gradient measures 45.0 mmHg. Aortic valve Vmax measures 4.52 m/s. DVI is 0.25. 7. Aortic dilatation noted. There is mild dilatation of the aortic root, measuring 41 mm. 8. The inferior vena cava is normal in size with greater than 50% respiratory variability, suggesting right atrial  pressure of 3 mmHg. 9. Increased flow velocities may be secondary to anemia, thyrotoxicosis, hyperdynamic or high flow state.  Comparison(s): No significant change from prior study.  FINDINGS Left  Ventricle: Left ventricular ejection fraction, by estimation, is 60 to 65%. The left ventricle has normal function. The left ventricle has no regional wall motion abnormalities. The left ventricular internal cavity size was normal in size. There is severe asymmetric left ventricular hypertrophy of the septal segment. Left ventricular diastolic parameters are consistent with Grade I diastolic dysfunction (impaired relaxation). Elevated left ventricular end-diastolic pressure.  Right Ventricle: The right ventricular size is normal. No increase in right ventricular wall thickness. Right ventricular systolic function is normal. Tricuspid regurgitation signal is inadequate for assessing PA pressure.  Left Atrium: Left atrial size was moderately dilated.  Right Atrium: Right atrial size was moderately dilated.  Pericardium: There is no evidence of pericardial effusion.  Mitral Valve: The mitral valve is normal in structure. Trivial mitral valve regurgitation. No evidence of mitral valve stenosis.  Tricuspid Valve: The tricuspid valve is normal in structure. Tricuspid valve regurgitation is not demonstrated. No evidence of tricuspid stenosis.  Aortic Valve: The aortic valve is tricuspid. There is severe calcifcation of the aortic valve. Aortic valve regurgitation is mild to moderate. Aortic regurgitation PHT measures 510 msec. Severe aortic stenosis is present. Aortic valve mean gradient measures 45.0 mmHg. Aortic valve peak gradient measures 81.7 mmHg. Aortic valve area, by VTI measures 1.04 cm.  Pulmonic Valve: The pulmonic valve was normal in structure. Pulmonic valve regurgitation is not visualized. No evidence of pulmonic stenosis.  Aorta: Aortic dilatation noted. There is mild dilatation of the  aortic root, measuring 41 mm.  Venous: The inferior vena cava is normal in size with greater than 50% respiratory variability, suggesting right atrial pressure of 3 mmHg.  IAS/Shunts: No atrial level shunt detected by color flow Doppler.   LEFT VENTRICLE PLAX 2D LVIDd:         5.00 cm   Diastology LVIDs:         3.40 cm   LV e' medial:    5.11 cm/s LV PW:         2.00 cm   LV E/e' medial:  17.5 LV IVS:        2.20 cm   LV e' lateral:   4.03 cm/s LVOT diam:     2.10 cm   LV E/e' lateral: 22.1 LV SV:         105 LV SV Index:   46 LVOT Area:     3.46 cm   RIGHT VENTRICLE RV S prime:     13.30 cm/s TAPSE (M-mode): 2.3 cm  LEFT ATRIUM              Index        RIGHT ATRIUM           Index LA diam:        4.60 cm  2.03 cm/m   RA Area:     25.00 cm LA Vol (A2C):   122.0 ml 53.91 ml/m  RA Volume:   89.50 ml  39.55 ml/m LA Vol (A4C):   106.0 ml 46.84 ml/m LA Biplane Vol: 121.0 ml 53.47 ml/m AORTIC VALVE AV Area (Vmax):    0.92 cm AV Area (Vmean):   0.93 cm AV Area (VTI):     1.04 cm AV Vmax:           452.00 cm/s AV Vmean:          306.200 cm/s AV VTI:            1.014 m AV Peak Grad:  81.7 mmHg AV Mean Grad:      45.0 mmHg LVOT Vmax:         120.50 cm/s LVOT Vmean:        82.200 cm/s LVOT VTI:          0.304 m LVOT/AV VTI ratio: 0.30 AI PHT:            510 msec  AORTA Ao Root diam: 4.10 cm  MITRAL VALVE MV Area (PHT): 2.37 cm     SHUNTS MV Decel Time: 320 msec     Systemic VTI:  0.30 m MV E velocity: 89.20 cm/s   Systemic Diam: 2.10 cm MV A velocity: 106.00 cm/s MV E/A ratio:  0.84  Vishnu Priya Mallipeddi Electronically signed by Winfield Rast Mallipeddi Signature Date/Time: 06/09/2023/1:35:03 PM    Final        CARDIAC MRI  MR CARDIAC MORPHOLOGY W WO CONTRAST 05/05/2023  Narrative CLINICAL DATA:  Clinical question of cardiac amyloidosis. Study assumes HCT of 42 and BSA of 2.25 m2.  EXAM: CARDIAC MRI  TECHNIQUE: The patient was scanned  on a 1.5 Tesla GE magnet. A dedicated cardiac coil was used. Functional imaging was done using Fiesta sequences. 2,3, and 4 chamber views were done to assess for RWMA's. Modified Simpson's rule using a short axis stack was used to calculate an ejection fraction on a dedicated work [employer]. The patient received 10 cc of Gadavist. After 10 minutes inversion recovery sequences were used to assess for infiltration and scar tissue. Flow quantification was performed 2 times during this examination with flow quantification performed at the levels of the ascending aorta above the valve, pulmonary artery above the valve.  CONTRAST:  10 cc  of Gadavist  FINDINGS: 1. Dilated left ventricular size, with LVEDD 55 mm, but LVEDVi 120 mL/m2.  Severe hypertrophy. Septal thickness 24 mm, lateral thickness 15 mm.  Normal left ventricular systolic function (LVEF =69%). There are no regional wall motion abnormalities. There is no LVOT obstruction.  Left ventricular parametric mapping notable for normal native T1 and T2. No post contrast T1 maps for ECV.  There is no late gadolinium enhancement in the left ventricular myocardium.  2.  Dilated right ventricular size with RVEDVI 114 mL/m2.  Normal right ventricular thickness.  Normal right ventricular systolic function (RVEF =57%). There are no regional wall motion abnormalities or aneurysms.  3. Moderate left atrial dilation. Mild right atrial dilation. Prominent crista terminals.  4. Severe dilation of the pulmonary artery, 35 mm. This can be seen in pulmonary hypertension.  Mild ascending aortic dilation 42 mm.  5. Valve assessment:  Aortic Valve: Morphology unclear. Aliasing of maximal aortic velocity; Peak velocity is greater than 3.5 m/s. 2D aortic valve area 1.25 cm2. Suboptimal flows related to aliasing. Moderate regurgitation suspected; regurgitant fraction approximately 26%.  Pulmonic Valve: No significant  regurgitation.  Tricuspid Valve: No significant regurgitation.  Mitral Valve: No significant regurgitation.  6.  Normal pericardium.  No pericardial effusion.  7. Grossly, no extracardiac findings. Recommended dedicated study if concerned for non-cardiac pathology.  IMPRESSION: 1. Study not consistent with cardiac amyloidosis. There is concentric severe hypertrophy. This can be seen in uncontrolled hypertension, prolong aortic stenosis, and in hypertrophic non obstructive cardiomyopathy. No evidence of infiltrative disease  2.  At least moderate aortic stenosis with aortic regurgitation.  3.  Normal ventricular function.  4.  Mild ascending aortic dilation.  Riley Lam MD   Electronically Signed By: Jens Som.D.  On: 05/06/2023 14:58  PYP SCAN  MYOCARDIAL AMYLOID PLANAR AND SPECT 02/11/2023  Narrative   Myocardial uptake was positive for radiotracer uptake. The visual grade of myocardial uptake relative to the ribs was Grade 1 (Myocardial uptake less than rib uptake). The quantitative H/CL ratio was 1.23. H/CL Ratio is 1.23.   Findings are equivocal (Grade 1) of cardiac ATTR amyloidosis.   Prior study not available for comparison.  ______________________________________________________________________________________________      05/04/2023: Hemoglobin 14.1; Platelets 235 05/25/2023: B Natriuretic Peptide 130.0   STS RISK CALCULATOR: Pending  NHYA CLASS: 2    ASSESSMENT AND PLAN:   1. Nonrheumatic aortic valve stenosis   2. Chronic diastolic heart failure (HCC)   3. Essential hypertension, benign   4. Hyperlipidemia LDL goal <70   5. Aortic atherosclerosis (HCC)   6. Aneurysm of ascending aorta without rupture (HCC)   7. Tobacco abuse     Aortic stenosis: The patient has developed severe symptomatic aortic valvular disease consisting of aortic stenosis and aortic regurgitation.  He is relatively young and seems to be a candidate for  either SAVR or TAVR.  I will refer the patient for coronary angiography, right heart catheterization, TAVR protocol CTA, and cardiothoracic surgical opinion.  In addition we will obtain PFTs to better assess his pulmonary function given his long history of tobacco abuse. Chronic diastolic heart failure: Likely due to aortic valvular disease.  Evaluate for TAVR versus SAVR. Hypertension: Blood pressure is well-controlled today. Hyperlipidemia: Continue atorvastatin 40 mg. Aortic atherosclerosis: Continue aspirin 81 mg and atorvastatin 40 mg. Ascending aortic aneurysm: Will evaluate again with TAVR protocol CTA.  Tobacco abuse: Will obtain PFTs to help determine the decision between TAVR versus SAVR.   I have personally reviewed the patients imaging data as summarized above.  I have reviewed the natural history of aortic stenosis with the patient and family members who are present today. We have discussed the limitations of medical therapy and the poor prognosis associated with symptomatic aortic stenosis. We have also reviewed potential treatment options, including palliative medical therapy, conventional surgical aortic valve replacement, and transcatheter aortic valve replacement. We discussed treatment options in the context of this patient's specific comorbid medical conditions.   All of the patient's questions were answered today. Will make further recommendations based on the results of studies outlined above.   I spent 55 minutes reviewing all clinical data during and prior to this visit including all relevant imaging studies, laboratories, clinical information from other health systems and prior notes from both Cardiology and other specialties, interviewing the patient, conducting a complete physical examination, and coordinating care in order to formulate a comprehensive and personalized evaluation and treatment plan.   [name], MD  06/11/2023 12:55 PM    [facility]  [facility] [address] [place], [place], [place]  [address] Phone: [phone]; Fax: [phone]

## 2023-06-09 ENCOUNTER — Ambulatory Visit (HOSPITAL_COMMUNITY)
Admission: RE | Admit: 2023-06-09 | Discharge: 2023-06-09 | Disposition: A | Discharge status: Home / Self Care | Payer: 59 | Source: Ambulatory Visit | Attending: Internal Medicine | Admitting: Internal Medicine

## 2023-06-09 DIAGNOSIS — I350 Nonrheumatic aortic (valve) stenosis: Secondary | ICD-10-CM | POA: Diagnosis not present

## 2023-06-09 LAB — ECHOCARDIOGRAM COMPLETE
AR max vel: 0.92 cm2
AV Area VTI: 1.04 cm2
AV Area mean vel: 0.93 cm2
AV Mean grad: 45 mm[Hg]
AV Peak grad: 81.7 mm[Hg]
Ao pk vel: 4.52 m/s
Area-P 1/2: 2.37 cm2
P 1/2 time: 510 ms
S' Lateral: 3.4 cm

## 2023-06-09 NOTE — Progress Notes (Signed)
*  PRELIMINARY RESULTS* Echocardiogram 2D Echocardiogram has been performed.  Stacey Drain 06/09/2023, 12:35 PM

## 2023-06-11 ENCOUNTER — Ambulatory Visit: Payer: 59 | Attending: Internal Medicine | Admitting: Internal Medicine

## 2023-06-11 ENCOUNTER — Encounter: Payer: Self-pay | Admitting: Internal Medicine

## 2023-06-11 ENCOUNTER — Telehealth: Payer: Self-pay | Admitting: *Deleted

## 2023-06-11 VITALS — BP 134/70 | HR 84 | Ht 75.0 in | Wt 209.0 lb

## 2023-06-11 DIAGNOSIS — I7121 Aneurysm of the ascending aorta, without rupture: Secondary | ICD-10-CM

## 2023-06-11 DIAGNOSIS — E785 Hyperlipidemia, unspecified: Secondary | ICD-10-CM

## 2023-06-11 DIAGNOSIS — I350 Nonrheumatic aortic (valve) stenosis: Secondary | ICD-10-CM | POA: Diagnosis not present

## 2023-06-11 DIAGNOSIS — I5032 Chronic diastolic (congestive) heart failure: Secondary | ICD-10-CM | POA: Diagnosis not present

## 2023-06-11 DIAGNOSIS — Z720 Tobacco use: Secondary | ICD-10-CM

## 2023-06-11 DIAGNOSIS — I10 Essential (primary) hypertension: Secondary | ICD-10-CM | POA: Diagnosis not present

## 2023-06-11 DIAGNOSIS — I700 Atherosclerosis of aorta: Secondary | ICD-10-CM | POA: Diagnosis not present

## 2023-06-11 NOTE — Patient Instructions (Signed)
Medication Instructions:  No changes *If you need a refill on your cardiac medications before your next appointment, please call your pharmacy*   Lab Work: Go to [facility] for blood work (cbc, bmet)   Testing/Procedures: Your physician has requested that you have a cardiac catheterization. Cardiac catheterization is used to diagnose and/or treat various heart conditions. Doctors may recommend this procedure for a number of different reasons. The most common reason is to evaluate chest pain. Chest pain can be a symptom of coronary artery disease (CAD), and cardiac catheterization can show whether plaque is narrowing or blocking your heart's arteries. This procedure is also used to evaluate the valves, as well as measure the blood flow and oxygen levels in different parts of your heart. For further information please visit [email or web address] Please follow instruction sheet, as given.  Your physician has recommended that you have a pulmonary function test. Pulmonary Function Tests are a group of tests that measure how well air moves in and out of your lungs.   Follow-Up: Per Structural Heart Team         Cardiac/Peripheral Catheterization   You are scheduled for a Cardiac Catheterization on Thursday, February 20 with Dr. Alverda Skeans.  1. Please arrive at the [facility] (Main Entrance A) at [facility]: [address] [place], [place] [address] at 8:30 AM (This time is TWO  hour(s) before your procedure to ensure your preparation).   Free valet parking service is available. You will check in at ADMITTING. The support person will be asked to wait in the waiting room.  It is OK to have someone drop you off and come back when you are ready to be discharged.        Special note: Every effort is made to have your procedure done on time. Please understand that emergencies sometimes delay scheduled procedures.  2. Diet: Do not eat solid foods after midnight.  You may have clear  liquids until 5 AM the day of the procedure.  3. Labs: You will need to have blood drawn.  You do not need to be fasting. GO TO [facility] TODAY OR TOMORROW  4. Medication instructions in preparation for your procedure:   Contrast Allergy: No  On the morning of your procedure, take Aspirin 81 mg and any morning medicines NOT listed above.  You may use sips of water.  5. Plan to go home the same day, you will only stay overnight if medically necessary. 6. You MUST have a responsible adult to drive you home. 7. An adult MUST be with you the first 24 hours after you arrive home. 8. Bring a current list of your medications, and the last time and date medication taken. 9. Bring ID and current insurance cards. 10.Please wear clothes that are easy to get on and off and wear slip-on shoes.  Thank you for allowing [place] to care for you!   -- [facility]

## 2023-06-11 NOTE — Telephone Encounter (Signed)
Called patient to change cath/arrival time for Thursday 06/18/23.  Patient will arrive 06/18/23 5:30 AM for 7:30 AM cath-pt aware to follow all other instructions given to him earlier today.

## 2023-06-11 NOTE — Progress Notes (Addendum)
 Pre Surgical Assessment: 5 M Walk Test  92M=16.31ft  5 Meter Walk Test- trial 1: 10.60 seconds 5 Meter Walk Test- trial 2: 10.92 seconds 5 Meter Walk Test- trial 3: 8.0 seconds 5 Meter Walk Test Average: 9.84 seconds    _________________________  Procedure Type: Isolated AVR Perioperative Outcome Estimate % Operative Mortality 0.684% Morbidity & Mortality 3.83% Stroke 0.776% Renal Failure 0.498% Reoperation 2.97% Prolonged Ventilation 1.57% Deep Sternal Wound Infection 0.049% Long Hospital Stay (>14 days) 1.31% Short Hospital Stay (<6 days)* 64.9%

## 2023-06-12 LAB — CBC
Hematocrit: 43.9 % (ref 37.5–51.0)
Hemoglobin: 14.8 g/dL (ref 13.0–17.7)
MCH: 30.6 pg (ref 26.6–33.0)
MCHC: 33.7 g/dL (ref 31.5–35.7)
MCV: 91 fL (ref 79–97)
Platelets: 261 10*3/uL (ref 150–450)
RBC: 4.84 x10E6/uL (ref 4.14–5.80)
RDW: 13.2 % (ref 11.6–15.4)
WBC: 7.9 10*3/uL (ref 3.4–10.8)

## 2023-06-12 LAB — BASIC METABOLIC PANEL
BUN/Creatinine Ratio: 18 (ref 10–24)
BUN: 15 mg/dL (ref 8–27)
CO2: 27 mmol/L (ref 20–29)
Calcium: 9.6 mg/dL (ref 8.6–10.2)
Chloride: 102 mmol/L (ref 96–106)
Creatinine, Ser: 0.84 mg/dL (ref 0.76–1.27)
Glucose: 90 mg/dL (ref 70–99)
Potassium: 3.9 mmol/L (ref 3.5–5.2)
Sodium: 141 mmol/L (ref 134–144)
eGFR: 97 mL/min/{1.73_m2} (ref >59)

## 2023-06-17 ENCOUNTER — Ambulatory Visit: Payer: 59 | Admitting: Internal Medicine

## 2023-06-18 ENCOUNTER — Other Ambulatory Visit: Payer: Self-pay | Admitting: Physician Assistant

## 2023-06-18 ENCOUNTER — Other Ambulatory Visit: Payer: Self-pay

## 2023-06-18 ENCOUNTER — Encounter (HOSPITAL_COMMUNITY)
Admission: RE | Disposition: A | Discharge status: Home / Self Care | Payer: Self-pay | Source: Ambulatory Visit | Attending: Internal Medicine

## 2023-06-18 ENCOUNTER — Encounter: Payer: Self-pay | Admitting: Physician Assistant

## 2023-06-18 ENCOUNTER — Other Ambulatory Visit (HOSPITAL_COMMUNITY): Payer: Self-pay

## 2023-06-18 ENCOUNTER — Encounter (HOSPITAL_COMMUNITY): Payer: Self-pay | Admitting: Internal Medicine

## 2023-06-18 ENCOUNTER — Ambulatory Visit (HOSPITAL_COMMUNITY)
Admission: RE | Admit: 2023-06-18 | Discharge: 2023-06-18 | Disposition: A | Discharge status: Home / Self Care | Payer: 59 | Source: Ambulatory Visit | Attending: Internal Medicine | Admitting: Internal Medicine

## 2023-06-18 DIAGNOSIS — Z79899 Other long term (current) drug therapy: Secondary | ICD-10-CM | POA: Insufficient documentation

## 2023-06-18 DIAGNOSIS — I700 Atherosclerosis of aorta: Secondary | ICD-10-CM | POA: Diagnosis not present

## 2023-06-18 DIAGNOSIS — I5032 Chronic diastolic (congestive) heart failure: Secondary | ICD-10-CM | POA: Diagnosis not present

## 2023-06-18 DIAGNOSIS — I350 Nonrheumatic aortic (valve) stenosis: Secondary | ICD-10-CM

## 2023-06-18 DIAGNOSIS — I110 Hypertensive heart disease with heart failure: Secondary | ICD-10-CM | POA: Diagnosis not present

## 2023-06-18 DIAGNOSIS — F17290 Nicotine dependence, other tobacco product, uncomplicated: Secondary | ICD-10-CM | POA: Diagnosis not present

## 2023-06-18 DIAGNOSIS — E785 Hyperlipidemia, unspecified: Secondary | ICD-10-CM | POA: Diagnosis not present

## 2023-06-18 DIAGNOSIS — I2510 Atherosclerotic heart disease of native coronary artery without angina pectoris: Secondary | ICD-10-CM | POA: Insufficient documentation

## 2023-06-18 DIAGNOSIS — I7121 Aneurysm of the ascending aorta, without rupture: Secondary | ICD-10-CM | POA: Diagnosis not present

## 2023-06-18 HISTORY — PX: RIGHT HEART CATH AND CORONARY ANGIOGRAPHY: CATH118264

## 2023-06-18 HISTORY — PX: ABDOMINAL AORTOGRAM: CATH118222

## 2023-06-18 LAB — POCT I-STAT EG7
Acid-Base Excess: 0 mmol/L (ref 0.0–2.0)
Acid-Base Excess: 2 mmol/L (ref 0.0–2.0)
Bicarbonate: 26.3 mmol/L (ref 20.0–28.0)
Bicarbonate: 27.8 mmol/L (ref 20.0–28.0)
Calcium, Ion: 1.13 mmol/L — ABNORMAL LOW (ref 1.15–1.40)
Calcium, Ion: 1.2 mmol/L (ref 1.15–1.40)
HCT: 39 % (ref 39.0–52.0)
HCT: 40 % (ref 39.0–52.0)
Hemoglobin: 13.3 g/dL (ref 13.0–17.0)
Hemoglobin: 13.6 g/dL (ref 13.0–17.0)
O2 Saturation: 65 %
O2 Saturation: 78 %
Potassium: 3.4 mmol/L — ABNORMAL LOW (ref 3.5–5.1)
Potassium: 3.6 mmol/L (ref 3.5–5.1)
Sodium: 142 mmol/L (ref 135–145)
Sodium: 143 mmol/L (ref 135–145)
TCO2: 28 mmol/L (ref 22–32)
TCO2: 29 mmol/L (ref 22–32)
pCO2, Ven: 47.7 mm[Hg] (ref 44–60)
pCO2, Ven: 48.9 mm[Hg] (ref 44–60)
pH, Ven: 7.349 (ref 7.25–7.43)
pH, Ven: 7.363 (ref 7.25–7.43)
pO2, Ven: 36 mm[Hg] (ref 32–45)
pO2, Ven: 44 mm[Hg] (ref 32–45)

## 2023-06-18 LAB — POCT I-STAT 7, (LYTES, BLD GAS, ICA,H+H)
Acid-Base Excess: 0 mmol/L (ref 0.0–2.0)
Bicarbonate: 25.7 mmol/L (ref 20.0–28.0)
Calcium, Ion: 1.14 mmol/L — ABNORMAL LOW (ref 1.15–1.40)
HCT: 40 % (ref 39.0–52.0)
Hemoglobin: 13.6 g/dL (ref 13.0–17.0)
O2 Saturation: 91 %
Potassium: 3.5 mmol/L (ref 3.5–5.1)
Sodium: 144 mmol/L (ref 135–145)
TCO2: 27 mmol/L (ref 22–32)
pCO2 arterial: 43.4 mm[Hg] (ref 32–48)
pH, Arterial: 7.381 (ref 7.35–7.45)
pO2, Arterial: 62 mm[Hg] — ABNORMAL LOW (ref 83–108)

## 2023-06-18 SURGERY — RIGHT HEART CATH AND CORONARY ANGIOGRAPHY
Anesthesia: LOCAL

## 2023-06-18 MED ORDER — VERAPAMIL HCL 2.5 MG/ML IV SOLN
INTRAVENOUS | Status: AC
  Filled 2023-06-18: qty 2

## 2023-06-18 MED ORDER — HEPARIN SODIUM (PORCINE) 1000 UNIT/ML IJ SOLN
INTRAMUSCULAR | Status: AC
  Filled 2023-06-18: qty 10

## 2023-06-18 MED ORDER — SODIUM CHLORIDE 0.9 % WEIGHT BASED INFUSION: 1.0000 mL/kg/h | INTRAVENOUS | Status: DC

## 2023-06-18 MED ORDER — FENTANYL CITRATE (PF) 100 MCG/2ML IJ SOLN
INTRAMUSCULAR | Status: AC
  Filled 2023-06-18: qty 2

## 2023-06-18 MED ORDER — SODIUM CHLORIDE 0.9% FLUSH: 3.0000 mL | INTRAVENOUS | Status: DC | PRN

## 2023-06-18 MED ORDER — HYDRALAZINE HCL 20 MG/ML IJ SOLN: 10.0000 mg | INTRAMUSCULAR | Status: DC | PRN

## 2023-06-18 MED ORDER — IOHEXOL 350 MG/ML SOLN
INTRAVENOUS | Status: DC | PRN
  Administered 2023-06-18: 105 mL

## 2023-06-18 MED ORDER — ASPIRIN 81 MG PO CHEW: 81.0000 mg | CHEWABLE_TABLET | ORAL | Status: DC

## 2023-06-18 MED ORDER — LIDOCAINE HCL (PF) 1 % IJ SOLN
INTRAMUSCULAR | Status: AC
  Filled 2023-06-18: qty 30

## 2023-06-18 MED ORDER — VERAPAMIL HCL 2.5 MG/ML IV SOLN
INTRAVENOUS | Status: DC | PRN
  Administered 2023-06-18: 10 mL via INTRA_ARTERIAL

## 2023-06-18 MED ORDER — ONDANSETRON HCL 4 MG/2ML IJ SOLN: 4.0000 mg | Freq: Four times a day (QID) | INTRAMUSCULAR | Status: DC | PRN

## 2023-06-18 MED ORDER — SODIUM CHLORIDE 0.9% FLUSH
3.0000 mL | Freq: Two times a day (BID) | INTRAVENOUS | Status: DC
Start: 2023-06-18 — End: 2023-06-18

## 2023-06-18 MED ORDER — HEPARIN SODIUM (PORCINE) 1000 UNIT/ML IJ SOLN
INTRAMUSCULAR | Status: DC | PRN
  Administered 2023-06-18: 5000 [IU] via INTRA_ARTERIAL

## 2023-06-18 MED ORDER — MIDAZOLAM HCL 2 MG/2ML IJ SOLN
INTRAMUSCULAR | Status: AC
  Filled 2023-06-18: qty 2

## 2023-06-18 MED ORDER — SODIUM CHLORIDE 0.9 % WEIGHT BASED INFUSION: 3.0000 mL/kg/h | INTRAVENOUS | Status: DC

## 2023-06-18 MED ORDER — LABETALOL HCL 5 MG/ML IV SOLN: 10.0000 mg | INTRAVENOUS | Status: DC | PRN

## 2023-06-18 MED ORDER — SODIUM CHLORIDE 0.9 % IV SOLN: 250.0000 mL | INTRAVENOUS | Status: DC | PRN

## 2023-06-18 MED ORDER — LIDOCAINE HCL (PF) 1 % IJ SOLN
INTRAMUSCULAR | Status: DC | PRN
  Administered 2023-06-18: 5 mL via INTRADERMAL

## 2023-06-18 MED ORDER — FENTANYL CITRATE (PF) 100 MCG/2ML IJ SOLN
INTRAMUSCULAR | Status: DC | PRN
  Administered 2023-06-18: 25 ug via INTRAVENOUS

## 2023-06-18 MED ORDER — MIDAZOLAM HCL 2 MG/2ML IJ SOLN
INTRAMUSCULAR | Status: DC | PRN
  Administered 2023-06-18: 1 mg via INTRAVENOUS

## 2023-06-18 MED ORDER — HEPARIN (PORCINE) IN NACL 1000-0.9 UT/500ML-% IV SOLN
INTRAVENOUS | Status: DC | PRN
  Administered 2023-06-18 (×2): 500 mL

## 2023-06-18 MED ORDER — METOPROLOL TARTRATE 25 MG PO TABS
25.0000 mg | ORAL_TABLET | ORAL | 0 refills | Status: DC
  Filled 2023-06-18: qty 1, 1d supply, fill #0

## 2023-06-18 MED ORDER — ACETAMINOPHEN 325 MG PO TABS: 650.0000 mg | ORAL_TABLET | ORAL | Status: DC | PRN

## 2023-06-18 MED ORDER — HEPARIN SODIUM (PORCINE) 1000 UNIT/ML IJ SOLN
INTRAMUSCULAR | Status: AC
Start: 2023-06-18 — End: ?
  Filled 2023-06-18: qty 10

## 2023-06-18 SURGICAL SUPPLY — 12 items
CATH BALLN WEDGE 5F 110CM (CATHETERS) IMPLANT
CATH INFINITI 5FR ANG PIGTAIL (CATHETERS) IMPLANT
CATH INFINITI AMBI 6FR TG (CATHETERS) IMPLANT
DEVICE RAD COMP TR BAND LRG (VASCULAR PRODUCTS) IMPLANT
GLIDESHEATH SLEND SS 6F .021 (SHEATH) IMPLANT
GUIDEWIRE .025 260CM (WIRE) IMPLANT
GUIDEWIRE INQWIRE 1.5J.035X260 (WIRE) IMPLANT
INQWIRE 1.5J .035X260CM (WIRE) ×1 IMPLANT
KIT SYRINGE INJ CVI SPIKEX1 (MISCELLANEOUS) IMPLANT
PACK CARDIAC CATHETERIZATION (CUSTOM PROCEDURE TRAY) ×2 IMPLANT
SET ATX-X65L (MISCELLANEOUS) IMPLANT
SHEATH GLIDE SLENDER 4/5FR (SHEATH) IMPLANT

## 2023-06-18 NOTE — Discharge Instructions (Signed)

## 2023-06-18 NOTE — Interval H&P Note (Signed)
 History and Physical Interval Note:  06/18/2023 6:44 AM  Stephen Koch  has presented today for surgery, with the diagnosis of aortic valve stenosis.  The various methods of treatment have been discussed with the patient and family. After consideration of risks, benefits and other options for treatment, the patient has consented to  Procedure(s): RIGHT/LEFT HEART CATH AND CORONARY ANGIOGRAPHY (N/A) as a surgical intervention.  The patient's history has been reviewed, patient examined, no change in status, stable for surgery.  I have reviewed the patient's chart and labs.  Questions were answered to the patient's satisfaction.     [name]

## 2023-06-30 ENCOUNTER — Ambulatory Visit (HOSPITAL_COMMUNITY)
Admission: RE | Admit: 2023-06-30 | Discharge: 2023-06-30 | Disposition: A | Discharge status: Home / Self Care | Payer: 59 | Source: Ambulatory Visit | Attending: Physician Assistant | Admitting: Physician Assistant

## 2023-06-30 DIAGNOSIS — Q272 Other congenital malformations of renal artery: Secondary | ICD-10-CM | POA: Diagnosis not present

## 2023-06-30 DIAGNOSIS — D3501 Benign neoplasm of right adrenal gland: Secondary | ICD-10-CM | POA: Diagnosis not present

## 2023-06-30 DIAGNOSIS — I7121 Aneurysm of the ascending aorta, without rupture: Secondary | ICD-10-CM | POA: Diagnosis not present

## 2023-06-30 DIAGNOSIS — I350 Nonrheumatic aortic (valve) stenosis: Secondary | ICD-10-CM | POA: Insufficient documentation

## 2023-06-30 DIAGNOSIS — Z01818 Encounter for other preprocedural examination: Secondary | ICD-10-CM | POA: Diagnosis not present

## 2023-06-30 MED ORDER — IOHEXOL 350 MG/ML SOLN
100.0000 mL | Freq: Once | INTRAVENOUS | Status: AC | PRN
  Administered 2023-06-30: 100 mL via INTRAVENOUS

## 2023-07-07 ENCOUNTER — Ambulatory Visit (HOSPITAL_COMMUNITY): Payer: 59

## 2023-07-21 ENCOUNTER — Ambulatory Visit (HOSPITAL_COMMUNITY)
Admission: RE | Admit: 2023-07-21 | Discharge: 2023-07-21 | Disposition: A | Discharge status: Home / Self Care | Payer: 59 | Source: Ambulatory Visit | Attending: Internal Medicine | Admitting: Internal Medicine

## 2023-07-21 DIAGNOSIS — R0609 Other forms of dyspnea: Secondary | ICD-10-CM | POA: Insufficient documentation

## 2023-07-21 DIAGNOSIS — F17290 Nicotine dependence, other tobacco product, uncomplicated: Secondary | ICD-10-CM | POA: Insufficient documentation

## 2023-07-21 DIAGNOSIS — I350 Nonrheumatic aortic (valve) stenosis: Secondary | ICD-10-CM | POA: Insufficient documentation

## 2023-07-21 LAB — PULMONARY FUNCTION TEST
DL/VA % pred: 121 %
DL/VA: 4.86 ml/min/mmHg/L
DLCO unc % pred: 89 %
DLCO unc: 27.45 ml/min/mmHg
FEF 25-75 Post: 2.39 L/s
FEF 25-75 Pre: 2.77 L/s
FEF2575-%Change-Post: -13 %
FEF2575-%Pred-Post: 74 %
FEF2575-%Pred-Pre: 86 %
FEV1-%Change-Post: -4 %
FEV1-%Pred-Post: 77 %
FEV1-%Pred-Pre: 81 %
FEV1-Post: 3.18 L
FEV1-Pre: 3.32 L
FEV1FVC-%Change-Post: 1 %
FEV1FVC-%Pred-Pre: 101 %
FEV6-%Change-Post: -4 %
FEV6-%Pred-Post: 79 %
FEV6-%Pred-Pre: 83 %
FEV6-Post: 4.15 L
FEV6-Pre: 4.34 L
FEV6FVC-%Change-Post: 0 %
FEV6FVC-%Pred-Post: 104 %
FEV6FVC-%Pred-Pre: 103 %
FVC-%Change-Post: -5 %
FVC-%Pred-Post: 75 %
FVC-%Pred-Pre: 80 %
FVC-Post: 4.15 L
FVC-Pre: 4.39 L
Post FEV1/FVC ratio: 76 %
Post FEV6/FVC ratio: 100 %
Pre FEV1/FVC ratio: 76 %
Pre FEV6/FVC Ratio: 99 %
RV % pred: 95 %
RV: 2.51 L
TLC % pred: 82 %
TLC: 6.64 L

## 2023-07-21 MED ORDER — ALBUTEROL SULFATE (2.5 MG/3ML) 0.083% IN NEBU
2.5000 mg | INHALATION_SOLUTION | Freq: Once | RESPIRATORY_TRACT | Status: AC
  Administered 2023-07-21: 2.5 mg via RESPIRATORY_TRACT

## 2023-07-22 NOTE — Progress Notes (Unsigned)
 [address]       [place] [address]             [phone]           IMIR BRUMBACH [facility] Medical Record #[id number] Date of Birth: 06/28/1960  Marjo Bicker, MD Mirna Mires, MD  Chief Complaint:   Increasing DOE  History of Present Illness:     63 yo male with increasing DOE. Pt is a smoker and has had PFTs without significant pathology. He has had an echo with now severe AS with a mean gradient of with a tricuspid valve and normal LV function. He has also a dilated ascending aorta which on CT is 43mm. He was evaluated by cardiology and underwent a cath with moderate PHTN but no CAD. He had TAVR CTA with acceptable anatomy for a TAVR however wth his age and ascending aortic dilation was felt best by structural heart team to have SAVR. PT has no CP or light headedness nor palpitations      Past Medical History:  Diagnosis Date   Arthritis    Chronic back pain    Forklift injury   Essential hypertension, benign    GERD (gastroesophageal reflux disease)    History of medication noncompliance     Past Surgical History:  Procedure Laterality Date   ABDOMINAL AORTOGRAM N/A 06/18/2023   Procedure: ABDOMINAL AORTOGRAM;  Surgeon: [name], MD;  Location: [facility] INVASIVE CV LAB;  Service: Cardiovascular;  Laterality: N/A;   BIOPSY  01/04/2019   Procedure: BIOPSY;  Surgeon: West Bali, MD;  Location: [facility] ENDO SUITE;  Service: Endoscopy;;  gastric   COLONOSCOPY N/A 07/06/2012   OZH:YQMVHQ mucosa in the terminal ileum/Moderate sized internal hemorrhoids   COLONOSCOPY WITH PROPOFOL N/A 01/04/2019   normal TI, internal hemorrhoids Grade 3 s/p band placement X 3.    ESOPHAGOGASTRODUODENOSCOPY N/A 04/08/2013   ION:GEXBM gastric ulcer/duodenal inflammation. bx with chronic active gastritis with focal intestinal metaplasia, ulceration and H pylori   ESOPHAGOGASTRODUODENOSCOPY (EGD) WITH PROPOFOL N/A 01/04/2019   moderate gastritis s/p biopsy,  negative Hpylori   HEMORRHOID BANDING N/A 01/04/2019   Procedure: HEMORRHOID BANDING;  Surgeon: West Bali, MD;  Location: [facility] END[facility];  Service: Endoscopy;  Laterality: N/A;   Left knee surgery Left    arthroscopy   QUADRICEPS TENDON REPAIR Left 03/05/2017   Procedure: REPAIR QUADRICEP TENDON;  Surgeon: Vickki Hearing, MD;  Location: [facility] ORS;  Service: Orthopedics;  Laterality: Left;   Right arm surgery     tendon repair   RIGHT HEART CATH AND CORONARY ANGIOGRAPHY N/A 06/18/2023   Procedure: RIGHT HEART CATH AND CORONARY ANGIOGRAPHY;  Surgeon: [name], MD;  Location: [facility] INVASIVE CV LAB;  Service: Cardiovascular;  Laterality: N/A;    Social History   Tobacco Use  Smoking Status Every Day   Types: Cigars  Smokeless Tobacco Never  Tobacco Comments   smokes about 6-7 black and milds per day     Social History   Substance and Sexual Activity  Alcohol Use Yes   Alcohol/week: 10.0 standard drinks of alcohol   Types: 10 Cans of beer per week    Social History   Socioeconomic History   Marital status: Married    Spouse name: Not on file   Number of children: 4   Years of education: Not on file   Highest education level: Not on file  Occupational History   Occupation: Unemployed;  previously warehouse work  Tobacco Use   Smoking status: Every Day    Types: Cigars   Smokeless tobacco: Never   Tobacco comments:    smokes about 6-7 black and milds per day   Vaping Use   Vaping status: Never Used  Substance and Sexual Activity   Alcohol use: Yes    Alcohol/week: 10.0 standard drinks of alcohol    Types: 10 Cans of beer per week   Drug use: Yes    Types: Marijuana    Comment: Marijuana 2 times per week   Sexual activity: Yes    Birth control/protection: None  Other Topics Concern   Not on file  Social History Narrative   Lives w/ grandma   Social Drivers of Health   Financial Resource Strain: Not on file  Food Insecurity: Not on file  Transportation  Needs: Not on file  Physical Activity: Not on file  Stress: Not on file  Social Connections: Not on file  Intimate Partner Violence: Not on file    Allergies  Allergen Reactions   Ibuprofen Other (See Comments)    Causes gastric bleeding    Current Outpatient Medications  Medication Sig Dispense Refill   amLODipine (NORVASC) 10 MG tablet Take 10 mg by mouth daily.     aspirin EC 81 MG tablet Take 1 tablet (81 mg total) by mouth daily. Swallow whole.     atorvastatin (LIPITOR) 40 MG tablet Take 40 mg by mouth daily.     gabapentin (NEURONTIN) 300 MG capsule Take 300 mg by mouth 2 (two) times daily.      methocarbamol (ROBAXIN) 500 MG tablet Take 500 mg by mouth 2 (two) times daily.     metoprolol tartrate (LOPRESSOR) 25 MG tablet Take 1 tablet (25 mg total) by mouth 2 hours prior to CT scans to slow heart rate as directed. 1 tablet 0   Multiple Vitamin (MULTIVITAMIN WITH MINERALS) TABS tablet Take 1 tablet by mouth daily.     naproxen (NAPROSYN) 500 MG tablet Take 500 mg by mouth 2 (two) times daily as needed for moderate pain (pain score 4-6).     omeprazole (PRILOSEC) 20 MG capsule Take 1 capsule (20 mg total) by mouth daily. TAKE ONE CAPSULE BY MOUTH BEFORE BREAKFAST May take additional capsule before supper if needed 90 capsule 3   tamsulosin (FLOMAX) 0.4 MG CAPS capsule Take 0.4 mg by mouth daily.     No current facility-administered medications for this visit.     Family History  Problem Relation Age of Onset   Heart disease Father    Colon cancer Neg Hx    Colon polyps Neg Hx        Physical Exam: Teeth in good repair Lungs: clear Card: RR with harsh systolic murmur Ext; no edema Neuro: intact     Diagnostic Studies & Laboratory data: I have personally reviewed the following studies and agree with the findings   TTE (05/2023) IMPRESSIONS     1. Left ventricular ejection fraction, by estimation, is 60 to 65%. The  left ventricle has normal function. The  left ventricle has no regional  wall motion abnormalities. There is severe asymmetric left ventricular  hypertrophy of the septal segment. Left   ventricular diastolic parameters are consistent with Grade I diastolic  dysfunction (impaired relaxation). Elevated left ventricular end-diastolic  pressure.   2. Right ventricular systolic function is normal. The right ventricular  size is normal. Tricuspid regurgitation signal is inadequate for assessing  PA  pressure.   3. Left atrial size was moderately dilated.   4. Right atrial size was moderately dilated.   5. The mitral valve is normal in structure. Trivial mitral valve  regurgitation. No evidence of mitral stenosis.   6. The aortic valve is tricuspid. There is mild restriction in the  mobility of aortic valve leaflets. There is severe calcifcation of the  aortic valve. Aortic valve regurgitation is mild to moderate, eccentric  jet, visually but mild in severity based on   PHT. Severe aortic valve stenosis based on velcoities but mild aortic  valve stenosis based on aortic valve planimetry which is 1.8 cm2. Aortic  regurgitation PHT measures 510 msec. Aortic valve area, by VTI measures  1.04 cm. Aortic valve mean gradient  measures 45.0 mmHg. Aortic valve Vmax measures 4.52 m/s. DVI is 0.25.   7. Aortic dilatation noted. There is mild dilatation of the aortic root,  measuring 41 mm.   8. The inferior vena cava is normal in size with greater than 50%  respiratory variability, suggesting right atrial pressure of 3 mmHg.   9. Increased flow velocities may be secondary to anemia, thyrotoxicosis,  hyperdynamic or high flow state.   Comparison(s): No significant change from prior study.   FINDINGS   Left Ventricle: Left ventricular ejection fraction, by estimation, is 60  to 65%. The left ventricle has normal function. The left ventricle has no  regional wall motion abnormalities. The left ventricular internal cavity  size was normal in  size. There is   severe asymmetric left ventricular hypertrophy of the septal segment.  Left ventricular diastolic parameters are consistent with Grade I  diastolic dysfunction (impaired relaxation). Elevated left ventricular  end-diastolic pressure.   Right Ventricle: The right ventricular size is normal. No increase in  right ventricular wall thickness. Right ventricular systolic function is  normal. Tricuspid regurgitation signal is inadequate for assessing PA  pressure.   Left Atrium: Left atrial size was moderately dilated.   Right Atrium: Right atrial size was moderately dilated.   Pericardium: There is no evidence of pericardial effusion.   Mitral Valve: The mitral valve is normal in structure. Trivial mitral  valve regurgitation. No evidence of mitral valve stenosis.   Tricuspid Valve: The tricuspid valve is normal in structure. Tricuspid  valve regurgitation is not demonstrated. No evidence of tricuspid  stenosis.   Aortic Valve: The aortic valve is tricuspid. There is severe calcifcation  of the aortic valve. Aortic valve regurgitation is mild to moderate.  Aortic regurgitation PHT measures 510 msec. Severe aortic stenosis is  present. Aortic valve mean gradient  measures 45.0 mmHg. Aortic valve peak gradient measures 81.7 mmHg. Aortic  valve area, by VTI measures 1.04 cm.   Pulmonic Valve: The pulmonic valve was normal in structure. Pulmonic valve  regurgitation is not visualized. No evidence of pulmonic stenosis.   Aorta: Aortic dilatation noted. There is mild dilatation of the aortic  root, measuring 41 mm.   Venous: The inferior vena cava is normal in size with greater than 50%  respiratory variability, suggesting right atrial pressure of 3 mmHg.   IAS/Shunts: No atrial level shunt detected by color flow Doppler.     LEFT VENTRICLE  PLAX 2D  LVIDd:         5.00 cm   Diastology  LVIDs:         3.40 cm   LV e' medial:    5.11 cm/s  LV PW:  2.00 cm    LV E/e' medial:  17.5  LV IVS:        2.20 cm   LV e' lateral:   4.03 cm/s  LVOT diam:     2.10 cm   LV E/e' lateral: 22.1  LV SV:         105  LV SV Index:   46  LVOT Area:     3.46 cm     RIGHT VENTRICLE  RV S prime:     13.30 cm/s  TAPSE (M-mode): 2.3 cm   LEFT ATRIUM              Index        RIGHT ATRIUM           Index  LA diam:        4.60 cm  2.03 cm/m   RA Area:     25.00 cm  LA Vol (A2C):   122.0 ml 53.91 ml/m  RA Volume:   89.50 ml  39.55 ml/m  LA Vol (A4C):   106.0 ml 46.84 ml/m  LA Biplane Vol: 121.0 ml 53.47 ml/m   AORTIC VALVE  AV Area (Vmax):    0.92 cm  AV Area (Vmean):   0.93 cm  AV Area (VTI):     1.04 cm  AV Vmax:           452.00 cm/s  AV Vmean:          306.200 cm/s  AV VTI:            1.014 m  AV Peak Grad:      81.7 mmHg  AV Mean Grad:      45.0 mmHg  LVOT Vmax:         120.50 cm/s  LVOT Vmean:        82.200 cm/s  LVOT VTI:          0.304 m  LVOT/AV VTI ratio: 0.30  AI PHT:            510 msec    AORTA  Ao Root diam: 4.10 cm   MITRAL VALVE  MV Area (PHT): 2.37 cm     SHUNTS  MV Decel Time: 320 msec     Systemic VTI:  0.30 m  MV E velocity: 89.20 cm/s   Systemic Diam: 2.10 cm  MV A velocity: 106.00 cm/s  MV E/A ratio:  0.84   CATH (05/2023) Conclusion  1.  Minimal nonobstructive coronary artery disease of right dominant system.  The right coronary artery has an anterior takeoff. 2.  Fick cardiac output of 11.4 L/min and Fick cardiac index of 5.1 L/min/m with the following hemodynamics:            Right atrial pressure mean of 11 mmHg            Right ventricular pressure 46/7 with an end-diastolic pressure of 16 mmHg            Wedge pressure mean of 20 mmHg with V waves to 22 mmHg            PA pressure 45/21 with a mean of 33 mmHg            PVR of 1.1 Woods units            PA pulsatility index of 2.2 3.  Capacious iliofemoral vessels bilaterally.   Recent Radiology Findings:   CTA (06/2023) FINDINGS: Image quality:  Excellent.  Noise artifact is: Limited.   Valve Morphology: Tricuspid aortic valve with severely calcified leaflets. There is restricted leaflet motion in systole. There is incomplete leaflet coaptation consistent with at least moderate aortic regurgitation.   Aortic Valve Calcium score: 2497   Aortic annular dimension:   Phase assessed: 25%   Annular area: 577 mm2   Annular perimeter: 87.8 mm2   Max diameter: 30.0 mm   Min diameter: 26.0 mm   Annular and subannular calcification: None.   Membranous septum length: 9.9 mm   Optimal coplanar projection: RAO 0 CRA 7   Coronary Artery Height above Annulus:   Left Main: 14.8 mm   Right Coronary:   Sinus of Valsalva Measurements:   Non-coronary: 36 mm   Right-coronary: 36 mm   Left-coronary: 40 mm   Sinus of Valsalva Height:   Non-coronary: 25.7 mm   Right-coronary: 30.4 mm   Left-coronary: 26.2 mm   Sinotubular Junction: 40 mm   Ascending Thoracic Aorta: Moderately dilated ascending aorta up to 43 mm.   Coronary Arteries: Normal coronary origin. Right dominance. The study was performed without use of NTG and is insufficient for plaque evaluation. Please refer to recent cardiac catheterization for coronary assessment.   Cardiac Morphology:   Right Atrium: Right atrial size is within normal limits.   Right Ventricle: The right ventricular cavity is within normal limits.   Left Atrium: Left atrial size is normal in size with no left atrial appendage filling defect.   Left Ventricle: The ventricular cavity size is within normal limits. Severe asymmetric hypertrophy of the basal septum up to 25 mm.   Pulmonary arteries: Dilated pulmonary artery consistent with pulmonary hypertension.   Pulmonary veins: Normal pulmonary venous drainage.   Pericardium: Normal thickness with no significant effusion or calcium present.   Mitral Valve: The mitral valve is normal structure without significant  calcification.   Extra-cardiac findings: See attached radiology report for non-cardiac structures.   IMPRESSION: 1. Annular measurements support a 29 mm S3 or 34 mm Evolut Pro.   2. No significant annular or subannular calcifications.   3. Sufficient coronary to annulus distance.   4. Optimal Fluoroscopic Angle for Delivery: RAO 0 CRA 7   5. Moderately dilated ascending aorta up to 43 mm.   6. Severe asymmetric hypertrophy of the basal septum up to 25 mm.   7. Dilated pulmonary artery consistent with pulmonary hypertension.      Recent Lab Findings: Lab Results  Component Value Date   WBC 7.9 06/11/2023   HGB 13.3 06/18/2023   HCT 39.0 06/18/2023   PLT 261 06/11/2023   GLUCOSE 90 06/11/2023   ALT 24 01/15/2016   AST 28 01/15/2016   NA 142 06/18/2023   K 3.4 (L) 06/18/2023   CL 102 06/11/2023   CREATININE 0.84 06/11/2023   BUN 15 06/11/2023   CO2 27 06/11/2023   INR 1.08 11/11/2010      Assessment / Plan:     63 yo male with NYHA class 1-2 symptoms of severe AS with normal LV function and no CAD but with moderate PHTN. His PFTs are not an issue. He has a 43mm ascending aorta. It would be best for pt to have SAVR with evaluation of his ascending aorta at time of surgery for replacement. Pt and wife understand all the risks and goals of surgery and recovery and wisht to proceed with a tissue SAVR on 4/24.    I have spent 60 min in review of the records,  viewing studies and in face to face with patient and in coordination of future care    Eugenio Hoes 07/22/2023 1:15 PM

## 2023-07-23 ENCOUNTER — Other Ambulatory Visit: Payer: Self-pay | Admitting: *Deleted

## 2023-07-23 ENCOUNTER — Encounter: Payer: Self-pay | Admitting: Thoracic Surgery (Cardiothoracic Vascular Surgery)

## 2023-07-23 ENCOUNTER — Institutional Professional Consult (permissible substitution) (INDEPENDENT_AMBULATORY_CARE_PROVIDER_SITE_OTHER): Payer: 59 | Admitting: Thoracic Surgery (Cardiothoracic Vascular Surgery)

## 2023-07-23 ENCOUNTER — Encounter: Payer: Self-pay | Admitting: *Deleted

## 2023-07-23 VITALS — BP 158/80 | HR 66 | Resp 20 | Ht 75.0 in | Wt 207.0 lb

## 2023-07-23 DIAGNOSIS — I350 Nonrheumatic aortic (valve) stenosis: Secondary | ICD-10-CM

## 2023-07-23 NOTE — Patient Instructions (Signed)
 SAVR with possible ascending aortic replacement

## 2023-08-03 DIAGNOSIS — J432 Centrilobular emphysema: Secondary | ICD-10-CM | POA: Diagnosis not present

## 2023-08-03 DIAGNOSIS — E785 Hyperlipidemia, unspecified: Secondary | ICD-10-CM | POA: Diagnosis not present

## 2023-08-03 DIAGNOSIS — Z720 Tobacco use: Secondary | ICD-10-CM | POA: Diagnosis not present

## 2023-08-03 DIAGNOSIS — I700 Atherosclerosis of aorta: Secondary | ICD-10-CM | POA: Diagnosis not present

## 2023-08-03 DIAGNOSIS — I350 Nonrheumatic aortic (valve) stenosis: Secondary | ICD-10-CM | POA: Diagnosis not present

## 2023-08-03 DIAGNOSIS — I7121 Aneurysm of the ascending aorta, without rupture: Secondary | ICD-10-CM | POA: Diagnosis not present

## 2023-08-03 DIAGNOSIS — I5032 Chronic diastolic (congestive) heart failure: Secondary | ICD-10-CM | POA: Diagnosis not present

## 2023-08-03 DIAGNOSIS — I110 Hypertensive heart disease with heart failure: Secondary | ICD-10-CM | POA: Diagnosis not present

## 2023-08-17 NOTE — Pre-Procedure Instructions (Signed)
 Surgical Instructions   Your procedure is scheduled on August 20, 2023. Report to [facility] Main Entrance "A" at 5:30 A.M., then check in with the Admitting office. Any questions or running late day of surgery: call [phone]  Questions prior to your surgery date: call [phone], Monday-Friday, 8am-4pm. If you experience any cold or flu symptoms such as cough, fever, chills, shortness of breath, etc. between now and your scheduled surgery, please notify us  at the above number.     Remember:  Do not eat or drink after midnight the night before your surgery    Take these medicines the morning of surgery with A SIP OF WATER : amLODipine  (NORVASC )  atorvastatin  (LIPITOR)  gabapentin  (NEURONTIN )  methocarbamol  (ROBAXIN )  omeprazole  (PRILOSEC)  tamsulosin  (FLOMAX )    Continue taking your Aspirin  through the day before surgery. DO NOT take any the morning of surgery.   One week prior to surgery, STOP taking any Aleve, Naproxen, Ibuprofen , Motrin , Advil , Goody's, BC's, all herbal medications, fish oil, and non-prescription vitamins.                     Do NOT Smoke (Tobacco/Vaping) for 24 hours prior to your procedure.  If you use a CPAP at night, you may bring your mask/headgear for your overnight stay.   You will be asked to remove any contacts, glasses, piercing's, hearing aid's, dentures/partials prior to surgery. Please bring cases for these items if needed.    Patients discharged the day of surgery will not be allowed to drive home, and someone needs to stay with them for 24 hours.  SURGICAL WAITING ROOM VISITATION Patients may have no more than 2 support people in the waiting area - these visitors may rotate.   Pre-op nurse will coordinate an appropriate time for 1 ADULT support person, who may not rotate, to accompany patient in pre-op.  Children under the age of 98 must have an adult with them who is not the patient and must remain in the main waiting area with an  adult.  If the patient needs to stay at the hospital during part of their recovery, the visitor guidelines for inpatient rooms apply.  Please refer to the [facility] website for the visitor guidelines for any additional information.   If you received a COVID test during your pre-op visit  it is requested that you wear a mask when out in public, stay away from anyone that may not be feeling well and notify your surgeon if you develop symptoms. If you have been in contact with anyone that has tested positive in the last 10 days please notify you surgeon.      Pre-operative CHG Bathing Instructions   You can play a key role in reducing the risk of infection after surgery. Your skin needs to be as free of germs as possible. You can reduce the number of germs on your skin by washing with CHG (chlorhexidine  gluconate) soap before surgery. CHG is an antiseptic soap that kills germs and continues to kill germs even after washing.   DO NOT use if you have an allergy to chlorhexidine /CHG or antibacterial soaps. If your skin becomes reddened or irritated, stop using the CHG and notify one of our RNs at [phone].              TAKE A SHOWER THE NIGHT BEFORE SURGERY AND THE DAY OF SURGERY    Please keep in mind the following:  DO NOT shave, including legs and underarms,  48 hours prior to surgery.   You may shave your face before/day of surgery.  Place clean sheets on your bed the night before surgery Use a clean washcloth (not used since being washed) for each shower. DO NOT sleep with pet's night before surgery.  CHG Shower Instructions:  Wash your face and private area with normal soap. If you choose to wash your hair, wash first with your normal shampoo.  After you use shampoo/soap, rinse your hair and body thoroughly to remove shampoo/soap residue.  Turn the water  OFF and apply half the bottle of CHG soap to a CLEAN washcloth.  Apply CHG soap ONLY FROM YOUR NECK DOWN TO YOUR TOES (washing  for 3-5 minutes)  DO NOT use CHG soap on face, private areas, open wounds, or sores.  Pay special attention to the area where your surgery is being performed.  If you are having back surgery, having someone wash your back for you may be helpful. Wait 2 minutes after CHG soap is applied, then you may rinse off the CHG soap.  Pat dry with a clean towel  Put on clean pajamas    Additional instructions for the day of surgery: DO NOT APPLY any lotions, deodorants, cologne, or perfumes.   Do not wear jewelry or makeup Do not wear nail polish, gel polish, artificial nails, or any other type of covering on natural nails (fingers and toes) Do not bring valuables to the hospital. [facility] is not responsible for valuables/personal belongings. Put on clean/comfortable clothes.  Please brush your teeth.  Ask your nurse before applying any prescription medications to the skin.

## 2023-08-18 ENCOUNTER — Encounter (HOSPITAL_COMMUNITY)
Admission: RE | Admit: 2023-08-18 | Discharge: 2023-08-18 | Disposition: A | Discharge status: Home / Self Care | Source: Ambulatory Visit | Attending: Thoracic Surgery (Cardiothoracic Vascular Surgery) | Admitting: Thoracic Surgery (Cardiothoracic Vascular Surgery)

## 2023-08-18 ENCOUNTER — Encounter (HOSPITAL_COMMUNITY): Payer: Self-pay

## 2023-08-18 ENCOUNTER — Other Ambulatory Visit: Payer: Self-pay

## 2023-08-18 ENCOUNTER — Ambulatory Visit (HOSPITAL_COMMUNITY)
Admission: RE | Admit: 2023-08-18 | Discharge: 2023-08-18 | Disposition: A | Discharge status: Home / Self Care | Source: Ambulatory Visit | Attending: Thoracic Surgery (Cardiothoracic Vascular Surgery) | Admitting: Thoracic Surgery (Cardiothoracic Vascular Surgery)

## 2023-08-18 ENCOUNTER — Ambulatory Visit (HOSPITAL_BASED_OUTPATIENT_CLINIC_OR_DEPARTMENT_OTHER)
Admission: RE | Admit: 2023-08-18 | Discharge: 2023-08-18 | Disposition: A | Discharge status: Home / Self Care | Source: Ambulatory Visit | Attending: Thoracic Surgery (Cardiothoracic Vascular Surgery) | Admitting: Thoracic Surgery (Cardiothoracic Vascular Surgery)

## 2023-08-18 VITALS — BP 149/82 | HR 60 | Temp 98.3°F | Resp 18 | Ht 75.0 in | Wt 203.0 lb

## 2023-08-18 DIAGNOSIS — D62 Acute posthemorrhagic anemia: Secondary | ICD-10-CM | POA: Diagnosis not present

## 2023-08-18 DIAGNOSIS — I110 Hypertensive heart disease with heart failure: Secondary | ICD-10-CM | POA: Diagnosis not present

## 2023-08-18 DIAGNOSIS — E785 Hyperlipidemia, unspecified: Secondary | ICD-10-CM | POA: Diagnosis not present

## 2023-08-18 DIAGNOSIS — Z886 Allergy status to analgesic agent status: Secondary | ICD-10-CM | POA: Diagnosis not present

## 2023-08-18 DIAGNOSIS — I472 Ventricular tachycardia, unspecified: Secondary | ICD-10-CM | POA: Diagnosis not present

## 2023-08-18 DIAGNOSIS — Z7982 Long term (current) use of aspirin: Secondary | ICD-10-CM | POA: Diagnosis not present

## 2023-08-18 DIAGNOSIS — I493 Ventricular premature depolarization: Secondary | ICD-10-CM | POA: Diagnosis not present

## 2023-08-18 DIAGNOSIS — R338 Other retention of urine: Secondary | ICD-10-CM | POA: Diagnosis not present

## 2023-08-18 DIAGNOSIS — K219 Gastro-esophageal reflux disease without esophagitis: Secondary | ICD-10-CM | POA: Diagnosis not present

## 2023-08-18 DIAGNOSIS — I350 Nonrheumatic aortic (valve) stenosis: Secondary | ICD-10-CM

## 2023-08-18 DIAGNOSIS — Z01818 Encounter for other preprocedural examination: Secondary | ICD-10-CM | POA: Insufficient documentation

## 2023-08-18 DIAGNOSIS — I272 Pulmonary hypertension, unspecified: Secondary | ICD-10-CM | POA: Diagnosis not present

## 2023-08-18 DIAGNOSIS — Z560 Unemployment, unspecified: Secondary | ICD-10-CM | POA: Diagnosis not present

## 2023-08-18 DIAGNOSIS — I4891 Unspecified atrial fibrillation: Secondary | ICD-10-CM | POA: Diagnosis not present

## 2023-08-18 DIAGNOSIS — I7121 Aneurysm of the ascending aorta, without rupture: Secondary | ICD-10-CM | POA: Diagnosis not present

## 2023-08-18 DIAGNOSIS — I5032 Chronic diastolic (congestive) heart failure: Secondary | ICD-10-CM | POA: Diagnosis not present

## 2023-08-18 DIAGNOSIS — Z8249 Family history of ischemic heart disease and other diseases of the circulatory system: Secondary | ICD-10-CM | POA: Diagnosis not present

## 2023-08-18 DIAGNOSIS — Z79899 Other long term (current) drug therapy: Secondary | ICD-10-CM | POA: Diagnosis not present

## 2023-08-18 DIAGNOSIS — K5900 Constipation, unspecified: Secondary | ICD-10-CM | POA: Diagnosis not present

## 2023-08-18 HISTORY — DX: Cardiac murmur, unspecified: R01.1

## 2023-08-18 LAB — CBC
HCT: 49 % (ref 39.0–52.0)
Hemoglobin: 15.9 g/dL (ref 13.0–17.0)
MCH: 29.2 pg (ref 26.0–34.0)
MCHC: 32.4 g/dL (ref 30.0–36.0)
MCV: 90.1 fL (ref 80.0–100.0)
Platelets: 278 10*3/uL (ref 150–400)
RBC: 5.44 MIL/uL (ref 4.22–5.81)
RDW: 13.8 % (ref 11.5–15.5)
WBC: 6.2 10*3/uL (ref 4.0–10.5)
nRBC: 0 % (ref 0.0–0.2)

## 2023-08-18 LAB — URINALYSIS, ROUTINE W REFLEX MICROSCOPIC
Bacteria, UA: NONE SEEN
Bilirubin Urine: NEGATIVE
Glucose, UA: NEGATIVE mg/dL
Ketones, ur: NEGATIVE mg/dL
Leukocytes,Ua: NEGATIVE
Nitrite: NEGATIVE
Protein, ur: 30 mg/dL — AB
RBC / HPF: >50 RBC/hpf (ref 0–5)
Specific Gravity, Urine: 1.026 (ref 1.005–1.030)
pH: 5 (ref 5.0–8.0)

## 2023-08-18 LAB — COMPREHENSIVE METABOLIC PANEL WITH GFR
ALT: 12 U/L (ref 0–44)
AST: 19 U/L (ref 15–41)
Albumin: 3.8 g/dL (ref 3.5–5.0)
Alkaline Phosphatase: 69 U/L (ref 38–126)
Anion gap: 9 (ref 5–15)
BUN: 15 mg/dL (ref 8–23)
CO2: 29 mmol/L (ref 22–32)
Calcium: 9.4 mg/dL (ref 8.9–10.3)
Chloride: 102 mmol/L (ref 98–111)
Creatinine, Ser: 0.97 mg/dL (ref 0.61–1.24)
GFR, Estimated: >60 mL/min (ref >60)
Glucose, Bld: 87 mg/dL (ref 70–99)
Potassium: 3.9 mmol/L (ref 3.5–5.1)
Sodium: 140 mmol/L (ref 135–145)
Total Bilirubin: 1 mg/dL (ref 0.0–1.2)
Total Protein: 7.7 g/dL (ref 6.5–8.1)

## 2023-08-18 LAB — PROTIME-INR
INR: 1.1 (ref 0.8–1.2)
Prothrombin Time: 14.5 s (ref 11.4–15.2)

## 2023-08-18 LAB — TYPE AND SCREEN
ABO/RH(D): O POS
Antibody Screen: NEGATIVE

## 2023-08-18 LAB — HEMOGLOBIN A1C
Hgb A1c MFr Bld: 4.7 % — ABNORMAL LOW (ref 4.8–5.6)
Mean Plasma Glucose: 88.19 mg/dL

## 2023-08-18 LAB — APTT: aPTT: 28 s (ref 24–36)

## 2023-08-18 LAB — SURGICAL PCR SCREEN
MRSA, PCR: NEGATIVE
Staphylococcus aureus: NEGATIVE

## 2023-08-18 NOTE — Progress Notes (Signed)
 PCP - Dr. Wilburn Handler Cardiologist - Dr. Vishnu Mallipeddi - Last office visit 06/11/2023  PPM/ICD - Denies Device Orders - n/a Rep Notified - n/a  Chest x-ray - 08/18/2023 EKG - 08/18/2023 Stress Test - Denies ECHO - 06/09/2023 Cardiac Cath - 06/18/2023 CT Coronary 06/30/2023  Sleep Study - Denies CPAP - n/a  No DM  Last dose of GLP1 agonist- n/a GLP1 instructions: n/a  Blood Thinner Instructions: n/a Aspirin  Instructions: Pt instructed to continue taking ASA through the day before surgery and none the morning of surgery  NPO after midnight  COVID TEST- n/a   Anesthesia review: Yes. EKG review.  Patient denies shortness of breath, fever, cough and chest pain at PAT appointment. Pt denies any respiratory illness/infection in the last two months.   All instructions explained to the patient, with a verbal understanding of the material. Patient agrees to go over the instructions while at home for a better understanding. Patient also instructed to self quarantine after being tested for COVID-19. The opportunity to ask questions was provided.

## 2023-08-19 MED ORDER — MILRINONE LACTATE IN DEXTROSE 20-5 MG/100ML-% IV SOLN
0.3000 ug/kg/min | INTRAVENOUS | Status: DC
  Filled 2023-08-19: qty 100

## 2023-08-19 MED ORDER — CEFAZOLIN SODIUM-DEXTROSE 2-4 GM/100ML-% IV SOLN
2.0000 g | INTRAVENOUS | Status: AC
  Administered 2023-08-20: 2 g via INTRAVENOUS
  Filled 2023-08-19: qty 100

## 2023-08-19 MED ORDER — MANNITOL 20 % IV SOLN
INTRAVENOUS | Status: DC
  Filled 2023-08-19: qty 13

## 2023-08-19 MED ORDER — VANCOMYCIN HCL 1000 MG IV SOLR
INTRAVENOUS | Status: DC
  Filled 2023-08-19: qty 20

## 2023-08-19 MED ORDER — TRANEXAMIC ACID 1000 MG/10ML IV SOLN
1.5000 mg/kg/h | INTRAVENOUS | Status: AC
  Administered 2023-08-20: 1.5 mg/kg/h via INTRAVENOUS
  Filled 2023-08-19: qty 25

## 2023-08-19 MED ORDER — HEPARIN 30,000 UNITS/1000 ML (OHS) CELLSAVER SOLUTION
Status: DC
  Filled 2023-08-19: qty 1000

## 2023-08-19 MED ORDER — NOREPINEPHRINE 4 MG/250ML-% IV SOLN
0.0000 ug/min | INTRAVENOUS | Status: DC
  Filled 2023-08-19: qty 250

## 2023-08-19 MED ORDER — NITROGLYCERIN IN D5W 200-5 MCG/ML-% IV SOLN
2.0000 ug/min | INTRAVENOUS | Status: DC
  Filled 2023-08-19: qty 250

## 2023-08-19 MED ORDER — PHENYLEPHRINE HCL-NACL 20-0.9 MG/250ML-% IV SOLN
30.0000 ug/min | INTRAVENOUS | Status: AC
  Administered 2023-08-20: 25 ug/min via INTRAVENOUS
  Filled 2023-08-19: qty 250

## 2023-08-19 MED ORDER — DEXMEDETOMIDINE HCL IN NACL 400 MCG/100ML IV SOLN
0.1000 ug/kg/h | INTRAVENOUS | Status: AC
  Administered 2023-08-20: .4 ug/kg/h via INTRAVENOUS
  Filled 2023-08-19: qty 100

## 2023-08-19 MED ORDER — TRANEXAMIC ACID (OHS) PUMP PRIME SOLUTION
2.0000 mg/kg | INTRAVENOUS | Status: DC
  Filled 2023-08-19: qty 1.84

## 2023-08-19 MED ORDER — CEFAZOLIN SODIUM-DEXTROSE 2-4 GM/100ML-% IV SOLN
2.0000 g | INTRAVENOUS | Status: DC
  Filled 2023-08-19: qty 100

## 2023-08-19 MED ORDER — POTASSIUM CHLORIDE 2 MEQ/ML IV SOLN
80.0000 meq | INTRAVENOUS | Status: DC
  Filled 2023-08-19: qty 40

## 2023-08-19 MED ORDER — PAPAVERINE HCL 30 MG/ML IJ SOLN
INTRAMUSCULAR | Status: DC
  Filled 2023-08-19: qty 2.5

## 2023-08-19 MED ORDER — TRANEXAMIC ACID (OHS) BOLUS VIA INFUSION
15.0000 mg/kg | INTRAVENOUS | Status: AC
  Administered 2023-08-20: 1381.5 mg via INTRAVENOUS
  Filled 2023-08-19: qty 1382

## 2023-08-19 MED ORDER — VANCOMYCIN HCL 1.5 G IV SOLR
1500.0000 mg | INTRAVENOUS | Status: AC
  Administered 2023-08-20: 1500 mg via INTRAVENOUS
  Filled 2023-08-19: qty 30

## 2023-08-19 MED ORDER — EPINEPHRINE HCL 5 MG/250ML IV SOLN IN NS
0.0000 ug/min | INTRAVENOUS | Status: AC
  Administered 2023-08-20: 4 ug/min via INTRAVENOUS
  Filled 2023-08-19: qty 250

## 2023-08-19 MED ORDER — INSULIN REGULAR(HUMAN) IN NACL 100-0.9 UT/100ML-% IV SOLN
INTRAVENOUS | Status: AC
  Administered 2023-08-20: 1.3 [IU]/h via INTRAVENOUS
  Filled 2023-08-19: qty 100

## 2023-08-19 NOTE — Discharge Instructions (Signed)
 Discharge Instructions:  1. You may shower, please wash incisions daily with soap and water and keep dry.  If you wish to cover wounds with dressing you may do so but please keep clean and change daily.  No tub baths or swimming until incisions have completely healed.  If your incisions become red or develop any drainage please call our office at [phone]  2. No Driving until cleared by Dr. Karolee Ohs office and you are no longer using narcotic pain medications  3. Monitor your weight daily.. Please use the same scale and weigh at same time... If you gain 5-10 lbs in 48 hours with associated lower extremity swelling, please contact our office at [phone]  4. Fever of 101.5 for at least 24 hours with no source, please contact our office at [phone]  5. Activity- up as tolerated, please walk at least 3 times per day.  Avoid strenuous activity, no lifting, pushing, or pulling with your arms over 8-10 lbs for a minimum of 6 weeks  6. If any questions or concerns arise, please do not hesitate to contact our office at [phone]

## 2023-08-19 NOTE — Hospital Course (Addendum)
 HPI: This is a 63 yo male with increasing dyspnea on exertion. Patient is a smoker and has had PFTs without significant pathology. He has had an echocardiogram  now with severe aortic stenosis with a mean gradient of with a tricuspid valve and normal LV function. He has also a dilated ascending aorta which on CT is 43mm. He was evaluated by cardiology and underwent a cath with moderate PHTN but no CAD. He had TAVR CTA with acceptable anatomy for a TAVR; however, wth his age and ascending aortic dilation was felt best by structural heart team to have SAVR. Patient has no chest pain or light headedness nor palpitations. Dr. Honey Lusty discussed the need for median sternotomy for aortic valve replacement and replacement of aorta. Potential risks, benefits, and complications of the surgery were discussed with the patient and he agreed to proceed with surgery. Pre operative carotid duplex US  showed no significant internal carotid artery stenosis bilaterally.  Hospital Course: Patient underwent an aortic valve replacement (size 25 mm) and replacement of ascending aorta. He was transported from the OR to [facility] ICU in stable condition. He was extubated the evening of surgery. [name] and a line were removed on post op day one. Epicardial pacing wires, chest tubes, and foley were removed on post op day 2. He was started on prophylactic Amiodarone  and low dose Lopressor . He was above pre op weight so he was diuresed. He was transitioned off the Insulin  drip. His pre op HGA1C was 4.7. Accu checks and SS PRN will be stopped after transfer to the floor. Patient had expected post op blood loss anemia and did not require a transfusion. He was stable for transfer from the ICU to 4E for further convalescence on 04/26.

## 2023-08-19 NOTE — H&P (Signed)
 [address]       [place] [address]             [phone]                                   Stephen Koch [facility] Medical Record #[id number] Date of Birth: [birth year]   [name], MD Wilburn Handler, MD   Chief Complaint:   Increasing DOE   History of Present Illness:     63 yo male with increasing DOE. Pt is a smoker and has had PFTs without significant pathology. He has had an echo with now severe AS with a mean gradient of with a tricuspid valve and normal LV function. He has also a dilated ascending aorta which on CT is 43mm. He was evaluated by cardiology and underwent a cath with moderate PHTN but no CAD. He had TAVR CTA with acceptable anatomy for a TAVR however wth his age and ascending aortic dilation was felt best by structural heart team to have SAVR. PT has no CP or light headedness nor palpitations             Past Medical History:  Diagnosis Date   Arthritis     Chronic back pain      Forklift injury   Essential hypertension, benign     GERD (gastroesophageal reflux disease)     History of medication noncompliance                 Past Surgical History:  Procedure Laterality Date   ABDOMINAL AORTOGRAM N/A 06/18/2023    Procedure: ABDOMINAL AORTOGRAM;  Surgeon: Thukkani, Arun K, MD;  Location: [facility];  Service: Cardiovascular;  Laterality: N/A;   BIOPSY   01/04/2019    Procedure: BIOPSY;  Surgeon: Alyce Jubilee, MD;  Location: [facility];  Service: Endoscopy;;  gastric   COLONOSCOPY N/A 07/06/2012    ION:GEXBMW mucosa in the terminal ileum/Moderate sized internal hemorrhoids   COLONOSCOPY WITH PROPOFOL  N/A 01/04/2019    normal TI, internal hemorrhoids Grade 3 s/p band placement X 3.    ESOPHAGOGASTRODUODENOSCOPY N/A 04/08/2013    UXL:KGMWN gastric ulcer/duodenal inflammation. bx with chronic active gastritis with focal intestinal metaplasia, ulceration and H pylori   ESOPHAGOGASTRODUODENOSCOPY (EGD)  WITH PROPOFOL  N/A 01/04/2019    moderate gastritis s/p biopsy, negative Hpylori   HEMORRHOID BANDING N/A 01/04/2019    Procedure: HEMORRHOID BANDING;  Surgeon: Alyce Jubilee, MD;  Location: [facility];  Service: Endoscopy;  Laterality: N/A;   Left knee surgery Left      arthroscopy   QUADRICEPS TENDON REPAIR Left 03/05/2017    Procedure: REPAIR QUADRICEP TENDON;  Surgeon: Darrin Emerald, MD;  Location: [facility];  Service: Orthopedics;  Laterality: Left;   Right arm surgery        tendon repair   RIGHT HEART CATH AND CORONARY ANGIOGRAPHY N/A 06/18/2023    Procedure: RIGHT HEART CATH AND CORONARY ANGIOGRAPHY;  Surgeon: Kyra Phy, MD;  Location: [facility];  Service: Cardiovascular;  Laterality: N/A;          Tobacco Use History  Social History        Tobacco Use  Smoking Status Every Day   Types: Cigars  Smokeless Tobacco Never  Tobacco Comments    smokes about 6-7 black and milds per day  Social History        Substance and Sexual Activity  Alcohol  Use Yes   Alcohol /week: 10.0 standard drinks of alcohol    Types: 10 Cans of beer per week      Social History         Socioeconomic History   Marital status: Married      Spouse name: Not on file   Number of children: 4   Years of education: Not on file   Highest education level: Not on file  Occupational History   Occupation: Unemployed; previously Naval architect work  Tobacco Use   Smoking status: Every Day      Types: Cigars   Smokeless tobacco: Never   Tobacco comments:      smokes about 6-7 black and milds per day   Vaping Use   Vaping status: Never Used  Substance and Sexual Activity   Alcohol  use: Yes      Alcohol /week: 10.0 standard drinks of alcohol       Types: 10 Cans of beer per week   Drug use: Yes      Types: Marijuana      Comment: Marijuana 2 times per week   Sexual activity: Yes      Birth control/protection: None  Other Topics Concern   Not on file  Social History Narrative     Lives w/ grandma    Social Drivers of Health    Financial Resource Strain: Not on file  Food Insecurity: Not on file  Transportation Needs: Not on file  Physical Activity: Not on file  Stress: Not on file  Social Connections: Not on file  Intimate Partner Violence: Not on file      Allergies       Allergies  Allergen Reactions   Ibuprofen  Other (See Comments)      Causes gastric bleeding              Current Outpatient Medications  Medication Sig Dispense Refill   amLODipine  (NORVASC ) 10 MG tablet Take 10 mg by mouth daily.       aspirin  EC 81 MG tablet Take 1 tablet (81 mg total) by mouth daily. Swallow whole.       atorvastatin  (LIPITOR) 40 MG tablet Take 40 mg by mouth daily.       gabapentin  (NEURONTIN ) 300 MG capsule Take 300 mg by mouth 2 (two) times daily.        methocarbamol  (ROBAXIN ) 500 MG tablet Take 500 mg by mouth 2 (two) times daily.       metoprolol  tartrate (LOPRESSOR ) 25 MG tablet Take 1 tablet (25 mg total) by mouth 2 hours prior to CT scans to slow heart rate as directed. 1 tablet 0   Multiple Vitamin (MULTIVITAMIN WITH MINERALS) TABS tablet Take 1 tablet by mouth daily.       naproxen (NAPROSYN) 500 MG tablet Take 500 mg by mouth 2 (two) times daily as needed for moderate pain (pain score 4-6).       omeprazole  (PRILOSEC) 20 MG capsule Take 1 capsule (20 mg total) by mouth daily. TAKE ONE CAPSULE BY MOUTH BEFORE BREAKFAST May take additional capsule before supper if needed 90 capsule 3   tamsulosin  (FLOMAX ) 0.4 MG CAPS capsule Take 0.4 mg by mouth daily.          No current facility-administered medications for this visit.               Family History  Problem Relation Age of Onset  Heart disease Father     Colon cancer Neg Hx     Colon polyps Neg Hx                  Physical Exam: Teeth in good repair Lungs: clear Card: RR with harsh systolic murmur Ext; no edema Neuro: intact         Diagnostic Studies & Laboratory data: I  have personally reviewed the following studies and agree with the findings   TTE (05/2023) IMPRESSIONS     1. Left ventricular ejection fraction, by estimation, is 60 to 65%. The  left ventricle has normal function. The left ventricle has no regional  wall motion abnormalities. There is severe asymmetric left ventricular  hypertrophy of the septal segment. Left   ventricular diastolic parameters are consistent with Grade I diastolic  dysfunction (impaired relaxation). Elevated left ventricular end-diastolic  pressure.   2. Right ventricular systolic function is normal. The right ventricular  size is normal. Tricuspid regurgitation signal is inadequate for assessing  PA pressure.   3. Left atrial size was moderately dilated.   4. Right atrial size was moderately dilated.   5. The mitral valve is normal in structure. Trivial mitral valve  regurgitation. No evidence of mitral stenosis.   6. The aortic valve is tricuspid. There is mild restriction in the  mobility of aortic valve leaflets. There is severe calcifcation of the  aortic valve. Aortic valve regurgitation is mild to moderate, eccentric  jet, visually but mild in severity based on   PHT. Severe aortic valve stenosis based on velcoities but mild aortic  valve stenosis based on aortic valve planimetry which is 1.8 cm2. Aortic  regurgitation PHT measures 510 msec. Aortic valve area, by VTI measures  1.04 cm. Aortic valve mean gradient  measures 45.0 mmHg. Aortic valve Vmax measures 4.52 m/s. DVI is 0.25.   7. Aortic dilatation noted. There is mild dilatation of the aortic root,  measuring 41 mm.   8. The inferior vena cava is normal in size with greater than 50%  respiratory variability, suggesting right atrial pressure of 3 mmHg.   9. Increased flow velocities may be secondary to anemia, thyrotoxicosis,  hyperdynamic or high flow state.   Comparison(s): No significant change from prior study.   FINDINGS   Left Ventricle:  Left ventricular ejection fraction, by estimation, is 60  to 65%. The left ventricle has normal function. The left ventricle has no  regional wall motion abnormalities. The left ventricular internal cavity  size was normal in size. There is   severe asymmetric left ventricular hypertrophy of the septal segment.  Left ventricular diastolic parameters are consistent with Grade I  diastolic dysfunction (impaired relaxation). Elevated left ventricular  end-diastolic pressure.   Right Ventricle: The right ventricular size is normal. No increase in  right ventricular wall thickness. Right ventricular systolic function is  normal. Tricuspid regurgitation signal is inadequate for assessing PA  pressure.   Left Atrium: Left atrial size was moderately dilated.   Right Atrium: Right atrial size was moderately dilated.   Pericardium: There is no evidence of pericardial effusion.   Mitral Valve: The mitral valve is normal in structure. Trivial mitral  valve regurgitation. No evidence of mitral valve stenosis.   Tricuspid Valve: The tricuspid valve is normal in structure. Tricuspid  valve regurgitation is not demonstrated. No evidence of tricuspid  stenosis.   Aortic Valve: The aortic valve is tricuspid. There is severe calcifcation  of the aortic valve. Aortic  valve regurgitation is mild to moderate.  Aortic regurgitation PHT measures 510 msec. Severe aortic stenosis is  present. Aortic valve mean gradient  measures 45.0 mmHg. Aortic valve peak gradient measures 81.7 mmHg. Aortic  valve area, by VTI measures 1.04 cm.   Pulmonic Valve: The pulmonic valve was normal in structure. Pulmonic valve  regurgitation is not visualized. No evidence of pulmonic stenosis.   Aorta: Aortic dilatation noted. There is mild dilatation of the aortic  root, measuring 41 mm.   Venous: The inferior vena cava is normal in size with greater than 50%  respiratory variability, suggesting right atrial pressure of  3 mmHg.   IAS/Shunts: No atrial level shunt detected by color flow Doppler.     LEFT VENTRICLE  PLAX 2D  LVIDd:         5.00 cm   Diastology  LVIDs:         3.40 cm   LV e' medial:    5.11 cm/s  LV PW:         2.00 cm   LV E/e' medial:  17.5  LV IVS:        2.20 cm   LV e' lateral:   4.03 cm/s  LVOT diam:     2.10 cm   LV E/e' lateral: 22.1  LV SV:         105  LV SV Index:   46  LVOT Area:     3.46 cm     RIGHT VENTRICLE  RV S prime:     13.30 cm/s  TAPSE (M-mode): 2.3 cm   LEFT ATRIUM              Index        RIGHT ATRIUM           Index  LA diam:        4.60 cm  2.03 cm/m   RA Area:     25.00 cm  LA Vol (A2C):   122.0 ml 53.91 ml/m  RA Volume:   89.50 ml  39.55 ml/m  LA Vol (A4C):   106.0 ml 46.84 ml/m  LA Biplane Vol: 121.0 ml 53.47 ml/m   AORTIC VALVE  AV Area (Vmax):    0.92 cm  AV Area (Vmean):   0.93 cm  AV Area (VTI):     1.04 cm  AV Vmax:           452.00 cm/s  AV Vmean:          306.200 cm/s  AV VTI:            1.014 m  AV Peak Grad:      81.7 mmHg  AV Mean Grad:      45.0 mmHg  LVOT Vmax:         120.50 cm/s  LVOT Vmean:        82.200 cm/s  LVOT VTI:          0.304 m  LVOT/AV VTI ratio: 0.30  AI PHT:            510 msec    AORTA  Ao Root diam: 4.10 cm   MITRAL VALVE  MV Area (PHT): 2.37 cm     SHUNTS  MV Decel Time: 320 msec     Systemic VTI:  0.30 m  MV E velocity: 89.20 cm/s   Systemic Diam: 2.10 cm  MV A velocity: 106.00 cm/s  MV E/A ratio:  0.84    CATH (  05/2023) Conclusion   1.  Minimal nonobstructive coronary artery disease of right dominant system.  The right coronary artery has an anterior takeoff. 2.  Fick cardiac output of 11.4 L/min and Fick cardiac index of 5.1 L/min/m with the following hemodynamics:            Right atrial pressure mean of 11 mmHg            Right ventricular pressure 46/7 with an end-diastolic pressure of 16 mmHg            Wedge pressure mean of 20 mmHg with V waves to 22 mmHg            PA pressure  45/21 with a mean of 33 mmHg            PVR of 1.1 Woods units            PA pulsatility index of 2.2 3.  Capacious iliofemoral vessels bilaterally.    Recent Radiology Findings:   CTA (06/2023) FINDINGS: Image quality: Excellent.   Noise artifact is: Limited.   Valve Morphology: Tricuspid aortic valve with severely calcified leaflets. There is restricted leaflet motion in systole. There is incomplete leaflet coaptation consistent with at least moderate aortic regurgitation.   Aortic Valve Calcium  score: 2497   Aortic annular dimension:   Phase assessed: 25%   Annular area: 577 mm2   Annular perimeter: 87.8 mm2   Max diameter: 30.0 mm   Min diameter: 26.0 mm   Annular and subannular calcification: None.   Membranous septum length: 9.9 mm   Optimal coplanar projection: RAO 0 CRA 7   Coronary Artery Height above Annulus:   Left Main: 14.8 mm   Right Coronary:   Sinus of Valsalva Measurements:   Non-coronary: 36 mm   Right-coronary: 36 mm   Left-coronary: 40 mm   Sinus of Valsalva Height:   Non-coronary: 25.7 mm   Right-coronary: 30.4 mm   Left-coronary: 26.2 mm   Sinotubular Junction: 40 mm   Ascending Thoracic Aorta: Moderately dilated ascending aorta up to 43 mm.   Coronary Arteries: Normal coronary origin. Right dominance. The study was performed without use of NTG and is insufficient for plaque evaluation. Please refer to recent cardiac catheterization for coronary assessment.   Cardiac Morphology:   Right Atrium: Right atrial size is within normal limits.   Right Ventricle: The right ventricular cavity is within normal limits.   Left Atrium: Left atrial size is normal in size with no left atrial appendage filling defect.   Left Ventricle: The ventricular cavity size is within normal limits. Severe asymmetric hypertrophy of the basal septum up to 25 mm.   Pulmonary arteries: Dilated pulmonary artery consistent with pulmonary  hypertension.   Pulmonary veins: Normal pulmonary venous drainage.   Pericardium: Normal thickness with no significant effusion or calcium  present.   Mitral Valve: The mitral valve is normal structure without significant calcification.   Extra-cardiac findings: See attached radiology report for non-cardiac structures.   IMPRESSION: 1. Annular measurements support a 29 mm S3 or 34 mm Evolut Pro.   2. No significant annular or subannular calcifications.   3. Sufficient coronary to annulus distance.   4. Optimal Fluoroscopic Angle for Delivery: RAO 0 CRA 7   5. Moderately dilated ascending aorta up to 43 mm.   6. Severe asymmetric hypertrophy of the basal septum up to 25 mm.   7. Dilated pulmonary artery consistent with pulmonary hypertension.       Recent  Lab Findings: Recent Labs       Lab Results  Component Value Date    WBC 7.9 06/11/2023    HGB 13.3 06/18/2023    HCT 39.0 06/18/2023    PLT 261 06/11/2023    GLUCOSE 90 06/11/2023    ALT 24 01/15/2016    AST 28 01/15/2016    NA 142 06/18/2023    K 3.4 (L) 06/18/2023    CL 102 06/11/2023    CREATININE 0.84 06/11/2023    BUN 15 06/11/2023    CO2 27 06/11/2023    INR 1.08 11/11/2010            Assessment / Plan:     63 yo male with NYHA class 1-2 symptoms of severe AS with normal LV function and no CAD but with moderate PHTN. His PFTs are not an issue. He has a 43mm ascending aorta. It would be best for pt to have SAVR with evaluation of his ascending aorta at time of surgery for replacement. Pt and wife understand all the risks and goals of surgery and recovery and wisht to proceed with a tissue SAVR on 4/24.

## 2023-08-20 ENCOUNTER — Inpatient Hospital Stay (HOSPITAL_COMMUNITY)
Admission: RE | Disposition: A | Discharge status: Home / Self Care | Payer: Self-pay | Source: Home / Self Care | Attending: Thoracic Surgery (Cardiothoracic Vascular Surgery)

## 2023-08-20 ENCOUNTER — Inpatient Hospital Stay (HOSPITAL_COMMUNITY)

## 2023-08-20 ENCOUNTER — Encounter (HOSPITAL_COMMUNITY): Payer: Self-pay | Admitting: Thoracic Surgery (Cardiothoracic Vascular Surgery)

## 2023-08-20 ENCOUNTER — Other Ambulatory Visit: Payer: Self-pay

## 2023-08-20 ENCOUNTER — Inpatient Hospital Stay (HOSPITAL_COMMUNITY): Admitting: Physician Assistant

## 2023-08-20 ENCOUNTER — Inpatient Hospital Stay (HOSPITAL_COMMUNITY): Admitting: Certified Registered Nurse Anesthetist

## 2023-08-20 ENCOUNTER — Inpatient Hospital Stay (HOSPITAL_COMMUNITY)
Admission: RE | Admit: 2023-08-20 | Discharge: 2023-08-25 | DRG: 220 | Disposition: A | Discharge status: Home / Self Care | Attending: Thoracic Surgery (Cardiothoracic Vascular Surgery) | Admitting: Thoracic Surgery (Cardiothoracic Vascular Surgery)

## 2023-08-20 DIAGNOSIS — K5900 Constipation, unspecified: Secondary | ICD-10-CM | POA: Diagnosis not present

## 2023-08-20 DIAGNOSIS — N401 Enlarged prostate with lower urinary tract symptoms: Secondary | ICD-10-CM | POA: Diagnosis present

## 2023-08-20 DIAGNOSIS — I493 Ventricular premature depolarization: Secondary | ICD-10-CM | POA: Diagnosis not present

## 2023-08-20 DIAGNOSIS — I5032 Chronic diastolic (congestive) heart failure: Secondary | ICD-10-CM | POA: Diagnosis not present

## 2023-08-20 DIAGNOSIS — D62 Acute posthemorrhagic anemia: Secondary | ICD-10-CM | POA: Diagnosis not present

## 2023-08-20 DIAGNOSIS — J9811 Atelectasis: Secondary | ICD-10-CM | POA: Diagnosis not present

## 2023-08-20 DIAGNOSIS — Z4682 Encounter for fitting and adjustment of non-vascular catheter: Secondary | ICD-10-CM | POA: Diagnosis not present

## 2023-08-20 DIAGNOSIS — Z79899 Other long term (current) drug therapy: Secondary | ICD-10-CM

## 2023-08-20 DIAGNOSIS — I110 Hypertensive heart disease with heart failure: Secondary | ICD-10-CM | POA: Diagnosis not present

## 2023-08-20 DIAGNOSIS — R338 Other retention of urine: Secondary | ICD-10-CM | POA: Diagnosis not present

## 2023-08-20 DIAGNOSIS — I350 Nonrheumatic aortic (valve) stenosis: Principal | ICD-10-CM

## 2023-08-20 DIAGNOSIS — J811 Chronic pulmonary edema: Secondary | ICD-10-CM | POA: Diagnosis not present

## 2023-08-20 DIAGNOSIS — I4891 Unspecified atrial fibrillation: Secondary | ICD-10-CM | POA: Diagnosis not present

## 2023-08-20 DIAGNOSIS — K219 Gastro-esophageal reflux disease without esophagitis: Secondary | ICD-10-CM | POA: Diagnosis present

## 2023-08-20 DIAGNOSIS — I517 Cardiomegaly: Secondary | ICD-10-CM | POA: Diagnosis not present

## 2023-08-20 DIAGNOSIS — Z886 Allergy status to analgesic agent status: Secondary | ICD-10-CM

## 2023-08-20 DIAGNOSIS — I472 Ventricular tachycardia, unspecified: Secondary | ICD-10-CM | POA: Diagnosis not present

## 2023-08-20 DIAGNOSIS — Z953 Presence of xenogenic heart valve: Secondary | ICD-10-CM

## 2023-08-20 DIAGNOSIS — Z560 Unemployment, unspecified: Secondary | ICD-10-CM | POA: Diagnosis not present

## 2023-08-20 DIAGNOSIS — I7121 Aneurysm of the ascending aorta, without rupture: Secondary | ICD-10-CM

## 2023-08-20 DIAGNOSIS — F17210 Nicotine dependence, cigarettes, uncomplicated: Secondary | ICD-10-CM | POA: Diagnosis not present

## 2023-08-20 DIAGNOSIS — J939 Pneumothorax, unspecified: Secondary | ICD-10-CM | POA: Diagnosis not present

## 2023-08-20 DIAGNOSIS — Z720 Tobacco use: Secondary | ICD-10-CM | POA: Diagnosis not present

## 2023-08-20 DIAGNOSIS — Z716 Tobacco abuse counseling: Secondary | ICD-10-CM | POA: Diagnosis not present

## 2023-08-20 DIAGNOSIS — Z8249 Family history of ischemic heart disease and other diseases of the circulatory system: Secondary | ICD-10-CM

## 2023-08-20 DIAGNOSIS — I10 Essential (primary) hypertension: Secondary | ICD-10-CM

## 2023-08-20 DIAGNOSIS — Z9889 Other specified postprocedural states: Secondary | ICD-10-CM | POA: Diagnosis not present

## 2023-08-20 DIAGNOSIS — Z7982 Long term (current) use of aspirin: Secondary | ICD-10-CM | POA: Diagnosis not present

## 2023-08-20 DIAGNOSIS — E785 Hyperlipidemia, unspecified: Secondary | ICD-10-CM | POA: Diagnosis present

## 2023-08-20 DIAGNOSIS — I272 Pulmonary hypertension, unspecified: Secondary | ICD-10-CM | POA: Diagnosis present

## 2023-08-20 DIAGNOSIS — R918 Other nonspecific abnormal finding of lung field: Secondary | ICD-10-CM | POA: Diagnosis not present

## 2023-08-20 DIAGNOSIS — Z452 Encounter for adjustment and management of vascular access device: Secondary | ICD-10-CM | POA: Diagnosis not present

## 2023-08-20 HISTORY — PX: TEE WITHOUT CARDIOVERSION: SHX5443

## 2023-08-20 HISTORY — PX: AORTIC VALVE REPLACEMENT: SHX41

## 2023-08-20 LAB — POCT I-STAT, CHEM 8
BUN: 14 mg/dL (ref 8–23)
BUN: 13 mg/dL (ref 8–23)
BUN: 13 mg/dL (ref 8–23)
BUN: 14 mg/dL (ref 8–23)
BUN: 12 mg/dL (ref 8–23)
Calcium, Ion: 1.14 mmol/L — ABNORMAL LOW (ref 1.15–1.40)
Calcium, Ion: 1.1 mmol/L — ABNORMAL LOW (ref 1.15–1.40)
Calcium, Ion: 1.16 mmol/L (ref 1.15–1.40)
Calcium, Ion: 1.1 mmol/L — ABNORMAL LOW (ref 1.15–1.40)
Calcium, Ion: 1.11 mmol/L — ABNORMAL LOW (ref 1.15–1.40)
Chloride: 103 mmol/L (ref 98–111)
Chloride: 104 mmol/L (ref 98–111)
Chloride: 104 mmol/L (ref 98–111)
Chloride: 104 mmol/L (ref 98–111)
Chloride: 105 mmol/L (ref 98–111)
Creatinine, Ser: 0.7 mg/dL (ref 0.61–1.24)
Creatinine, Ser: 0.6 mg/dL — ABNORMAL LOW (ref 0.61–1.24)
Creatinine, Ser: 0.7 mg/dL (ref 0.61–1.24)
Creatinine, Ser: 0.7 mg/dL (ref 0.61–1.24)
Creatinine, Ser: 0.7 mg/dL (ref 0.61–1.24)
Glucose, Bld: 104 mg/dL — ABNORMAL HIGH (ref 70–99)
Glucose, Bld: 104 mg/dL — ABNORMAL HIGH (ref 70–99)
Glucose, Bld: 121 mg/dL — ABNORMAL HIGH (ref 70–99)
Glucose, Bld: 150 mg/dL — ABNORMAL HIGH (ref 70–99)
Glucose, Bld: 164 mg/dL — ABNORMAL HIGH (ref 70–99)
HCT: 43 % (ref 39.0–52.0)
HCT: 33 % — ABNORMAL LOW (ref 39.0–52.0)
HCT: 39 % (ref 39.0–52.0)
HCT: 33 % — ABNORMAL LOW (ref 39.0–52.0)
HCT: 33 % — ABNORMAL LOW (ref 39.0–52.0)
Hemoglobin: 14.6 g/dL (ref 13.0–17.0)
Hemoglobin: 11.2 g/dL — ABNORMAL LOW (ref 13.0–17.0)
Hemoglobin: 13.3 g/dL (ref 13.0–17.0)
Hemoglobin: 11.2 g/dL — ABNORMAL LOW (ref 13.0–17.0)
Hemoglobin: 11.2 g/dL — ABNORMAL LOW (ref 13.0–17.0)
Potassium: 3.7 mmol/L (ref 3.5–5.1)
Potassium: 3.7 mmol/L (ref 3.5–5.1)
Potassium: 4.7 mmol/L (ref 3.5–5.1)
Potassium: 5.6 mmol/L — ABNORMAL HIGH (ref 3.5–5.1)
Potassium: 4.2 mmol/L (ref 3.5–5.1)
Sodium: 140 mmol/L (ref 135–145)
Sodium: 137 mmol/L (ref 135–145)
Sodium: 139 mmol/L (ref 135–145)
Sodium: 137 mmol/L (ref 135–145)
Sodium: 138 mmol/L (ref 135–145)
TCO2: 26 mmol/L (ref 22–32)
TCO2: 26 mmol/L (ref 22–32)
TCO2: 27 mmol/L (ref 22–32)
TCO2: 25 mmol/L (ref 22–32)
TCO2: 25 mmol/L (ref 22–32)

## 2023-08-20 LAB — POCT I-STAT 7, (LYTES, BLD GAS, ICA,H+H)
Acid-Base Excess: 1 mmol/L (ref 0.0–2.0)
Acid-base deficit: 1 mmol/L (ref 0.0–2.0)
Acid-base deficit: 2 mmol/L (ref 0.0–2.0)
Acid-base deficit: 4 mmol/L — ABNORMAL HIGH (ref 0.0–2.0)
Acid-base deficit: 3 mmol/L — ABNORMAL HIGH (ref 0.0–2.0)
Acid-base deficit: 2 mmol/L (ref 0.0–2.0)
Bicarbonate: 26.9 mmol/L (ref 20.0–28.0)
Bicarbonate: 24.4 mmol/L (ref 20.0–28.0)
Bicarbonate: 23.9 mmol/L (ref 20.0–28.0)
Bicarbonate: 21.6 mmol/L (ref 20.0–28.0)
Bicarbonate: 22.4 mmol/L (ref 20.0–28.0)
Bicarbonate: 23.7 mmol/L (ref 20.0–28.0)
Calcium, Ion: 1.21 mmol/L (ref 1.15–1.40)
Calcium, Ion: 1.02 mmol/L — ABNORMAL LOW (ref 1.15–1.40)
Calcium, Ion: 1.1 mmol/L — ABNORMAL LOW (ref 1.15–1.40)
Calcium, Ion: 1.11 mmol/L — ABNORMAL LOW (ref 1.15–1.40)
Calcium, Ion: 1.16 mmol/L (ref 1.15–1.40)
Calcium, Ion: 1.16 mmol/L (ref 1.15–1.40)
HCT: 43 % (ref 39.0–52.0)
HCT: 32 % — ABNORMAL LOW (ref 39.0–52.0)
HCT: 33 % — ABNORMAL LOW (ref 39.0–52.0)
HCT: 36 % — ABNORMAL LOW (ref 39.0–52.0)
HCT: 34 % — ABNORMAL LOW (ref 39.0–52.0)
HCT: 34 % — ABNORMAL LOW (ref 39.0–52.0)
Hemoglobin: 14.6 g/dL (ref 13.0–17.0)
Hemoglobin: 10.9 g/dL — ABNORMAL LOW (ref 13.0–17.0)
Hemoglobin: 11.2 g/dL — ABNORMAL LOW (ref 13.0–17.0)
Hemoglobin: 12.2 g/dL — ABNORMAL LOW (ref 13.0–17.0)
Hemoglobin: 11.6 g/dL — ABNORMAL LOW (ref 13.0–17.0)
Hemoglobin: 11.6 g/dL — ABNORMAL LOW (ref 13.0–17.0)
O2 Saturation: 100 %
O2 Saturation: 100 %
O2 Saturation: 100 %
O2 Saturation: 96 %
O2 Saturation: 98 %
O2 Saturation: 99 %
Patient temperature: 35.6
Patient temperature: 37.2
Patient temperature: 37.1
Potassium: 3.7 mmol/L (ref 3.5–5.1)
Potassium: 3.5 mmol/L (ref 3.5–5.1)
Potassium: 4.2 mmol/L (ref 3.5–5.1)
Potassium: 4.1 mmol/L (ref 3.5–5.1)
Potassium: 4.6 mmol/L (ref 3.5–5.1)
Potassium: 4.5 mmol/L (ref 3.5–5.1)
Sodium: 140 mmol/L (ref 135–145)
Sodium: 140 mmol/L (ref 135–145)
Sodium: 139 mmol/L (ref 135–145)
Sodium: 138 mmol/L (ref 135–145)
Sodium: 139 mmol/L (ref 135–145)
Sodium: 140 mmol/L (ref 135–145)
TCO2: 28 mmol/L (ref 22–32)
TCO2: 26 mmol/L (ref 22–32)
TCO2: 25 mmol/L (ref 22–32)
TCO2: 23 mmol/L (ref 22–32)
TCO2: 24 mmol/L (ref 22–32)
TCO2: 25 mmol/L (ref 22–32)
pCO2 arterial: 46.1 mmHg (ref 32–48)
pCO2 arterial: 44.9 mmHg (ref 32–48)
pCO2 arterial: 45.8 mmHg (ref 32–48)
pCO2 arterial: 39.9 mmHg (ref 32–48)
pCO2 arterial: 40.3 mmHg (ref 32–48)
pCO2 arterial: 42.8 mmHg (ref 32–48)
pH, Arterial: 7.374 (ref 7.35–7.45)
pH, Arterial: 7.343 — ABNORMAL LOW (ref 7.35–7.45)
pH, Arterial: 7.326 — ABNORMAL LOW (ref 7.35–7.45)
pH, Arterial: 7.336 — ABNORMAL LOW (ref 7.35–7.45)
pH, Arterial: 7.354 (ref 7.35–7.45)
pH, Arterial: 7.352 (ref 7.35–7.45)
pO2, Arterial: 293 mmHg — ABNORMAL HIGH (ref 83–108)
pO2, Arterial: 418 mmHg — ABNORMAL HIGH (ref 83–108)
pO2, Arterial: 284 mmHg — ABNORMAL HIGH (ref 83–108)
pO2, Arterial: 85 mmHg (ref 83–108)
pO2, Arterial: 116 mmHg — ABNORMAL HIGH (ref 83–108)
pO2, Arterial: 124 mmHg — ABNORMAL HIGH (ref 83–108)

## 2023-08-20 LAB — CBC
HCT: 37 % — ABNORMAL LOW (ref 39.0–52.0)
HCT: 34.2 % — ABNORMAL LOW (ref 39.0–52.0)
Hemoglobin: 12.2 g/dL — ABNORMAL LOW (ref 13.0–17.0)
Hemoglobin: 11.2 g/dL — ABNORMAL LOW (ref 13.0–17.0)
MCH: 29.7 pg (ref 26.0–34.0)
MCH: 29.2 pg (ref 26.0–34.0)
MCHC: 33 g/dL (ref 30.0–36.0)
MCHC: 32.7 g/dL (ref 30.0–36.0)
MCV: 90 fL (ref 80.0–100.0)
MCV: 89.1 fL (ref 80.0–100.0)
Platelets: 150 10*3/uL (ref 150–400)
Platelets: 155 10*3/uL (ref 150–400)
RBC: 4.11 MIL/uL — ABNORMAL LOW (ref 4.22–5.81)
RBC: 3.84 MIL/uL — ABNORMAL LOW (ref 4.22–5.81)
RDW: 13.6 % (ref 11.5–15.5)
RDW: 13.6 % (ref 11.5–15.5)
WBC: 15 10*3/uL — ABNORMAL HIGH (ref 4.0–10.5)
WBC: 12.6 10*3/uL — ABNORMAL HIGH (ref 4.0–10.5)
nRBC: 0 % (ref 0.0–0.2)
nRBC: 0 % (ref 0.0–0.2)

## 2023-08-20 LAB — POCT I-STAT EG7
Acid-Base Excess: 0 mmol/L (ref 0.0–2.0)
Bicarbonate: 27 mmol/L (ref 20.0–28.0)
Calcium, Ion: 1.11 mmol/L — ABNORMAL LOW (ref 1.15–1.40)
HCT: 33 % — ABNORMAL LOW (ref 39.0–52.0)
Hemoglobin: 11.2 g/dL — ABNORMAL LOW (ref 13.0–17.0)
O2 Saturation: 89 %
Potassium: 4.8 mmol/L (ref 3.5–5.1)
Sodium: 139 mmol/L (ref 135–145)
TCO2: 29 mmol/L (ref 22–32)
pCO2, Ven: 52.2 mmHg (ref 44–60)
pH, Ven: 7.322 (ref 7.25–7.43)
pO2, Ven: 61 mmHg — ABNORMAL HIGH (ref 32–45)

## 2023-08-20 LAB — MAGNESIUM: Magnesium: 3.1 mg/dL — ABNORMAL HIGH (ref 1.7–2.4)

## 2023-08-20 LAB — GLUCOSE, CAPILLARY
Glucose-Capillary: 131 mg/dL — ABNORMAL HIGH (ref 70–99)
Glucose-Capillary: 147 mg/dL — ABNORMAL HIGH (ref 70–99)
Glucose-Capillary: 134 mg/dL — ABNORMAL HIGH (ref 70–99)
Glucose-Capillary: 129 mg/dL — ABNORMAL HIGH (ref 70–99)
Glucose-Capillary: 115 mg/dL — ABNORMAL HIGH (ref 70–99)
Glucose-Capillary: 117 mg/dL — ABNORMAL HIGH (ref 70–99)
Glucose-Capillary: 110 mg/dL — ABNORMAL HIGH (ref 70–99)
Glucose-Capillary: 117 mg/dL — ABNORMAL HIGH (ref 70–99)
Glucose-Capillary: 116 mg/dL — ABNORMAL HIGH (ref 70–99)

## 2023-08-20 LAB — PROTIME-INR
INR: 1.6 — ABNORMAL HIGH (ref 0.8–1.2)
Prothrombin Time: 19.5 s — ABNORMAL HIGH (ref 11.4–15.2)

## 2023-08-20 LAB — HEMOGLOBIN AND HEMATOCRIT, BLOOD
HCT: 32.5 % — ABNORMAL LOW (ref 39.0–52.0)
Hemoglobin: 10.8 g/dL — ABNORMAL LOW (ref 13.0–17.0)

## 2023-08-20 LAB — BASIC METABOLIC PANEL WITH GFR
Anion gap: 6 (ref 5–15)
BUN: 9 mg/dL (ref 8–23)
CO2: 23 mmol/L (ref 22–32)
Calcium: 7.6 mg/dL — ABNORMAL LOW (ref 8.9–10.3)
Chloride: 108 mmol/L (ref 98–111)
Creatinine, Ser: 0.75 mg/dL (ref 0.61–1.24)
GFR, Estimated: >60 mL/min (ref >60)
Glucose, Bld: 115 mg/dL — ABNORMAL HIGH (ref 70–99)
Potassium: 4.5 mmol/L (ref 3.5–5.1)
Sodium: 137 mmol/L (ref 135–145)

## 2023-08-20 LAB — PLATELET COUNT: Platelets: 184 10*3/uL (ref 150–400)

## 2023-08-20 LAB — APTT: aPTT: 32 s (ref 24–36)

## 2023-08-20 LAB — ABO/RH: ABO/RH(D): O POS

## 2023-08-20 SURGERY — REPLACEMENT, AORTIC VALVE, OPEN
Anesthesia: General | Site: Chest

## 2023-08-20 MED ORDER — ~~LOC~~ CARDIAC SURGERY, PATIENT & FAMILY EDUCATION
Freq: Once | Status: DC
  Filled 2023-08-20: qty 1

## 2023-08-20 MED ORDER — ALBUMIN HUMAN 5 % IV SOLN
250.0000 mL | INTRAVENOUS | Status: AC | PRN
  Administered 2023-08-20 (×5): 12.5 g via INTRAVENOUS
  Filled 2023-08-20 (×3): qty 250

## 2023-08-20 MED ORDER — PROPOFOL 10 MG/ML IV BOLUS
INTRAVENOUS | Status: AC
  Filled 2023-08-20: qty 20

## 2023-08-20 MED ORDER — ASPIRIN 81 MG PO CHEW
324.0000 mg | CHEWABLE_TABLET | Freq: Once | ORAL | Status: DC
  Filled 2023-08-20: qty 4

## 2023-08-20 MED ORDER — PANTOPRAZOLE SODIUM 40 MG IV SOLR
40.0000 mg | Freq: Every day | INTRAVENOUS | Status: AC
  Administered 2023-08-20 – 2023-08-21 (×2): 40 mg via INTRAVENOUS
  Filled 2023-08-20 (×2): qty 10

## 2023-08-20 MED ORDER — NITROGLYCERIN 0.2 MG/ML ON CALL CATH LAB
INTRAVENOUS | Status: DC | PRN
Start: 2023-08-20 — End: 2023-08-20
  Administered 2023-08-20: 20 ug via INTRAVENOUS
  Administered 2023-08-20: 30 ug via INTRAVENOUS

## 2023-08-20 MED ORDER — ACETAMINOPHEN 160 MG/5ML PO SOLN
650.0000 mg | Freq: Once | ORAL | Status: AC
  Administered 2023-08-20: 650 mg
  Filled 2023-08-20: qty 20.3

## 2023-08-20 MED ORDER — SODIUM CHLORIDE 0.9 % IV SOLN: INTRAVENOUS | Status: DC | PRN

## 2023-08-20 MED ORDER — MIDAZOLAM HCL 2 MG/2ML IJ SOLN
2.0000 mg | INTRAMUSCULAR | Status: DC | PRN
  Administered 2023-08-20 (×2): 2 mg via INTRAVENOUS
  Filled 2023-08-20 (×2): qty 2

## 2023-08-20 MED ORDER — METOPROLOL TARTRATE 12.5 MG HALF TABLET
12.5000 mg | ORAL_TABLET | Freq: Once | ORAL | Status: DC
  Filled 2023-08-20: qty 1

## 2023-08-20 MED ORDER — EPHEDRINE 5 MG/ML INJ
INTRAVENOUS | Status: AC
  Filled 2023-08-20: qty 5

## 2023-08-20 MED ORDER — MIDAZOLAM HCL 2 MG/2ML IJ SOLN
INTRAMUSCULAR | Status: AC
  Filled 2023-08-20: qty 2

## 2023-08-20 MED ORDER — AMIODARONE HCL 200 MG PO TABS
400.0000 mg | ORAL_TABLET | Freq: Two times a day (BID) | ORAL | Status: DC
  Administered 2023-08-21 – 2023-08-25 (×9): 400 mg via ORAL
  Filled 2023-08-20 (×9): qty 2

## 2023-08-20 MED ORDER — ATORVASTATIN CALCIUM 40 MG PO TABS
40.0000 mg | ORAL_TABLET | Freq: Every day | ORAL | Status: DC
  Administered 2023-08-21 – 2023-08-24 (×4): 40 mg via ORAL
  Filled 2023-08-20 (×6): qty 1

## 2023-08-20 MED ORDER — LIDOCAINE 2% (20 MG/ML) 5 ML SYRINGE
INTRAMUSCULAR | Status: AC
  Filled 2023-08-20: qty 5

## 2023-08-20 MED ORDER — METOPROLOL TARTRATE 25 MG/10 ML ORAL SUSPENSION
12.5000 mg | Freq: Two times a day (BID) | ORAL | Status: DC
  Filled 2023-08-20 (×4): qty 5

## 2023-08-20 MED ORDER — EPINEPHRINE 1 MG/10ML IJ SOSY
PREFILLED_SYRINGE | INTRAMUSCULAR | Status: DC | PRN
  Administered 2023-08-20: 2 ug via INTRAVENOUS
  Administered 2023-08-20: 1 ug via INTRAVENOUS
  Administered 2023-08-20: 2 ug via INTRAVENOUS
  Administered 2023-08-20: 1 ug via INTRAVENOUS

## 2023-08-20 MED ORDER — HEPARIN SODIUM (PORCINE) 1000 UNIT/ML IJ SOLN
INTRAMUSCULAR | Status: DC | PRN
  Administered 2023-08-20: 4000 [IU] via INTRAVENOUS
  Administered 2023-08-20: 30000 [IU] via INTRAVENOUS

## 2023-08-20 MED ORDER — METOCLOPRAMIDE HCL 5 MG/ML IJ SOLN
10.0000 mg | Freq: Four times a day (QID) | INTRAMUSCULAR | Status: AC
  Administered 2023-08-20 – 2023-08-21 (×6): 10 mg via INTRAVENOUS
  Filled 2023-08-20 (×6): qty 2

## 2023-08-20 MED ORDER — SODIUM CHLORIDE 0.9% FLUSH: 3.0000 mL | INTRAVENOUS | Status: DC | PRN

## 2023-08-20 MED ORDER — LACTATED RINGERS IV SOLN: INTRAVENOUS | Status: AC

## 2023-08-20 MED ORDER — FENTANYL CITRATE (PF) 250 MCG/5ML IJ SOLN
INTRAMUSCULAR | Status: AC
  Filled 2023-08-20: qty 5

## 2023-08-20 MED ORDER — LACTATED RINGERS IV SOLN: INTRAVENOUS | Status: DC | PRN

## 2023-08-20 MED ORDER — ASPIRIN 81 MG PO CHEW
324.0000 mg | CHEWABLE_TABLET | Freq: Every day | ORAL | Status: DC
  Filled 2023-08-20: qty 4

## 2023-08-20 MED ORDER — ROCURONIUM BROMIDE 10 MG/ML (PF) SYRINGE
PREFILLED_SYRINGE | INTRAVENOUS | Status: DC | PRN
Start: 2023-08-20 — End: 2023-08-20
  Administered 2023-08-20: 100 mg via INTRAVENOUS
  Administered 2023-08-20: 80 mg via INTRAVENOUS
  Administered 2023-08-20: 20 mg via INTRAVENOUS

## 2023-08-20 MED ORDER — CEFAZOLIN SODIUM-DEXTROSE 2-4 GM/100ML-% IV SOLN
2.0000 g | Freq: Three times a day (TID) | INTRAVENOUS | Status: AC
  Administered 2023-08-20 – 2023-08-22 (×6): 2 g via INTRAVENOUS
  Filled 2023-08-20 (×6): qty 100

## 2023-08-20 MED ORDER — DEXTROSE 50 % IV SOLN: 0.0000 mL | INTRAVENOUS | Status: DC | PRN

## 2023-08-20 MED ORDER — MIDAZOLAM HCL (PF) 5 MG/ML IJ SOLN
INTRAMUSCULAR | Status: DC | PRN
  Administered 2023-08-20: 2 mg via INTRAVENOUS
  Administered 2023-08-20 (×2): 1 mg via INTRAVENOUS

## 2023-08-20 MED ORDER — DOCUSATE SODIUM 100 MG PO CAPS
200.0000 mg | ORAL_CAPSULE | Freq: Every day | ORAL | Status: DC
  Administered 2023-08-21 – 2023-08-25 (×4): 200 mg via ORAL
  Filled 2023-08-20 (×5): qty 2

## 2023-08-20 MED ORDER — ONDANSETRON HCL 4 MG/2ML IJ SOLN
4.0000 mg | Freq: Four times a day (QID) | INTRAMUSCULAR | Status: DC | PRN
  Administered 2023-08-21: 4 mg via INTRAVENOUS
  Filled 2023-08-20: qty 2

## 2023-08-20 MED ORDER — SODIUM CHLORIDE 0.9 % IV SOLN: 250.0000 mL | INTRAVENOUS | Status: AC

## 2023-08-20 MED ORDER — MORPHINE SULFATE (PF) 2 MG/ML IV SOLN
1.0000 mg | INTRAVENOUS | Status: DC | PRN
Start: 2023-08-20 — End: 2023-08-25
  Administered 2023-08-20 (×3): 4 mg via INTRAVENOUS
  Administered 2023-08-21 – 2023-08-23 (×2): 2 mg via INTRAVENOUS
  Filled 2023-08-20: qty 1
  Filled 2023-08-20: qty 2
  Filled 2023-08-20: qty 1
  Filled 2023-08-20: qty 2
  Filled 2023-08-20: qty 1
  Filled 2023-08-20: qty 2

## 2023-08-20 MED ORDER — ONDANSETRON HCL 4 MG/2ML IJ SOLN
INTRAMUSCULAR | Status: DC | PRN
  Administered 2023-08-20: 8 mg via INTRAVENOUS

## 2023-08-20 MED ORDER — ACETAMINOPHEN 160 MG/5ML PO SOLN: 1000.0000 mg | Freq: Four times a day (QID) | ORAL | Status: DC

## 2023-08-20 MED ORDER — INSULIN REGULAR(HUMAN) IN NACL 100-0.9 UT/100ML-% IV SOLN: INTRAVENOUS | Status: DC

## 2023-08-20 MED ORDER — PHENYLEPHRINE 80 MCG/ML (10ML) SYRINGE FOR IV PUSH (FOR BLOOD PRESSURE SUPPORT)
PREFILLED_SYRINGE | INTRAVENOUS | Status: DC | PRN
  Administered 2023-08-20 (×2): 40 ug via INTRAVENOUS

## 2023-08-20 MED ORDER — SODIUM CHLORIDE 0.9% FLUSH
3.0000 mL | Freq: Two times a day (BID) | INTRAVENOUS | Status: DC
  Administered 2023-08-20: 10 mL via INTRAVENOUS

## 2023-08-20 MED ORDER — MIDAZOLAM HCL 2 MG/2ML IJ SOLN: 4.0000 mg | Freq: Once | INTRAMUSCULAR | Status: AC

## 2023-08-20 MED ORDER — TRAMADOL HCL 50 MG PO TABS
50.0000 mg | ORAL_TABLET | ORAL | Status: DC | PRN
  Administered 2023-08-20 – 2023-08-22 (×5): 50 mg via ORAL
  Filled 2023-08-20 (×6): qty 1

## 2023-08-20 MED ORDER — FENTANYL CITRATE (PF) 250 MCG/5ML IJ SOLN
INTRAMUSCULAR | Status: DC | PRN
  Administered 2023-08-20: 50 ug via INTRAVENOUS
  Administered 2023-08-20: 100 ug via INTRAVENOUS
  Administered 2023-08-20 (×2): 150 ug via INTRAVENOUS
  Administered 2023-08-20: 50 ug via INTRAVENOUS

## 2023-08-20 MED ORDER — NITROGLYCERIN IN D5W 200-5 MCG/ML-% IV SOLN
0.0000 ug/min | INTRAVENOUS | Status: DC
  Administered 2023-08-20: 5 ug/min via INTRAVENOUS
  Filled 2023-08-20: qty 250

## 2023-08-20 MED ORDER — CHLORHEXIDINE GLUCONATE 4 % EX SOLN: 30.0000 mL | CUTANEOUS | Status: DC

## 2023-08-20 MED ORDER — LACTATED RINGERS IV SOLN: INTRAVENOUS | Status: DC

## 2023-08-20 MED ORDER — CHLORHEXIDINE GLUCONATE 0.12 % MT SOLN
15.0000 mL | OROMUCOSAL | Status: AC
  Administered 2023-08-20: 15 mL via OROMUCOSAL
  Filled 2023-08-20: qty 15

## 2023-08-20 MED ORDER — LIDOCAINE HCL (CARDIAC) PF 100 MG/5ML IV SOSY
PREFILLED_SYRINGE | INTRAVENOUS | Status: DC | PRN
  Administered 2023-08-20: 60 mg via INTRATRACHEAL

## 2023-08-20 MED ORDER — GABAPENTIN 300 MG PO CAPS
300.0000 mg | ORAL_CAPSULE | Freq: Two times a day (BID) | ORAL | Status: DC
Start: 2023-08-21 — End: 2023-08-25
  Administered 2023-08-21 – 2023-08-25 (×9): 300 mg via ORAL
  Filled 2023-08-20 (×9): qty 1

## 2023-08-20 MED ORDER — PHENYLEPHRINE HCL-NACL 20-0.9 MG/250ML-% IV SOLN: 0.0000 ug/min | INTRAVENOUS | Status: DC

## 2023-08-20 MED ORDER — ASPIRIN 325 MG PO TBEC
325.0000 mg | DELAYED_RELEASE_TABLET | Freq: Every day | ORAL | Status: DC
  Administered 2023-08-21 – 2023-08-25 (×5): 325 mg via ORAL
  Filled 2023-08-20 (×5): qty 1

## 2023-08-20 MED ORDER — BISACODYL 10 MG RE SUPP
10.0000 mg | Freq: Every day | RECTAL | Status: DC
  Filled 2023-08-20: qty 1

## 2023-08-20 MED ORDER — PANTOPRAZOLE SODIUM 40 MG PO TBEC
40.0000 mg | DELAYED_RELEASE_TABLET | Freq: Every day | ORAL | Status: DC
  Administered 2023-08-22 – 2023-08-25 (×4): 40 mg via ORAL
  Filled 2023-08-20 (×4): qty 1

## 2023-08-20 MED ORDER — DEXMEDETOMIDINE HCL IN NACL 400 MCG/100ML IV SOLN
0.0000 ug/kg/h | INTRAVENOUS | Status: DC
Start: 2023-08-20 — End: 2023-08-21
  Administered 2023-08-20: 0.7 ug/kg/h via INTRAVENOUS
  Filled 2023-08-20: qty 100

## 2023-08-20 MED ORDER — SUGAMMADEX SODIUM 200 MG/2ML IV SOLN
INTRAVENOUS | Status: DC | PRN
  Administered 2023-08-20: 186 mg via INTRAVENOUS

## 2023-08-20 MED ORDER — ORAL CARE MOUTH RINSE: 15.0000 mL | OROMUCOSAL | Status: DC | PRN

## 2023-08-20 MED ORDER — METOPROLOL TARTRATE 12.5 MG HALF TABLET
12.5000 mg | ORAL_TABLET | Freq: Two times a day (BID) | ORAL | Status: DC
  Administered 2023-08-21 – 2023-08-23 (×6): 12.5 mg via ORAL
  Filled 2023-08-20 (×6): qty 1

## 2023-08-20 MED ORDER — ADULT MULTIVITAMIN W/MINERALS CH
1.0000 | ORAL_TABLET | Freq: Every day | ORAL | Status: DC
  Administered 2023-08-21 – 2023-08-25 (×5): 1 via ORAL
  Filled 2023-08-20 (×5): qty 1

## 2023-08-20 MED ORDER — OXYCODONE HCL 5 MG PO TABS
5.0000 mg | ORAL_TABLET | ORAL | Status: DC | PRN
Start: 2023-08-20 — End: 2023-08-25
  Administered 2023-08-20: 5 mg via ORAL
  Administered 2023-08-21 – 2023-08-23 (×10): 10 mg via ORAL
  Administered 2023-08-23: 5 mg via ORAL
  Administered 2023-08-23 – 2023-08-25 (×4): 10 mg via ORAL
  Filled 2023-08-20 (×6): qty 2
  Filled 2023-08-20: qty 1
  Filled 2023-08-20 (×9): qty 2

## 2023-08-20 MED ORDER — PHENYLEPHRINE 80 MCG/ML (10ML) SYRINGE FOR IV PUSH (FOR BLOOD PRESSURE SUPPORT)
PREFILLED_SYRINGE | INTRAVENOUS | Status: AC
  Filled 2023-08-20: qty 10

## 2023-08-20 MED ORDER — SODIUM CHLORIDE 0.9% FLUSH
3.0000 mL | Freq: Two times a day (BID) | INTRAVENOUS | Status: DC
  Administered 2023-08-21 – 2023-08-22 (×3): 3 mL via INTRAVENOUS

## 2023-08-20 MED ORDER — POTASSIUM CHLORIDE 10 MEQ/50ML IV SOLN
10.0000 meq | INTRAVENOUS | Status: AC
  Filled 2023-08-20: qty 50

## 2023-08-20 MED ORDER — METOPROLOL TARTRATE 5 MG/5ML IV SOLN: 2.5000 mg | INTRAVENOUS | Status: DC | PRN

## 2023-08-20 MED ORDER — ORAL CARE MOUTH RINSE
15.0000 mL | OROMUCOSAL | Status: DC
  Administered 2023-08-20 (×2): 15 mL via OROMUCOSAL

## 2023-08-20 MED ORDER — ONDANSETRON HCL 4 MG/2ML IJ SOLN
INTRAMUSCULAR | Status: AC
Start: 2023-08-20 — End: ?
  Filled 2023-08-20: qty 4

## 2023-08-20 MED ORDER — PROTAMINE SULFATE 10 MG/ML IV SOLN
INTRAVENOUS | Status: DC | PRN
  Administered 2023-08-20: 340 mg via INTRAVENOUS

## 2023-08-20 MED ORDER — CHLORHEXIDINE GLUCONATE 0.12 % MT SOLN
15.0000 mL | Freq: Once | OROMUCOSAL | Status: AC
  Administered 2023-08-20: 15 mL via OROMUCOSAL
  Filled 2023-08-20: qty 15

## 2023-08-20 MED ORDER — VANCOMYCIN HCL IN DEXTROSE 1-5 GM/200ML-% IV SOLN
1000.0000 mg | Freq: Once | INTRAVENOUS | Status: AC
  Administered 2023-08-20: 1000 mg via INTRAVENOUS
  Filled 2023-08-20 (×2): qty 200

## 2023-08-20 MED ORDER — ASPIRIN 325 MG PO TBEC
325.0000 mg | DELAYED_RELEASE_TABLET | Freq: Once | ORAL | Status: AC
  Administered 2023-08-20: 325 mg via ORAL
  Filled 2023-08-20: qty 1

## 2023-08-20 MED ORDER — EPINEPHRINE HCL 5 MG/250ML IV SOLN IN NS: 0.0000 ug/min | INTRAVENOUS | Status: DC

## 2023-08-20 MED ORDER — 0.9 % SODIUM CHLORIDE (POUR BTL) OPTIME
TOPICAL | Status: DC | PRN
  Administered 2023-08-20: 5000 mL

## 2023-08-20 MED ORDER — ROCURONIUM BROMIDE 10 MG/ML (PF) SYRINGE
PREFILLED_SYRINGE | INTRAVENOUS | Status: AC
  Filled 2023-08-20: qty 20

## 2023-08-20 MED ORDER — METHOCARBAMOL 500 MG PO TABS
500.0000 mg | ORAL_TABLET | Freq: Two times a day (BID) | ORAL | Status: DC
  Administered 2023-08-21 – 2023-08-25 (×9): 500 mg via ORAL
  Filled 2023-08-20 (×9): qty 1

## 2023-08-20 MED ORDER — CHLORHEXIDINE GLUCONATE CLOTH 2 % EX PADS
6.0000 | MEDICATED_PAD | Freq: Every day | CUTANEOUS | Status: DC
  Administered 2023-08-20 – 2023-08-21 (×2): 6 via TOPICAL

## 2023-08-20 MED ORDER — VANCOMYCIN HCL 1000 MG IV SOLR
INTRAVENOUS | Status: DC | PRN
  Administered 2023-08-20: 1000 mL

## 2023-08-20 MED ORDER — BISACODYL 5 MG PO TBEC
10.0000 mg | DELAYED_RELEASE_TABLET | Freq: Every day | ORAL | Status: DC
  Administered 2023-08-21 – 2023-08-25 (×4): 10 mg via ORAL
  Filled 2023-08-20 (×5): qty 2

## 2023-08-20 MED ORDER — EPHEDRINE SULFATE-NACL 50-0.9 MG/10ML-% IV SOSY
PREFILLED_SYRINGE | INTRAVENOUS | Status: DC | PRN
  Administered 2023-08-20 (×2): 5 mg via INTRAVENOUS

## 2023-08-20 MED ORDER — PROPOFOL 10 MG/ML IV BOLUS
INTRAVENOUS | Status: DC | PRN
  Administered 2023-08-20: 50 mg via INTRAVENOUS
  Administered 2023-08-20: 100 mg via INTRAVENOUS
  Administered 2023-08-20: 90 mg via INTRAVENOUS
  Administered 2023-08-20: 50 mg via INTRAVENOUS

## 2023-08-20 MED ORDER — ACETAMINOPHEN 500 MG PO TABS
1000.0000 mg | ORAL_TABLET | Freq: Four times a day (QID) | ORAL | Status: DC
  Administered 2023-08-20 – 2023-08-25 (×18): 1000 mg via ORAL
  Filled 2023-08-20 (×19): qty 2

## 2023-08-20 MED ORDER — SODIUM CHLORIDE 0.9% IV SOLUTION: Freq: Once | INTRAVENOUS | Status: AC

## 2023-08-20 MED ORDER — TAMSULOSIN HCL 0.4 MG PO CAPS
0.4000 mg | ORAL_CAPSULE | Freq: Every day | ORAL | Status: DC
  Administered 2023-08-21 – 2023-08-25 (×5): 0.4 mg via ORAL
  Filled 2023-08-20 (×5): qty 1

## 2023-08-20 MED ORDER — PLASMA-LYTE A IV SOLN
INTRAVENOUS | Status: DC | PRN
  Administered 2023-08-20: 500 mL

## 2023-08-20 MED ORDER — SODIUM CHLORIDE 0.9% FLUSH
3.0000 mL | Freq: Two times a day (BID) | INTRAVENOUS | Status: DC
Start: 2023-08-20 — End: 2023-08-25
  Administered 2023-08-20 – 2023-08-22 (×4): 10 mL via INTRAVENOUS
  Administered 2023-08-22: 3 mL via INTRAVENOUS

## 2023-08-20 MED ORDER — MAGNESIUM SULFATE 4 GM/100ML IV SOLN
4.0000 g | Freq: Once | INTRAVENOUS | Status: AC
  Administered 2023-08-20: 4 g via INTRAVENOUS
  Filled 2023-08-20: qty 100

## 2023-08-20 MED ORDER — METOPROLOL TARTRATE 5 MG/5ML IV SOLN
INTRAVENOUS | Status: DC | PRN
  Administered 2023-08-20: 2.5 mg via INTRAVENOUS

## 2023-08-20 SURGICAL SUPPLY — 55 items
ADAPTER CARDIO PERF ANTE/RETRO (ADAPTER) ×3 IMPLANT
BAG DECANTER FOR FLEXI CONT (MISCELLANEOUS) ×3 IMPLANT
BLADE CLIPPER SURG (BLADE) ×3 IMPLANT
BLADE STERNUM SYSTEM 6 (BLADE) ×3 IMPLANT
BLADE SURG 11 STRL SS (BLADE) IMPLANT
BLADE SURG 15 STRL LF DISP TIS (BLADE) IMPLANT
CANISTER SUCT 3000ML PPV (MISCELLANEOUS) ×3 IMPLANT
CANNULA MC2 2 STG 36/46 CONN (CANNULA) IMPLANT
CANNULA NON VENT 22FR 12 (CANNULA) IMPLANT
CATH HEART VENT LEFT (CATHETERS) ×3 IMPLANT
CATH RETROPLEGIA CORONARY 14FR (CATHETERS) ×3 IMPLANT
CATH ROBINSON RED A/P 18FR (CATHETERS) ×9 IMPLANT
CATH THOR STR 32F SOFT 20 RADI (CATHETERS) ×3 IMPLANT
CATH THORACIC 28FR RT ANG (CATHETERS) ×3 IMPLANT
CNTNR URN SCR LID CUP LEK RST (MISCELLANEOUS) IMPLANT
COVER CAMERA OR STL DISP (MISCELLANEOUS) IMPLANT
DEVICE SUT CK QUICK LOAD MINI (Prosthesis & Implant Heart) IMPLANT
DRSG AQUACEL AG ADV 3.5X10 (GAUZE/BANDAGES/DRESSINGS) ×3 IMPLANT
ELECT CAUTERY BLADE 6.4 (BLADE) ×3 IMPLANT
ELECTRODE REM PT RTRN 9FT ADLT (ELECTROSURGICAL) ×6 IMPLANT
FELT TEFLON 1X6 (MISCELLANEOUS) ×3 IMPLANT
GAUZE SPONGE 4X4 12PLY STRL (GAUZE/BANDAGES/DRESSINGS) ×6 IMPLANT
GOWN STRL REUS W/ TWL LRG LVL3 (GOWN DISPOSABLE) ×18 IMPLANT
INSERT FOGARTY XLG (MISCELLANEOUS) ×3 IMPLANT
KIT BASIN OR (CUSTOM PROCEDURE TRAY) ×3 IMPLANT
KIT SUCTION CATH 14FR (SUCTIONS) ×3 IMPLANT
KIT SUT CK MINI COMBO 4X17 (Prosthesis & Implant Heart) IMPLANT
KIT TURNOVER KIT B (KITS) ×3 IMPLANT
LINE VENT (MISCELLANEOUS) IMPLANT
NS IRRIG 1000ML POUR BTL (IV SOLUTION) ×15 IMPLANT
ORGANIZER SUTURE GABBAY-FRATER (MISCELLANEOUS) ×3 IMPLANT
PACK E OPEN HEART (SUTURE) ×3 IMPLANT
PACK OPEN HEART (CUSTOM PROCEDURE TRAY) ×3 IMPLANT
PAD ARMBOARD POSITIONER FOAM (MISCELLANEOUS) ×6 IMPLANT
PAD ELECT DEFIB RADIOL ZOLL (MISCELLANEOUS) ×3 IMPLANT
PENCIL BUTTON HOLSTER BLD 10FT (ELECTRODE) ×3 IMPLANT
POSITIONER HEAD DONUT 9IN (MISCELLANEOUS) ×3 IMPLANT
SET MPS 3-ND DEL (MISCELLANEOUS) IMPLANT
SUT EB EXC GRN/WHT 2-0 V-5 (SUTURE) ×6 IMPLANT
SUT MNCRL AB 4-0 PS2 18 (SUTURE) ×6 IMPLANT
SUT PROLENE 4 0 SH DA (SUTURE) ×3 IMPLANT
SUT PROLENE 4-0 RB1 .5 CRCL 36 (SUTURE) IMPLANT
SUT STEEL 6MS V (SUTURE) IMPLANT
SUT STEEL SZ 6 DBL 3X14 BALL (SUTURE) ×6 IMPLANT
SUT VIC AB 0 CTX36XBRD ANTBCTR (SUTURE) ×6 IMPLANT
SUT VIC AB 2-0 CT1 TAPERPNT 27 (SUTURE) ×6 IMPLANT
SYSTEM SAHARA CHEST DRAIN ATS (WOUND CARE) ×3 IMPLANT
TAPE CLOTH SURG 4X10 WHT LF (GAUZE/BANDAGES/DRESSINGS) IMPLANT
TAPE PAPER 2X10 WHT MICROPORE (GAUZE/BANDAGES/DRESSINGS) IMPLANT
TOWEL GREEN STERILE (TOWEL DISPOSABLE) ×3 IMPLANT
TOWEL GREEN STERILE FF (TOWEL DISPOSABLE) ×3 IMPLANT
TRAY FOLEY SLVR 16FR TEMP STAT (SET/KITS/TRAYS/PACK) ×3 IMPLANT
UNDERPAD 30X36 HEAVY ABSORB (UNDERPADS AND DIAPERS) ×3 IMPLANT
VALVE AORTIC SZ25 INSP/RESIL (Prosthesis & Implant Heart) IMPLANT
WATER STERILE IRR 1000ML POUR (IV SOLUTION) ×6 IMPLANT

## 2023-08-20 NOTE — Anesthesia Procedure Notes (Signed)
 Central Venous Catheter Insertion Performed by: Arvie Latus, MD, anesthesiologist Start/End4/24/2025 7:04 AM, 08/20/2023 7:18 AM Patient location: Pre-op. Preanesthetic checklist: patient identified, IV checked, site marked, risks and benefits discussed, surgical consent, monitors and equipment checked, pre-op evaluation, timeout performed and anesthesia consent Lidocaine  1% used for infiltration and patient sedated Hand hygiene performed  and maximum sterile barriers used  Catheter size: 8.5 Fr Sheath introducer Procedure performed using ultrasound guided technique. Ultrasound Notes:anatomy identified, needle tip was noted to be adjacent to the nerve/plexus identified, no ultrasound evidence of intravascular and/or intraneural injection and image(s) printed for medical record Attempts: 1 Following insertion, line sutured and dressing applied. Post procedure assessment: blood return through all ports, free fluid flow and no air  Patient tolerated the procedure well with no immediate complications.

## 2023-08-20 NOTE — Brief Op Note (Signed)
 08/20/2023  10:47 AM  PATIENT:  Stephen Koch  63 y.o. male  PRE-OPERATIVE DIAGNOSIS:  Severe Aortic Stenosis  POST-OPERATIVE DIAGNOSIS:  Severe Aortic Stenosis  PROCEDURE:  TRANSESOPHAGEAL ECHOCARDIOGRAM, REPLACEMENT, AORTIC VALVE REPLACEMENT (using an Inspiris, Model # 11500A, Serial # [id number], Size 25 mm)  SURGEON:  Surgeons and Role:    Melene Sportsman, MD - Primary  PHYSICIAN ASSISTANT: Moira Andrews PA-C  ASSISTANTS: Electa Grieve Price RNFA   ANESTHESIA:   general  EBL:  Per anesthesia, perfusion record  DRAINS:  Chest tubes placed in the mediastinal and pleural spaces    SPECIMEN:  Source of Specimen:  Native aortic valve leaflets  DISPOSITION OF SPECIMEN:  PATHOLOGY  COUNTS CORRECT:  YES  DICTATION: .Dragon Dictation  PLAN OF CARE: Admit to inpatient   PATIENT DISPOSITION:  ICU - intubated and hemodynamically stable.   Delay start of Pharmacological VTE agent (>24hrs) due to surgical blood loss or risk of bleeding: yes  BASELINE WEIGHT: 93 kg

## 2023-08-20 NOTE — Anesthesia Procedure Notes (Signed)
 Arterial Line Insertion Start/End4/24/2025 6:45 AM, 08/20/2023 6:50 AM Performed by: Robert Chimes, CRNA, CRNA  Patient location: Pre-op. Preanesthetic checklist: patient identified, IV checked, site marked, risks and benefits discussed, surgical consent, monitors and equipment checked, pre-op evaluation, timeout performed and anesthesia consent Lidocaine  1% used for infiltration Left, radial was placed Catheter size: 20 G Hand hygiene performed  and maximum sterile barriers used   Attempts: 1 Procedure performed without using ultrasound guided technique. Following insertion, dressing applied and Biopatch. Post procedure assessment: normal and unchanged  Patient tolerated the procedure well with no immediate complications.

## 2023-08-20 NOTE — Anesthesia Preprocedure Evaluation (Signed)
 Anesthesia Evaluation  Patient identified by MRN, date of birth, ID band Patient awake    Reviewed: Allergy & Precautions, NPO status , Patient's Chart, lab work & pertinent test results  History of Anesthesia Complications Negative for: history of anesthetic complications  Airway Mallampati: I  TM Distance: >3 FB Neck ROM: Full    Dental  (+) Edentulous Upper, Edentulous Lower, Dental Advisory Given   Pulmonary neg shortness of breath, neg sleep apnea, neg COPD, neg recent URI, Current Smoker and Patient abstained from smoking.   breath sounds clear to auscultation       Cardiovascular hypertension, Pt. on medications (-) angina + Peripheral Vascular Disease  (-) Past MI + Valvular Problems/Murmurs AS and AI  Rhythm:Regular + Systolic murmurs  1. Left ventricular ejection fraction, by estimation, is 60 to 65%. The  left ventricle has normal function. The left ventricle has no regional  wall motion abnormalities. There is severe asymmetric left ventricular  hypertrophy of the septal segment. Left   ventricular diastolic parameters are consistent with Grade I diastolic  dysfunction (impaired relaxation). Elevated left ventricular end-diastolic  pressure.   2. Right ventricular systolic function is normal. The right ventricular  size is normal. Tricuspid regurgitation signal is inadequate for assessing  PA pressure.   3. Left atrial size was moderately dilated.   4. Right atrial size was moderately dilated.   5. The mitral valve is normal in structure. Trivial mitral valve  regurgitation. No evidence of mitral stenosis.   6. The aortic valve is tricuspid. There is mild restriction in the  mobility of aortic valve leaflets. There is severe calcifcation of the  aortic valve. Aortic valve regurgitation is mild to moderate, eccentric  jet, visually but mild in severity based on   PHT. Severe aortic valve stenosis based on velcoities  but mild aortic  valve stenosis based on aortic valve planimetry which is 1.8 cm2. Aortic  regurgitation PHT measures 510 msec. Aortic valve area, by VTI measures  1.04 cm. Aortic valve mean gradient  measures 45.0 mmHg. Aortic valve Vmax measures 4.52 m/s. DVI is 0.25.   7. Aortic dilatation noted. There is mild dilatation of the aortic root,  measuring 41 mm.   8. The inferior vena cava is normal in size with greater than 50%  respiratory variability, suggesting right atrial pressure of 3 mmHg.   9. Increased flow velocities may be secondary to anemia, thyrotoxicosis,  hyperdynamic or high flow state.      Neuro/Psych negative neurological ROS  negative psych ROS   GI/Hepatic Neg liver ROS, PUD,GERD  ,,  Endo/Other  negative endocrine ROS    Renal/GU negative Renal ROS     Musculoskeletal  (+) Arthritis ,    Abdominal   Peds  Hematology negative hematology ROS (+)   Anesthesia Other Findings   Reproductive/Obstetrics                              Anesthesia Physical Anesthesia Plan  ASA: 4  Anesthesia Plan: General   Post-op Pain Management:    Induction: Intravenous  PONV Risk Score and Plan: 2 and Ondansetron   Airway Management Planned: Oral ETT  Additional Equipment: Arterial line, TEE, PA Cath and Ultrasound Guidance Line Placement  Intra-op Plan:   Post-operative Plan: Post-operative intubation/ventilation  Informed Consent: I have reviewed the patients History and Physical, chart, labs and discussed the procedure including the risks, benefits and alternatives for the proposed  anesthesia with the patient or authorized representative who has indicated his/her understanding and acceptance.     Dental advisory given  Plan Discussed with: CRNA  Anesthesia Plan Comments:          Anesthesia Quick Evaluation

## 2023-08-20 NOTE — Procedures (Signed)
 Extubation Procedure Note  Patient Details:   Name: Stephen Koch DOB: 05-Feb-1961 MRN: [id number]   Airway Documentation:    Vent end date: 08/20/23 Vent end time: 1720   Evaluation  O2 sats: stable throughout Complications: No apparent complications Patient did tolerate procedure well. Bilateral Breath Sounds: Clear, Diminished   Yes   NIF -26 VC 1.4L  Pt extubated to 4l Johnson with RN X2 at bedside. Positive cuff leak noted and pt is tolerating well.  Gustav Lehmann 08/20/2023, 6:05 PM

## 2023-08-20 NOTE — Anesthesia Procedure Notes (Signed)
 Procedure Name: Intubation Date/Time: 08/20/2023 7:54 AM  Performed by: Robert Chimes, CRNAPre-anesthesia Checklist: Patient identified, Emergency Drugs available, Suction available and Patient being monitored Patient Re-evaluated:Patient Re-evaluated prior to induction Oxygen Delivery Method: Circle system utilized Preoxygenation: Pre-oxygenation with 100% oxygen Induction Type: IV induction Ventilation: Mask ventilation without difficulty Laryngoscope Size: Mac and 4 Grade View: Grade I Tube type: Oral Tube size: 8.0 mm Number of attempts: 1 Airway Equipment and Method: Stylet and Oral airway Placement Confirmation: ETT inserted through vocal cords under direct vision, positive ETCO2 and breath sounds checked- equal and bilateral Secured at: 23 cm Tube secured with: Tape Dental Injury: Teeth and Oropharynx as per pre-operative assessment

## 2023-08-20 NOTE — Progress Notes (Signed)
  Echocardiogram Echocardiogram Transesophageal has been performed.  Stephen Koch 08/20/2023, 10:10 AM

## 2023-08-20 NOTE — Consult Note (Signed)
 NAME:  Stephen Koch, MRN:  [id number], DOB:  [birth year], LOS: 0 ADMISSION DATE:  08/20/2023, CONSULTATION DATE: 08/20/2023 REFERRING MD: Dr. Melene Sportsman, CHIEF COMPLAINT: Status post aortic valve replacement  History of Present Illness:  63 year old male with active tobacco dependence, hypertension, hyperlipidemia and BPH who started with dyspnea on exertion.  He was evaluated with echocardiogram which showed severe aortic stenosis with dilated ascending aorta 4.3 cm.  Also noted to have moderate pulmonary hypertension but no coronary artery disease, today he underwent tissue prosthetic aortic valve replacement.  Postprocedure patient was transferred to ICU, PCCM was consulted fibrillation medical management  Pertinent  Medical History   Past Medical History:  Diagnosis Date   Arthritis    Chronic back pain    Forklift injury   Essential hypertension, benign    GERD (gastroesophageal reflux disease)    Heart murmur    History of medication noncompliance      Significant Hospital Events: Including procedures, antibiotic start and stop dates in addition to other pertinent events     Interim History / Subjective:  As above  Objective   Blood pressure 103/72, pulse (!) 54, temperature (!) 95.7 F (35.4 C), temperature source Core, resp. rate 16, height 6\' 3"  (1.905 m), weight 93 kg, SpO2 97%. PAP: (25-26)/(15-16) 25/15 CO:  [2.8 L/min] 2.8 L/min CI:  [1.27 L/min/m2] 1.27 L/min/m2  Vent Mode: PRVC;PSV;SIMV FiO2 (%):  [50 %] 50 % Set Rate:  [16 bmp] 16 bmp Vt Set:  [670 mL] 670 mL PEEP:  [5 cmH20] 5 cmH20 Pressure Support:  [10 cmH20] 10 cmH20   Intake/Output Summary (Last 24 hours) at 08/20/2023 1322 Last data filed at 08/20/2023 1300 Gross per 24 hour  Intake 3160 ml  Output 1160 ml  Net 2000 ml   Filed Weights   08/20/23 0554  Weight: 93 kg    Examination: General: Crtitically ill-appearing male, orally intubated HEENT: [place]/AT, eyes anicteric.  ETT and OGT in  place Neuro: Sedated, not following commands.  Eyes are closed.  Pupils 3 mm bilateral reactive to light Chest: Central sternotomy incision looks clean and dry, coarse breath sounds, no wheezes or rhonchi.  Mediastinal and chest tube in place. pacer wire noted Heart: Bradycardic, regular rhythm, no murmurs or gallops Abdomen: Soft, nondistended, bowel sounds present Skin: No rash  Labs and images reviewed  On ventilator patient was noted to have low expiratory tidal volume > 100, there were no chest tube leak.  ET tube balloon started leaking after filling air  Resolved Hospital Problem list     Assessment & Plan:  Severe aortic stenosis status post bioprosthetic aortic valve replacement Ascending aortic aneurysm 4.3 cm Continue aspirin  and statin Continue prophylactic amiodarone  Chest tube management TCTS Continue to titrate Precedex  with RASS goal 0/-1 Continue pain control with tramadol , oxycodone  and morphine  Closely monitor chest tube output  Acute respiratory insufficiency, postop Continue on protective ventilation VAP prevention bundle in place Rapid weaning protocol ordered is in place ETT balloon is leaking, will require ETT exchange  Chronic HFpEF Monitor intake and output EF 60 to 65% with grade 1 diastolic dysfunction GDMT once able to tolerate  Hypertension Holding antihypertensive for now, as patient is requiring low-dose phenylephrine  and epinephrine   Hyperlipidemia Continue atorvastatin   Tobacco dependence Smoking cessation counseling when appropriate  Expected perioperative blood loss anemia Monitor H/H and PLT counts   Best Practice (right click and "Reselect all SmartList Selections" daily)   Diet/type: NPO DVT prophylaxis: SCD GI prophylaxis: PPI  Lines: Central line, Arterial Line, and yes and it is still needed Foley:  Yes, and it is still needed Code Status:  full code Last date of multidisciplinary goals of care discussion [Per primary  team]   Labs   CBC: Recent Labs  Lab 08/18/23 1118 08/20/23 0807 08/20/23 1002 08/20/23 1022 08/20/23 1052 08/20/23 1057 08/20/23 1203  WBC 6.2  --   --   --   --   --  15.0*  HGB 15.9   < > 10.8* 11.2* 11.2* 11.2* 12.2*  HCT 49.0   < > 32.5* 33.0* 33.0* 33.0* 37.0*  MCV 90.1  --   --   --   --   --  90.0  PLT 278  --  184  --   --   --  150   < > = values in this interval not displayed.    Basic Metabolic Panel: Recent Labs  Lab 08/18/23 1118 08/20/23 0807 08/20/23 0811 08/20/23 0839 08/20/23 0856 08/20/23 0922 08/20/23 0929 08/20/23 1022 08/20/23 1052 08/20/23 1057  NA 140   < > 140 139   < > 139 137 137 139 138  K 3.9   < > 3.7 3.7   < > 4.8 4.7 5.6* 4.2 4.2  CL 102  --  103 104  --   --  104 104  --  105  CO2 29  --   --   --   --   --   --   --   --   --   GLUCOSE 87  --  104* 104*  --   --  121* 150*  --  164*  BUN 15  --  14 13  --   --  13 14  --  12  CREATININE 0.97  --  0.70 0.70  --   --  0.60* 0.70  --  0.70  CALCIUM  9.4  --   --   --   --   --   --   --   --   --    < > = values in this interval not displayed.   GFR: Estimated Creatinine Clearance: 110 mL/min (by C-G formula based on SCr of 0.7 mg/dL). Recent Labs  Lab 08/18/23 1118 08/20/23 1203  WBC 6.2 15.0*    Liver Function Tests: Recent Labs  Lab 08/18/23 1118  AST 19  ALT 12  ALKPHOS 69  BILITOT 1.0  PROT 7.7  ALBUMIN  3.8   No results for input(s): "LIPASE", "AMYLASE" in the last 168 hours. No results for input(s): "AMMONIA" in the last 168 hours.  ABG    Component Value Date/Time   PHART 7.326 (L) 08/20/2023 1052   PCO2ART 45.8 08/20/2023 1052   PO2ART 284 (H) 08/20/2023 1052   HCO3 23.9 08/20/2023 1052   TCO2 25 08/20/2023 1057   ACIDBASEDEF 2.0 08/20/2023 1052   O2SAT 100 08/20/2023 1052     Coagulation Profile: Recent Labs  Lab 08/18/23 1118 08/20/23 1203  INR 1.1 1.6*    Cardiac Enzymes: No results for input(s): "CKTOTAL", "CKMB", "CKMBINDEX",  "TROPONINI" in the last 168 hours.  HbA1C: Hgb A1c MFr Bld  Date/Time Value Ref Range Status  08/18/2023 11:18 AM 4.7 (L) 4.8 - 5.6 % Final    Comment:    (NOTE) Pre diabetes:          5.7%-6.4%  Diabetes:              >6.4%  Glycemic  control for   <7.0% adults with diabetes     CBG: Recent Labs  Lab 08/20/23 1205 08/20/23 1308  GLUCAP 131* 147*    Review of Systems:   Unable to obtain as patient is intubated and sedated  Past Medical History:  He,  has a past medical history of Arthritis, Chronic back pain, Essential hypertension, benign, GERD (gastroesophageal reflux disease), Heart murmur, and History of medication noncompliance.   Surgical History:   Past Surgical History:  Procedure Laterality Date   ABDOMINAL AORTOGRAM N/A 06/18/2023   Procedure: ABDOMINAL AORTOGRAM;  Surgeon: Kyra Phy, MD;  Location: [facility] INVASIVE CV LAB;  Service: Cardiovascular;  Laterality: N/A;   BIOPSY  01/04/2019   Procedure: BIOPSY;  Surgeon: Alyce Jubilee, MD;  Location: [facility] ENDO SUITE;  Service: Endoscopy;;  gastric   COLONOSCOPY N/A 07/06/2012   GUY:QIHKVQ mucosa in the terminal ileum/Moderate sized internal hemorrhoids   COLONOSCOPY WITH PROPOFOL  N/A 01/04/2019   normal TI, internal hemorrhoids Grade 3 s/p band placement X 3.    ESOPHAGOGASTRODUODENOSCOPY N/A 04/08/2013   QVZ:DGLOV gastric ulcer/duodenal inflammation. bx with chronic active gastritis with focal intestinal metaplasia, ulceration and H pylori   ESOPHAGOGASTRODUODENOSCOPY (EGD) WITH PROPOFOL  N/A 01/04/2019   moderate gastritis s/p biopsy, negative Hpylori   HEMORRHOID BANDING N/A 01/04/2019   Procedure: HEMORRHOID BANDING;  Surgeon: Alyce Jubilee, MD;  Location: [facility] ENDO SUITE;  Service: Endoscopy;  Laterality: N/A;   Left knee surgery Left    arthroscopy   QUADRICEPS TENDON REPAIR Left 03/05/2017   Procedure: REPAIR QUADRICEP TENDON;  Surgeon: Darrin Emerald, MD;  Location: [facility] ORS;  Service: Orthopedics;   Laterality: Left;   Right arm surgery     tendon repair   RIGHT HEART CATH AND CORONARY ANGIOGRAPHY N/A 06/18/2023   Procedure: RIGHT HEART CATH AND CORONARY ANGIOGRAPHY;  Surgeon: Kyra Phy, MD;  Location: [facility] INVASIVE CV LAB;  Service: Cardiovascular;  Laterality: N/A;     Social History:   reports that he has been smoking cigars. He has never used smokeless tobacco. He reports current alcohol  use of about 3.0 - 5.0 standard drinks of alcohol  per week. He reports current drug use. Drug: Marijuana.   Family History:  His family history includes Heart disease in his father. There is no history of Colon cancer or Colon polyps.   Allergies Allergies  Allergen Reactions   Ibuprofen  Other (See Comments)    Causes gastric bleeding     Home Medications  Prior to Admission medications   Medication Sig Start Date End Date Taking? Authorizing Provider  amLODipine  (NORVASC ) 10 MG tablet Take 10 mg by mouth daily.   Yes [provider]  aspirin  EC 81 MG tablet Take 1 tablet (81 mg total) by mouth daily. Swallow whole. 12/10/22  Yes Mallipeddi, Vishnu P, MD  Aspirin -Caffeine (BAYER BACK & BODY) 500-32.5 MG TABS Take 1-2 tablets by mouth daily as needed (pain).   Yes [provider]  atorvastatin  (LIPITOR) 40 MG tablet Take 40 mg by mouth daily. 12/08/22  Yes [provider]  gabapentin  (NEURONTIN ) 300 MG capsule Take 300 mg by mouth 2 (two) times daily.  05/11/19  Yes [provider]  methocarbamol  (ROBAXIN ) 500 MG tablet Take 500 mg by mouth 2 (two) times daily. 04/14/19  Yes [provider]  Multiple Vitamin (MULTIVITAMIN WITH MINERALS) TABS tablet Take 1 tablet by mouth daily.   Yes [provider]  naproxen (NAPROSYN) 500 MG tablet Take 500 mg by mouth  2 (two) times daily as needed for moderate pain (pain score 4-6). 06/08/23  Yes [provider]  omeprazole  (PRILOSEC) 20 MG capsule Take 1 capsule (20 mg total) by mouth daily.  TAKE ONE CAPSULE BY MOUTH BEFORE BREAKFAST May take additional capsule before supper if needed 01/15/23 01/15/24 Yes Carver, Charles K, DO  tamsulosin  (FLOMAX ) 0.4 MG CAPS capsule Take 0.4 mg by mouth daily. 11/11/21  Yes [provider]  metoprolol  tartrate (LOPRESSOR ) 25 MG tablet Take 1 tablet (25 mg total) by mouth 2 hours prior to CT scans to slow heart rate as directed. Patient not taking: Reported on 08/11/2023 06/18/23 06/17/24  Ardia Kraft, PA-C     Critical care time:     The patient is critically ill due to severe aortic stenosis status post aortic valve replacement.  Acute respiratory insufficiency requiring titration of ventilator.  Critical care was necessary to treat or prevent imminent or life-threatening deterioration.  Critical care was time spent personally by me on the following activities: development of treatment plan with patient and/or surrogate as well as nursing, discussions with consultants, evaluation of patient's response to treatment, examination of patient, obtaining history from patient or surrogate, ordering and performing treatments and interventions, ordering and review of laboratory studies, ordering and review of radiographic studies, pulse oximetry, re-evaluation of patient's condition and participation in multidisciplinary rounds.   During this encounter critical care time was devoted to patient care services described in this note for 37 minutes.     Trevor Fudge, MD [facility] See Amion for pager If no response to pager, please call [phone] until 7pm After 7pm, Please call E-link [phone]

## 2023-08-20 NOTE — Anesthesia Procedure Notes (Signed)
 Central Venous Catheter Insertion Performed by: Arvie Latus, MD, anesthesiologist Start/End4/24/2025 7:04 AM, 08/20/2023 7:18 AM Patient location: Pre-op. Preanesthetic checklist: patient identified, IV checked, site marked, risks and benefits discussed, surgical consent, monitors and equipment checked, pre-op evaluation, timeout performed and anesthesia consent Hand hygiene performed  and maximum sterile barriers used  PA cath was placed.Swan type:thermodilution PA Cath depth:42 Procedure performed without using ultrasound guided technique. Attempts: 1 Patient tolerated the procedure well with no immediate complications.

## 2023-08-20 NOTE — Op Note (Signed)
 CARDIOVASCULAR SURGERY OPERATIVE NOTE  08/20/2023 Stephen Koch [id number]  Surgeon:  Wiley Hanger, MD  First Assistant: Moira Andrews [facility]                               An experienced assistant was required given the complexity of this surgery and the standard of surgical care. The assistant was needed for exposure, dissection, suctioning, retraction of delicate tissues and sutures, instrument exchange and for overall help during this procedure.     Preoperative Diagnosis:  Aortic Stenosis and ascending aortic anuerysm  Postoperative Diagnosis:  Same   Procedure: Aortic valve replacement with a 25mm Inspiris Pericardial valve  Anesthesia:  General Endotracheal   Clinical History/Surgical Indication:  63 yo male with NYHA class 1-2 symptoms of severe AS with normal LV function and no CAD but with moderate PHTN. His PFTs are not an issue. He has a 43mm ascending aorta. It would be best for pt to have SAVR with evaluation of his ascending aorta at time of surgery for replacement    Findings: The left ventricle was severely hypertrophic.  The aortic valve was trileaflet and with heavy calcification.  The ascending aorta measured 39 to 41 mm by TEE and was felt not to require replacement.  At the conclusion of the procedure there was a well-seated aortic valve replacement with normal ventricular function and normal sinus rhythm with a mean gradient of 13 mmHg.   Preparation:  The patient was seen in the preoperative holding area and the correct patient, correct operation were confirmed with the patient after reviewing the medical record and catheterization. The consent was signed by me. Preoperative antibiotics were given. A pulmonary arterial line and radial arterial line were placed by the anesthesia team. The patient was taken back to the operating room and positioned supine on the operating room table. After being placed under general endotracheal anesthesia by the anesthesia  team a foley catheter was placed. The neck, chest, abdomen, and both legs were prepped with betadine  soap and solution and draped in the usual sterile manner. A surgical time-out was taken and the correct patient and operative procedure were confirmed with the nursing and anesthesia staff.  Operation: Median sternotomy incision was then created sternal valve the sternal saw.  Simultaneously heparin  was delivered in the pericardial well developed.  The second ascending aorta was cannulated with a 22 [place] Starns aortic cannula and a two-stage venous cannulas placed in the right atrial appendage and directed towards the inferior vena cava.  With adequate confirmation of anticoagulation cardiopulmonary bypass was instituted. Antegrade and retrograde cardioplegia catheters were placed in ascending aorta and coronary sinus effectively.  Left ventricular sump was placed across right superior pulmonary vein. Aortic cross-clamp was placed.  Attempted retrograde cardioplegia was performed however the cannula dislodged and was removed.  The ascending aorta was then opened and retracted and utilizing direct coronary perfusion cold blood [place] cardioplegia was delivered for a liter down the left main coronary and 300 cc down to the right coronary artery.  A reanimation dose was delivered just prior to cross-clamp removal. Aortic valve was identified and photographed and resected.  The annulus was decalcified.  Utilizing seventeen 2-0 [place] pledgeted sutures with the pledgets on the ventricular surface the 25 mm Inspiris pericardial valve was secured with the cor knot system.  The aorta was then reconstructed with a running 4 oh pledgeted Prolene suture with the patient in headdown  position aortic cross-clamp was removed. Ventricular and atrial pacing wires were placed around the inferior stab wounds and secured.  Patient was de-aired and following this was weaned from cardiopulmonary bypass on epinephrine  support.   With adequate hemodynamics protamine  was delivered and the patient was decannulated and sites oversewn were necessary. With adequate hemostasis chest tubes were brought inferior stab was and secured the sternum was reapproximated with interrupted standstill wire and the presternal subcutaneous tissue and skin were closed in multiple layers absorbable suture.  Sterile dressings were applied.

## 2023-08-20 NOTE — Procedures (Signed)
 Intubation Procedure Note  TJ KITCHINGS  [id number]  1960/10/11  Date:08/20/23  Time:1:32 PM   Provider Performing:Chinonso Linker    Procedure: Intubation (31500)  Indication(s) Respiratory Failure  Consent Unable to obtain consent due to emergent nature of procedure.   Anesthesia Versed  and morphine    Time Out Verified patient identification, verified procedure, site/side was marked, verified correct patient position, special equipment/implants available, medications/allergies/relevant history reviewed, required imaging and test results available.   Sterile Technique Usual hand hygeine, masks, and gloves were used   Procedure Description Patient positioned in bed supine.  Sedation given as noted above.  Prior ET tube was taken out, patient was intubated with endotracheal tube using  direct laryngoscopy .  View was Grade 1 full glottis .  Number of attempts was 1.  Colorimetric CO2 detector was consistent with tracheal placement.   Complications/Tolerance None; patient tolerated the procedure well. Chest X-ray is ordered to verify placement.   EBL Minimal   Specimen(s) None

## 2023-08-20 NOTE — Discharge Summary (Signed)
 [address]       [place] [address]             [phone]    Physician Discharge Summary  Patient ID: Stephen Koch MRN: [id number] DOB/AGE: 09-12-1960 63 y.o.  Admit date: 08/20/2023 Discharge date: 08/25/2023  Admission Diagnoses:  Patient Active Problem List   Diagnosis Date Noted   S/P aortic valve replacement with bioprosthetic valve 08/20/2023   Severe aortic stenosis 08/20/2023   LVH (left ventricular hypertrophy) 01/23/2023   Aortic regurgitation 12/10/2022   Aortic stenosis 12/10/2022   Nicotine abuse 12/10/2022   Aneurysm of ascending aorta without rupture (HCC) 12/10/2022   GERD (gastroesophageal reflux disease) 07/20/2019   PUD (peptic ulcer disease)    Constipation 10/20/2018   Rectal bleeding 10/20/2018   Rupture of left quadriceps tendon s/p repair 03/05/17    Helicobacter pylori gastritis 04/18/2015   ETOH abuse 04/18/2015   Liver lesion 04/18/2015   Essential hypertension, benign 11/04/2013   Heart murmur 11/04/2013   Back pain associated with peripheral numbness 08/10/2013   Upper abdominal pain 03/31/2013   Hematochezia 07/01/2012     Discharge Diagnoses:  Patient Active Problem List   Diagnosis Date Noted   S/P aortic valve replacement with bioprosthetic valve 08/20/2023   Severe aortic stenosis 08/20/2023   LVH (left ventricular hypertrophy) 01/23/2023   Aortic regurgitation 12/10/2022   Aortic stenosis 12/10/2022   Nicotine abuse 12/10/2022   Aneurysm of ascending aorta without rupture (HCC) 12/10/2022   GERD (gastroesophageal reflux disease) 07/20/2019   PUD (peptic ulcer disease)    Constipation 10/20/2018   Rectal bleeding 10/20/2018   Rupture of left quadriceps tendon s/p repair 03/05/17    Helicobacter pylori gastritis 04/18/2015   ETOH abuse 04/18/2015   Liver lesion 04/18/2015   Essential hypertension, benign 11/04/2013   Heart murmur 11/04/2013   Back pain associated with peripheral numbness  08/10/2013   Upper abdominal pain 03/31/2013   Hematochezia 07/01/2012     Discharged Condition: stable  HPI: This is a 63 yo male with increasing dyspnea on exertion. Patient is a smoker and has had PFTs without significant pathology. He has had an echocardiogram  now with severe aortic stenosis with a mean gradient of with a tricuspid valve and normal LV function. He has also a dilated ascending aorta which on CT is 43mm. He was evaluated by cardiology and underwent a cath with moderate PHTN but no CAD. He had TAVR CTA with acceptable anatomy for a TAVR; however, wth his age and ascending aortic dilation was felt best by structural heart team to have SAVR. Patient has no chest pain or light headedness nor palpitations. Dr. Honey Lusty discussed the need for median sternotomy for aortic valve replacement and replacement of aorta. Potential risks, benefits, and complications of the surgery were discussed with the patient and he agreed to proceed with surgery. Pre operative carotid duplex US  showed no significant internal carotid artery stenosis bilaterally.  Hospital Course: Patient underwent an aortic valve replacement (size 25 mm) and replacement of ascending aorta. He was transported from the OR to [facility] ICU in stable condition. He was extubated the evening of surgery. [name] and a line were removed on post op day one. Epicardial pacing wires, chest tubes, and foley were removed on post op day 2. He was started on prophylactic Amiodarone  and low dose Lopressor .  He has had PACs and PVCs and beta-blocker has been titrated upward.  He was above  pre op weight so he was diuresed. He was transitioned off the Insulin  drip. His pre op HGA1C was 4.7. Accu checks and SS PRN will be stopped after transfer to the floor. Patient had expected post op blood loss anemia and did not require a transfusion.  Renal function has remained normal and he has been diuresed routinely.  He was stable for transfer from the  ICU to 4E for further convalescence on 04/26. He has been ambulating well with good oxygenation on room air. He has been mostly in sinus rhythm;he did have a run of VT on 04/28. He will be continued on Amiodarone  at discharge. He has been tolerating a diet. He was passing flatus and given a laxative to assist with bowel movement. Sternal wound is clean, dry, healing without signs of infection. Chest tube sutures will be removed today. As discussed with Dr. Honey Lusty, he is stable for discharge today.  Consults: pulmonary/intensive care  Significant Diagnostic Studies:   Narrative & Impression  CLINICAL DATA:  63 year old male postoperative day 2 aortic valve replacement.   EXAM: PORTABLE CHEST 1 VIEW   COMPARISON:  Portable chest 08/21/2023 and earlier.   FINDINGS: Portable AP semi upright view at 0517 hours. Swan-Ganz catheter removed, left IJ introducer sheath remains. Chest/mediastinal tubes remain in the midline. Stable lung volumes. Stable cardiomegaly and mediastinal contours. No pneumothorax, pulmonary edema. Mild hypo ventilation at the right lung base now. Small pleural effusion is possible there. No consolidation. Stable visualized osseous structures. Visible bowel-gas within normal limits.   IMPRESSION: 1. Swan-Ganz catheter removed. Right IJ introducer sheath and chest/mediastinal tubes remain. 2. Stable cardiomegaly with no pneumothorax or pulmonary edema identified. Possible small right pleural effusion.     Electronically Signed   By: Marlise Simpers M.D.   On: 08/22/2023 08:17    Treatments: Surgery:  Aortic valve replacement with a 25mm Inspiris Pericardial valve by Dr. Honey Lusty on 08/20/2023.  Discharge Exam: Blood pressure 129/83, pulse 74, temperature 98 F (36.7 C), temperature source Oral, resp. rate 17, height 6\' 3"  (1.905 m), weight 91.2 kg, SpO2 96%. Cardiovascular: RRR Pulmonary: Clear to auscultation bilaterally Abdomen: Soft, non tender, bowel sounds  present. Extremities: No lower extremity edema. Wound: Clean and dry.  No erythema or signs of infection.   Discharge Medications:  The patient has been discharged on:   1.Beta Blocker:  Yes [ x  ]                              No   [   ]                              If No, reason:  2.Ace Inhibitor/ARB: Yes [   ]                                     No  [x    ]                                     If No, reason:Labile BP. Hope to start after discharge when BP allows  3.Statin:   Yes [  x ]  No  [   ]                  If No, reason:  4.Ecasa:  Yes  [  x ]                  No   [   ]                  If No, reason:  Patient had ACS upon admission:No  Plavix/P2Y12 inhibitor: Yes [   ]                                      No  [ x  ]     Discharge Instructions     Amb Referral to Cardiac Rehabilitation   Complete by: As directed    Diagnosis: Valve Replacement   Valve: Aortic   After initial evaluation and assessments completed: Virtual Based Care may be provided alone or in conjunction with Phase 2 Cardiac Rehab based on patient barriers.: Yes   Intensive Cardiac Rehabilitation (ICR) [facility] location only OR Traditional Cardiac Rehabilitation (TCR) *If criteria for ICR are not met will enroll in TCR [facility] only): Yes      Allergies as of 08/25/2023       Reactions   Ibuprofen  Other (See Comments)   Causes gastric bleeding        Medication List     STOP taking these medications    amLODipine  10 MG tablet Commonly known as: NORVASC    [employer] 500-32.5 MG Tabs Generic drug: Aspirin -Caffeine       TAKE these medications    amiodarone  200 MG tablet Commonly known as: PACERONE  Take 200 mg by mouth two times daily for 10 days;then take 200 mg by mouth daily thereafter   aspirin  EC 325 MG tablet Take 1 tablet (325 mg total) by mouth daily. What changed:  medication strength how much to take additional instructions   atorvastatin  40  MG tablet Commonly known as: LIPITOR Take 40 mg by mouth daily.   gabapentin  300 MG capsule Commonly known as: NEURONTIN  Take 300 mg by mouth 2 (two) times daily.   methocarbamol  500 MG tablet Commonly known as: ROBAXIN  Take 500 mg by mouth 2 (two) times daily.   metoprolol  tartrate 25 MG tablet Commonly known as: LOPRESSOR  Take 1 tablet (25 mg total) by mouth 2 (two) times daily. What changed: when to take this   multivitamin with minerals Tabs tablet Take 1 tablet by mouth daily.   naproxen 500 MG tablet Commonly known as: NAPROSYN Take 500 mg by mouth 2 (two) times daily as needed for moderate pain (pain score 4-6).   omeprazole  20 MG capsule Commonly known as: PRILOSEC Take 1 capsule (20 mg total) by mouth daily. TAKE ONE CAPSULE BY MOUTH BEFORE BREAKFAST May take additional capsule before supper if needed   oxyCODONE  5 MG immediate release tablet Commonly known as: Oxy IR/ROXICODONE  Take 1 tablet (5 mg total) by mouth every 6 (six) hours as needed for severe pain (pain score 7-10).   tamsulosin  0.4 MG Caps capsule Commonly known as: FLOMAX  Take 0.4 mg by mouth daily.               Durable Medical Equipment  (From admission, onward)           Start     Ordered  08/21/23 1200  For home use only DME 4 wheeled rolling walker with seat  Once       Question Answer Comment  Patient needs a walker to treat with the following condition S/P AVR (aortic valve replacement)   Patient needs a walker to treat with the following condition Physical deconditioning      08/21/23 1200            Follow-up Information     [facility] at [facility] [employer] [facility] [facility] [employer]. Go on 09/24/2023.   Specialty: Cardiothoracic Surgery Why: Appointment time is at 12:30 pm Contact information: [address] [place]  [address] [phone]        Imaging. Go on 09/24/2023.   Why: Please arrive by 11:30 am in order to  have PA/LAT CXR taken PRIOR to office appointemnt wtih TCTS PA Contact information: [address] [place] [address]        [employer] Follow up.   Why: rollator arranged- to be delivered to room prior to discharge Contact information: [phone]        Gennaro Khat, Cadence H, PA-C. Go on 09/16/2023.   Specialty: Cardiology Why: Appointment time is at 2:30 pm Contact information: [address] [place] [address] [phone]         [facility] at [facility] [place] [employer]. Go on 10/01/2023.   Specialty: Cardiology Why: Appointment time is at 9:30 am Contact information: [address] [place]  [address] [phone]                Signed:  Allegra Arch, PA-C 08/25/2023, 9:24 AM

## 2023-08-20 NOTE — Interval H&P Note (Signed)
 History and Physical Interval Note:  08/20/2023 6:26 AM  Stephen Koch  has presented today for surgery, with the diagnosis of Severe AS TAA.  The various methods of treatment have been discussed with the patient and family. After consideration of risks, benefits and other options for treatment, the patient has consented to  Procedure(s): REPLACEMENT, AORTIC VALVE, OPEN (N/A) REPLACEMENT, AORTA, ASCENDING (N/A) ECHOCARDIOGRAM, TRANSESOPHAGEAL (N/A) as a surgical intervention.  The patient's history has been reviewed, patient examined, no change in status, stable for surgery.  I have reviewed the patient's chart and labs.  Questions were answered to the patient's satisfaction.     Melene Sportsman

## 2023-08-20 NOTE — Transfer of Care (Signed)
 Immediate Anesthesia Transfer of Care Note  Patient: Stephen Koch  Procedure(s) Performed: REPLACEMENT, AORTIC VALVE, OPEN USING INSPIRIS RESILIA AORTIC VALVE (Chest) ECHOCARDIOGRAM, TRANSESOPHAGEAL  Patient Location: SICU  Anesthesia Type:General  Level of Consciousness: Patient remains intubated per anesthesia plan  Airway & Oxygen Therapy: Patient remains intubated per anesthesia plan  Post-op Assessment: Report given to RN and Post -op Vital signs reviewed and stable  Post vital signs: Reviewed and stable  Last Vitals:  Vitals Value Taken Time  BP 106/82 08/20/23 1149  Temp 35.6 C 08/20/23 1159  Pulse 47 08/20/23 1159  Resp 16 08/20/23 1159  SpO2 99 % 08/20/23 1159  Vitals shown include unfiled device data.  Last Pain: There were no vitals filed for this visit.       Complications: No notable events documented.

## 2023-08-21 ENCOUNTER — Encounter (HOSPITAL_COMMUNITY): Payer: Self-pay | Admitting: Thoracic Surgery (Cardiothoracic Vascular Surgery)

## 2023-08-21 ENCOUNTER — Other Ambulatory Visit: Payer: Self-pay

## 2023-08-21 ENCOUNTER — Inpatient Hospital Stay (HOSPITAL_COMMUNITY)

## 2023-08-21 DIAGNOSIS — F17210 Nicotine dependence, cigarettes, uncomplicated: Secondary | ICD-10-CM | POA: Diagnosis not present

## 2023-08-21 DIAGNOSIS — E785 Hyperlipidemia, unspecified: Secondary | ICD-10-CM | POA: Diagnosis not present

## 2023-08-21 DIAGNOSIS — I5032 Chronic diastolic (congestive) heart failure: Secondary | ICD-10-CM | POA: Diagnosis not present

## 2023-08-21 DIAGNOSIS — I10 Essential (primary) hypertension: Secondary | ICD-10-CM | POA: Diagnosis not present

## 2023-08-21 LAB — BASIC METABOLIC PANEL WITH GFR
Anion gap: 5 (ref 5–15)
Anion gap: 7 (ref 5–15)
BUN: 9 mg/dL (ref 8–23)
BUN: 13 mg/dL (ref 8–23)
CO2: 24 mmol/L (ref 22–32)
CO2: 28 mmol/L (ref 22–32)
Calcium: 7.9 mg/dL — ABNORMAL LOW (ref 8.9–10.3)
Calcium: 8.4 mg/dL — ABNORMAL LOW (ref 8.9–10.3)
Chloride: 106 mmol/L (ref 98–111)
Chloride: 101 mmol/L (ref 98–111)
Creatinine, Ser: 0.75 mg/dL (ref 0.61–1.24)
Creatinine, Ser: 1.01 mg/dL (ref 0.61–1.24)
GFR, Estimated: >60 mL/min (ref >60)
GFR, Estimated: >60 mL/min (ref >60)
Glucose, Bld: 104 mg/dL — ABNORMAL HIGH (ref 70–99)
Glucose, Bld: 116 mg/dL — ABNORMAL HIGH (ref 70–99)
Potassium: 4.2 mmol/L (ref 3.5–5.1)
Potassium: 4.2 mmol/L (ref 3.5–5.1)
Sodium: 135 mmol/L (ref 135–145)
Sodium: 136 mmol/L (ref 135–145)

## 2023-08-21 LAB — CBC
HCT: 34 % — ABNORMAL LOW (ref 39.0–52.0)
HCT: 36.2 % — ABNORMAL LOW (ref 39.0–52.0)
Hemoglobin: 11.1 g/dL — ABNORMAL LOW (ref 13.0–17.0)
Hemoglobin: 11.7 g/dL — ABNORMAL LOW (ref 13.0–17.0)
MCH: 29.2 pg (ref 26.0–34.0)
MCH: 29 pg (ref 26.0–34.0)
MCHC: 32.6 g/dL (ref 30.0–36.0)
MCHC: 32.3 g/dL (ref 30.0–36.0)
MCV: 89.5 fL (ref 80.0–100.0)
MCV: 89.8 fL (ref 80.0–100.0)
Platelets: 166 10*3/uL (ref 150–400)
Platelets: 177 10*3/uL (ref 150–400)
RBC: 3.8 MIL/uL — ABNORMAL LOW (ref 4.22–5.81)
RBC: 4.03 MIL/uL — ABNORMAL LOW (ref 4.22–5.81)
RDW: 13.6 % (ref 11.5–15.5)
RDW: 13.7 % (ref 11.5–15.5)
WBC: 10.2 10*3/uL (ref 4.0–10.5)
WBC: 11.6 10*3/uL — ABNORMAL HIGH (ref 4.0–10.5)
nRBC: 0 % (ref 0.0–0.2)
nRBC: 0 % (ref 0.0–0.2)

## 2023-08-21 LAB — PREPARE FRESH FROZEN PLASMA: Unit division: 0

## 2023-08-21 LAB — GLUCOSE, CAPILLARY
Glucose-Capillary: 93 mg/dL (ref 70–99)
Glucose-Capillary: 101 mg/dL — ABNORMAL HIGH (ref 70–99)
Glucose-Capillary: 167 mg/dL — ABNORMAL HIGH (ref 70–99)
Glucose-Capillary: 115 mg/dL — ABNORMAL HIGH (ref 70–99)
Glucose-Capillary: 113 mg/dL — ABNORMAL HIGH (ref 70–99)
Glucose-Capillary: 140 mg/dL — ABNORMAL HIGH (ref 70–99)
Glucose-Capillary: 112 mg/dL — ABNORMAL HIGH (ref 70–99)

## 2023-08-21 LAB — BPAM FFP
Blood Product Expiration Date: 202504252359
Blood Product Expiration Date: 202504252359
ISSUE DATE / TIME: 202504241302
ISSUE DATE / TIME: 202504241306
Unit Type and Rh: 6200
Unit Type and Rh: 6200

## 2023-08-21 LAB — MAGNESIUM
Magnesium: 2.4 mg/dL (ref 1.7–2.4)
Magnesium: 2.1 mg/dL (ref 1.7–2.4)

## 2023-08-21 MED ORDER — INSULIN ASPART 100 UNIT/ML IJ SOLN: 0.0000 [IU] | INTRAMUSCULAR | Status: DC

## 2023-08-21 MED ORDER — FUROSEMIDE 10 MG/ML IJ SOLN
40.0000 mg | Freq: Once | INTRAMUSCULAR | Status: AC
  Administered 2023-08-21: 40 mg via INTRAVENOUS
  Filled 2023-08-21: qty 4

## 2023-08-21 MED ORDER — FLUTICASONE FUROATE-VILANTEROL 100-25 MCG/ACT IN AEPB
1.0000 | INHALATION_SPRAY | Freq: Every day | RESPIRATORY_TRACT | Status: DC
  Administered 2023-08-21 – 2023-08-25 (×4): 1 via RESPIRATORY_TRACT
  Filled 2023-08-21: qty 28

## 2023-08-21 MED ORDER — ENOXAPARIN SODIUM 40 MG/0.4ML IJ SOSY
40.0000 mg | PREFILLED_SYRINGE | Freq: Every day | INTRAMUSCULAR | Status: DC
  Administered 2023-08-21 – 2023-08-24 (×4): 40 mg via SUBCUTANEOUS
  Filled 2023-08-21 (×4): qty 0.4

## 2023-08-21 MED ORDER — INSULIN ASPART 100 UNIT/ML IJ SOLN
0.0000 [IU] | INTRAMUSCULAR | Status: DC
  Administered 2023-08-21: 4 [IU] via SUBCUTANEOUS
  Administered 2023-08-21 – 2023-08-22 (×2): 2 [IU] via SUBCUTANEOUS

## 2023-08-21 MED ORDER — ALBUTEROL SULFATE (2.5 MG/3ML) 0.083% IN NEBU: 2.5000 mg | INHALATION_SOLUTION | RESPIRATORY_TRACT | Status: DC | PRN

## 2023-08-21 MED FILL — Heparin Sodium (Porcine) Inj 1000 Unit/ML: Qty: 1000 | Status: AC

## 2023-08-21 MED FILL — Lidocaine HCl Local Preservative Free (PF) Inj 2%: INTRAMUSCULAR | Qty: 14 | Status: AC

## 2023-08-21 MED FILL — Cefazolin Sodium-Dextrose IV Solution 2 GM/100ML-4%: INTRAVENOUS | Qty: 100 | Status: AC

## 2023-08-21 MED FILL — Potassium Chloride Inj 2 mEq/ML: INTRAVENOUS | Qty: 40 | Status: AC

## 2023-08-21 NOTE — TOC Initial Note (Addendum)
 Transition of Care [facility]) - Initial/Assessment Note    Patient Details  Name: Stephen Koch MRN: [id number] Date of Birth: 09/19/1960  [facility] CM/SW Contact:    Benjiman Bras, RN Phone Number: [phone] 08/21/2023, 11:51 AM  Clinical Narrative:                  TOC CM spoke to pt and wife at bedside. Pt states he will need RW for home. Offered choice for [facility] [place]. Medicare.gov list with rating placed on chart and provide to pt. Wife agreeable to [facility] for [facility]. Spoke to rep, Glenora Laos.   Contacted [employer] rep, Jermaine for RW with seat. Wife states they have access to RW.   Expected Discharge Plan: Home w Home Health Services Barriers to Discharge: Continued Medical Work up   Patient Goals and CMS Choice Patient states their goals for this hospitalization and ongoing recovery are:: wants to get better CMS Medicare.gov Compare Post Acute Care list provided to:: Patient Choice offered to / list presented to : Patient      Expected Discharge Plan and Services   Discharge Planning Services: CM Consult Post Acute Care Choice: Home Health Living arrangements for the past 2 months: Single Family Home                                      Prior Living Arrangements/Services Living arrangements for the past 2 months: Single Family Home Lives with:: Spouse Patient language and need for interpreter reviewed:: Yes Do you feel safe going back to the place where you live?: Yes      Need for Family Participation in Patient Care: Yes (Comment) Care giver support system in place?: Yes (comment)   Criminal Activity/Legal Involvement Pertinent to Current Situation/Hospitalization: No - Comment as needed  Activities of Daily Living   ADL Screening (condition at time of admission) Independently performs ADLs?: Yes (appropriate for developmental age) Is the patient deaf or have difficulty hearing?: No Does the patient have difficulty seeing, even when wearing  glasses/contacts?: No Does the patient have difficulty concentrating, remembering, or making decisions?: No  Permission Sought/Granted Permission sought to share information with : Case Manager, Family Supports, PCP Permission granted to share information with : Yes, Verbal Permission Granted  Share Information with NAME: Sherry Roell  Permission granted to share info w AGENCY: Home Health  Permission granted to share info w Relationship: wife  Permission granted to share info w Contact Information: [phone]  Emotional Assessment Appearance:: Appears stated age Attitude/Demeanor/Rapport: Engaged Affect (typically observed): Accepting Orientation: : Oriented to Self, Oriented to Place, Oriented to  Time, Oriented to Situation   Psych Involvement: No (comment)  Admission diagnosis:  Nonrheumatic aortic valve stenosis [I35.0] Aneurysm of ascending aorta without rupture (HCC) [I71.21] Severe aortic stenosis [I35.0] Patient Active Problem List   Diagnosis Date Noted   S/P aortic valve replacement with bioprosthetic valve 08/20/2023   Severe aortic stenosis 08/20/2023   LVH (left ventricular hypertrophy) 01/23/2023   Aortic regurgitation 12/10/2022   Aortic stenosis 12/10/2022   Nicotine abuse 12/10/2022   Aneurysm of ascending aorta without rupture (HCC) 12/10/2022   GERD (gastroesophageal reflux disease) 07/20/2019   PUD (peptic ulcer disease)    Constipation 10/20/2018   Rectal bleeding 10/20/2018   Rupture of left quadriceps tendon s/p repair 03/05/17    Helicobacter pylori gastritis 04/18/2015   ETOH abuse 04/18/2015  Liver lesion 04/18/2015   Essential hypertension, benign 11/04/2013   Heart murmur 11/04/2013   Back pain associated with peripheral numbness 08/10/2013   Upper abdominal pain 03/31/2013   Hematochezia 07/01/2012   PCP:  Wilburn Handler, MD Pharmacy:   [facility] [address] [place] [address] [place] [address] [place] [address] [place]  [address] Phone: [phone] Fax: [phone]  [facility] [address] [place] [address] Phone: [phone] Fax: [phone]     Social Drivers of Health (SDOH) Social History: SDOH Screenings   Food Insecurity: No Food Insecurity (08/20/2023)  Housing: Low Risk  (08/20/2023)  Transportation Needs: No Transportation Needs (08/20/2023)  Utilities: Not At Risk (08/20/2023)  Tobacco Use: High Risk (08/20/2023)   SDOH Interventions:     Readmission Risk Interventions     No data to display

## 2023-08-21 NOTE — Progress Notes (Signed)
 NAME:  Stephen Koch, MRN:  [id number], DOB:  Jul 27, 1960, LOS: 1 ADMISSION DATE:  08/20/2023, CONSULTATION DATE: 08/20/2023 REFERRING MD: Dr. Melene Sportsman, CHIEF COMPLAINT: Status post aortic valve replacement  History of Present Illness:  63 year old male with active tobacco dependence, hypertension, hyperlipidemia and BPH who started with dyspnea on exertion.  He was evaluated with echocardiogram which showed severe aortic stenosis with dilated ascending aorta 4.3 cm.  Also noted to have moderate pulmonary hypertension but no coronary artery disease, today he underwent tissue prosthetic aortic valve replacement.  Postprocedure patient was transferred to ICU, PCCM was consulted fibrillation medical management  Pertinent  Medical History   Past Medical History:  Diagnosis Date   Arthritis    Chronic back pain    Forklift injury   Essential hypertension, benign    GERD (gastroesophageal reflux disease)    Heart murmur    History of medication noncompliance      Significant Hospital Events: Including procedures, antibiotic start and stop dates in addition to other pertinent events   4/24 s/p AVR. Had cuff leak on arrival to ICU had ETT exchange but then later successfully extubated   Interim History / Subjective:  No distress.  Pain tolerable PRNs  Objective   Blood pressure 117/67, pulse 77, temperature 98.2 F (36.8 C), resp. rate 18, height 6\' 3"  (1.905 m), weight 95.2 kg, SpO2 94%. PAP: (23-38)/(6-25) 31/13 CO:  [2.8 L/min-6.2 L/min] 6.2 L/min CI:  [1.27 L/min/m2-2.77 L/min/m2] 2.77 L/min/m2  Vent Mode: PSV;CPAP FiO2 (%):  [40 %-50 %] 40 % Set Rate:  [4 bmp-16 bmp] 4 bmp Vt Set:  [409 mL] 670 mL PEEP:  [5 cmH20] 5 cmH20 Pressure Support:  [10 cmH20] 10 cmH20   Intake/Output Summary (Last 24 hours) at 08/21/2023 0802 Last data filed at 08/21/2023 0630 Gross per 24 hour  Intake 5750.92 ml  Output 3061 ml  Net 2689.92 ml   Filed Weights   08/20/23 0554 08/21/23 0500   Weight: 93 kg 95.2 kg    Examination:  General Pleasant 63 year old male patient patient lying in bed no acute distress HEENT normocephalic atraumatic no jugular venous distention is appreciated Pulmonary some scattered rhonchi no accessory use currently room air doing well with spirometry. Chest x-ray from today personally reviewed this shows some basilar atelectasis, cardiomegaly, postsurgical changes, PA catheter exchanged.  She is in place however this is since been removed Card RRR Abdomen soft nontender no organomegaly Extremities warm dry brisk cap refill Neuro awake oriented no focal deficits  Resolved Hospital Problem list     Assessment & Plan:  Severe aortic stenosis status post bioprosthetic aortic valve replacement Ascending aortic aneurysm 4.3 cm Plan Continue aspirin  and statin Continue prophylactic amiodarone  Cont tele  Glycemic control w/ ssi  Chest tube management TCTS (plan to keep again today) Continue pain control with tramadol , oxycodone  and morphine  IV lasix  today  Keeping TCPW for now   Chronic HFpEF Monitor intake and output EF 60 to 65% with grade 1 diastolic dysfunction plan GDMT once able to tolerate Cont metoprolol    Expected perioperative blood loss anemia H&H and plts stable post op Plan Am cbc  Hypertension Plan Getting lasix  today  Lopressor  resumed, currently off his amlodipine  but will watch BP   Hyperlipidemia plan Continue atorvastatin   Tobacco dependence Plan Smoking cessation counseling when appropriate He is wheezing a little bit, going to add some scheduled bronchodilators/ICS as needed short acting bronchodilator  Urinary retention  Plan Cont his flomax   Best Practice (right  click and "Reselect all SmartList Selections" daily)   Diet/type: Regular diet DVT prophylaxis: Enoxaparin  GI prophylaxis: PPI Lines: Removing PA catheter and A-line today keeping chest tube and transvenous pacer wire Code Status:  full  code Last date of multidisciplinary goals of care discussion [Per primary team]   My time 26 min

## 2023-08-21 NOTE — Progress Notes (Signed)
 [address]       [place] [address]             [phone]      1 Day Post-Op  Procedure(s) (LRB): REPLACEMENT, AORTIC VALVE, OPEN USING INSPIRIS RESILIA AORTIC VALVE (N/A) ECHOCARDIOGRAM, TRANSESOPHAGEAL (N/A)   Total Length of Stay:  LOS: 1 day    SUBJECTIVE: Did great overnight No issues  Vitals:   08/21/23 0601 08/21/23 0635  BP:    Pulse: 77 77  Resp: 11 18  Temp: 98.6 F (37 C) 98.2 F (36.8 C)  SpO2: 93% 94%    Intake/Output      04/24 0701 04/25 0700 04/25 0701 04/26 0700   I.V. (mL/kg) 2475.9 (26)    Blood 1607.8    IV Piggyback 1667.2    Total Intake(mL/kg) 5750.9 (60.4)    Urine (mL/kg/hr) 2271 (1)    Chest Tube 790    Total Output 3061    Net +2689.9             sodium chloride       ceFAZolin  (ANCEF ) IV Stopped (08/21/23 0552)   dexmedetomidine  (PRECEDEX ) IV infusion Stopped (08/20/23 1644)   epinephrine  Stopped (08/20/23 1639)   nitroGLYCERIN  Stopped (08/20/23 1537)   phenylephrine  (NEO-SYNEPHRINE) Adult infusion Stopped (08/20/23 1226)    CBC    Component Value Date/Time   WBC 10.2 08/21/2023 0401   RBC 3.80 (L) 08/21/2023 0401   HGB 11.1 (L) 08/21/2023 0401   HGB 14.8 06/11/2023 1317   HCT 34.0 (L) 08/21/2023 0401   HCT 43.9 06/11/2023 1317   PLT 166 08/21/2023 0401   PLT 261 06/11/2023 1317   MCV 89.5 08/21/2023 0401   MCV 91 06/11/2023 1317   MCH 29.2 08/21/2023 0401   MCHC 32.6 08/21/2023 0401   RDW 13.6 08/21/2023 0401   RDW 13.2 06/11/2023 1317   LYMPHSABS 2.4 03/04/2017 0912   MONOABS 1.1 (H) 03/04/2017 0912   EOSABS 0.1 03/04/2017 0912   BASOSABS 0.0 03/04/2017 0912   CMP     Component Value Date/Time   NA 135 08/21/2023 0401   NA 141 06/11/2023 1317   K 4.2 08/21/2023 0401   CL 106 08/21/2023 0401   CO2 24 08/21/2023 0401   GLUCOSE 104 (H) 08/21/2023 0401   BUN 9 08/21/2023 0401   BUN 15 06/11/2023 1317   CREATININE 0.75 08/21/2023 0401   CREATININE 0.86 04/24/2015 1247    CALCIUM  7.9 (L) 08/21/2023 0401   PROT 7.7 08/18/2023 1118   ALBUMIN  3.8 08/18/2023 1118   AST 19 08/18/2023 1118   ALT 12 08/18/2023 1118   ALKPHOS 69 08/18/2023 1118   BILITOT 1.0 08/18/2023 1118   GFRNONAA >60 08/21/2023 0401   GFRAA >60 03/04/2017 0912   ABG    Component Value Date/Time   PHART 7.352 08/20/2023 1838   PCO2ART 42.8 08/20/2023 1838   PO2ART 124 (H) 08/20/2023 1838   HCO3 23.7 08/20/2023 1838   TCO2 25 08/20/2023 1838   ACIDBASEDEF 2.0 08/20/2023 1838   O2SAT 99 08/20/2023 1838   CBG (last 3)  Recent Labs    08/20/23 2154 08/20/23 2358 08/21/23 0400  GLUCAP 116* 93 101*  EXAM Lungs; clear Card: rr no murmur Ext: warm Neuro:intact   ASSESSMENT: SP AVR Hemodynamics very good. DC swan and aline Pulm: on RA. Leave chest tubes. Diurese today Leave Cts and pws today OOB and ambulate   Melene Sportsman, MD 08/21/2023

## 2023-08-22 ENCOUNTER — Inpatient Hospital Stay (HOSPITAL_COMMUNITY)

## 2023-08-22 LAB — GLUCOSE, CAPILLARY
Glucose-Capillary: 114 mg/dL — ABNORMAL HIGH (ref 70–99)
Glucose-Capillary: 144 mg/dL — ABNORMAL HIGH (ref 70–99)
Glucose-Capillary: 105 mg/dL — ABNORMAL HIGH (ref 70–99)
Glucose-Capillary: 106 mg/dL — ABNORMAL HIGH (ref 70–99)
Glucose-Capillary: 110 mg/dL — ABNORMAL HIGH (ref 70–99)

## 2023-08-22 LAB — CBC
HCT: 33.2 % — ABNORMAL LOW (ref 39.0–52.0)
Hemoglobin: 10.9 g/dL — ABNORMAL LOW (ref 13.0–17.0)
MCH: 29.8 pg (ref 26.0–34.0)
MCHC: 32.8 g/dL (ref 30.0–36.0)
MCV: 90.7 fL (ref 80.0–100.0)
Platelets: 173 10*3/uL (ref 150–400)
RBC: 3.66 MIL/uL — ABNORMAL LOW (ref 4.22–5.81)
RDW: 13.7 % (ref 11.5–15.5)
WBC: 11.4 10*3/uL — ABNORMAL HIGH (ref 4.0–10.5)
nRBC: 0 % (ref 0.0–0.2)

## 2023-08-22 LAB — BASIC METABOLIC PANEL WITH GFR
Anion gap: 9 (ref 5–15)
BUN: 14 mg/dL (ref 8–23)
CO2: 27 mmol/L (ref 22–32)
Calcium: 8.2 mg/dL — ABNORMAL LOW (ref 8.9–10.3)
Chloride: 99 mmol/L (ref 98–111)
Creatinine, Ser: 0.86 mg/dL (ref 0.61–1.24)
GFR, Estimated: >60 mL/min (ref >60)
Glucose, Bld: 112 mg/dL — ABNORMAL HIGH (ref 70–99)
Potassium: 3.9 mmol/L (ref 3.5–5.1)
Sodium: 135 mmol/L (ref 135–145)

## 2023-08-22 MED ORDER — SODIUM CHLORIDE 0.9% FLUSH: 3.0000 mL | INTRAVENOUS | Status: DC | PRN

## 2023-08-22 MED ORDER — SODIUM CHLORIDE 0.9 % IV SOLN: 250.0000 mL | INTRAVENOUS | Status: AC | PRN

## 2023-08-22 MED ORDER — SODIUM CHLORIDE 0.9% FLUSH
3.0000 mL | Freq: Two times a day (BID) | INTRAVENOUS | Status: DC
  Administered 2023-08-22 – 2023-08-25 (×7): 3 mL via INTRAVENOUS

## 2023-08-22 MED ORDER — ~~LOC~~ CARDIAC SURGERY, PATIENT & FAMILY EDUCATION: Freq: Once | Status: AC

## 2023-08-22 NOTE — Plan of Care (Signed)

## 2023-08-22 NOTE — Progress Notes (Signed)
      [address]       [place] [address]             [phone]                 2 Days Post-Op Procedure(s) (LRB): REPLACEMENT, AORTIC VALVE, OPEN USING INSPIRIS RESILIA AORTIC VALVE (N/A) ECHOCARDIOGRAM, TRANSESOPHAGEAL (N/A)   Events: No events Doing well _______________________________________________________________ Vitals: BP 111/82 (BP Location: Right Arm)   Pulse 69   Temp 98.1 F (36.7 C) (Oral)   Resp (!) 22   Ht 6\' 3"  (1.905 m)   Wt 93.5 kg   SpO2 96%   BMI 25.76 kg/m  Filed Weights   08/20/23 0554 08/21/23 0500 08/22/23 0500  Weight: 93 kg 95.2 kg 93.5 kg     - Neuro: alert NAD  - Cardiovascular: sinus  Drips: none.      - Pulm: EWOB    ABG    Component Value Date/Time   PHART 7.352 08/20/2023 1838   PCO2ART 42.8 08/20/2023 1838   PO2ART 124 (H) 08/20/2023 1838   HCO3 23.7 08/20/2023 1838   TCO2 25 08/20/2023 1838   ACIDBASEDEF 2.0 08/20/2023 1838   O2SAT 99 08/20/2023 1838    - Abd: ND - Extremity: no edema  .Intake/Output      04/25 0701 04/26 0700 04/26 0701 04/27 0700   P.O. 960    I.V. (mL/kg) 10 (0.1)    Blood     IV Piggyback 98.8    Total Intake(mL/kg) 1068.8 (11.4)    Urine (mL/kg/hr) 2093 (0.9) 70 (0.3)   Chest Tube 140    Total Output 2233 70   Net -1164.2 -70           _______________________________________________________________ Labs:    Latest Ref Rng & Units 08/22/2023    5:00 AM 08/21/2023    4:41 PM 08/21/2023    4:01 AM  CBC  WBC 4.0 - 10.5 K/uL 11.4  11.6  10.2   Hemoglobin 13.0 - 17.0 g/dL 91.4  78.2  95.6   Hematocrit 39.0 - 52.0 % 33.2  36.2  34.0   Platelets 150 - 400 K/uL 173  177  166       Latest Ref Rng & Units 08/22/2023    5:00 AM 08/21/2023    4:41 PM 08/21/2023    4:01 AM  CMP  Glucose 70 - 99 mg/dL 213  086  578   BUN 8 - 23 mg/dL 14  13  9    Creatinine 0.61 - 1.24 mg/dL 4.69  6.29  5.28   Sodium 135 - 145 mmol/L 135  136  135   Potassium 3.5 - 5.1 mmol/L  3.9  4.2  4.2   Chloride 98 - 111 mmol/L 99  101  106   CO2 22 - 32 mmol/L 27  28  24    Calcium  8.9 - 10.3 mg/dL 8.2  8.4  7.9     CXR: Wide mediastinum, stable  _______________________________________________________________  Assessment and Plan: POD 2 s/p bAVR  Neuro: pain controlled CV: will remove wires.  On A/S/BB, and amio. Pulm: will remove Cts,  IS, ambulation Renal: creat stable GI: on diet Heme: stable ID: afebrile Endo: SSI Dispo: floor today   Stephen Koch 08/22/2023 9:32 AM

## 2023-08-22 NOTE — Plan of Care (Signed)

## 2023-08-22 NOTE — Progress Notes (Signed)
 To see patient for ambulation. Pt is on bedrest s/p d/c pf pacing wires.  C/R will follow.  Rosezena Contes MS, ACSM-CEP, CCRP

## 2023-08-23 LAB — CBC
HCT: 32.2 % — ABNORMAL LOW (ref 39.0–52.0)
Hemoglobin: 10.9 g/dL — ABNORMAL LOW (ref 13.0–17.0)
MCH: 29.5 pg (ref 26.0–34.0)
MCHC: 33.9 g/dL (ref 30.0–36.0)
MCV: 87.3 fL (ref 80.0–100.0)
Platelets: 214 10*3/uL (ref 150–400)
RBC: 3.69 MIL/uL — ABNORMAL LOW (ref 4.22–5.81)
RDW: 13.6 % (ref 11.5–15.5)
WBC: 12.2 10*3/uL — ABNORMAL HIGH (ref 4.0–10.5)
nRBC: 0 % (ref 0.0–0.2)

## 2023-08-23 LAB — GLUCOSE, CAPILLARY
Glucose-Capillary: 132 mg/dL — ABNORMAL HIGH (ref 70–99)
Glucose-Capillary: 97 mg/dL (ref 70–99)
Glucose-Capillary: 98 mg/dL (ref 70–99)

## 2023-08-23 LAB — BASIC METABOLIC PANEL WITH GFR
Anion gap: 10 (ref 5–15)
BUN: 14 mg/dL (ref 8–23)
CO2: 26 mmol/L (ref 22–32)
Calcium: 8.3 mg/dL — ABNORMAL LOW (ref 8.9–10.3)
Chloride: 98 mmol/L (ref 98–111)
Creatinine, Ser: 0.86 mg/dL (ref 0.61–1.24)
GFR, Estimated: >60 mL/min (ref >60)
Glucose, Bld: 136 mg/dL — ABNORMAL HIGH (ref 70–99)
Potassium: 3.8 mmol/L (ref 3.5–5.1)
Sodium: 134 mmol/L — ABNORMAL LOW (ref 135–145)

## 2023-08-23 NOTE — Anesthesia Postprocedure Evaluation (Signed)
 Anesthesia Post Note  Patient: Stephen Koch  Procedure(s) Performed: REPLACEMENT, AORTIC VALVE, OPEN USING INSPIRIS RESILIA AORTIC VALVE (Chest) ECHOCARDIOGRAM, TRANSESOPHAGEAL     Patient location during evaluation: ICU Anesthesia Type: General Level of consciousness: sedated Pain management: pain level controlled Vital Signs Assessment: post-procedure vital signs reviewed and stable Respiratory status: patient remains intubated per anesthesia plan Cardiovascular status: stable Postop Assessment: no apparent nausea or vomiting Anesthetic complications: no   No notable events documented.                  Eldar Robitaille

## 2023-08-23 NOTE — Plan of Care (Signed)
   Problem: Education: Goal: Knowledge of General Education information will improve Description: Including pain rating scale, medication(s)/side effects and non-pharmacologic comfort measures Outcome: Progressing   Problem: Clinical Measurements: Goal: Ability to maintain clinical measurements within normal limits will improve Outcome: Progressing Goal: Will remain free from infection Outcome: Progressing

## 2023-08-23 NOTE — Progress Notes (Addendum)
 3 Days Post-Op Procedure(s) (LRB): REPLACEMENT, AORTIC VALVE, OPEN USING INSPIRIS RESILIA AORTIC VALVE (N/A) ECHOCARDIOGRAM, TRANSESOPHAGEAL (N/A) Subjective: Feels pretty well overall, minor pain  Objective: Vital signs in last 24 hours: Temp:  [98 F (36.7 C)-99.5 F (37.5 C)] 98.2 F (36.8 C) (04/27 0816) Pulse Rate:  [64-80] 80 (04/27 0816) Cardiac Rhythm: Normal sinus rhythm (04/26 1901) Resp:  [13-53] 17 (04/27 0816) BP: (104-127)/(76-91) 117/76 (04/27 0816) SpO2:  [91 %-98 %] 95 % (04/27 0816) Weight:  [92.8 kg] 92.8 kg (04/27 0405)  Hemodynamic parameters for last 24 hours:    Intake/Output from previous day: 04/26 0701 - 04/27 0700 In: -  Out: 831 [Urine:821; Chest Tube:10] Intake/Output this shift: No intake/output data recorded.  General appearance: alert, cooperative, and no distress Heart: regular rate and rhythm and soft systolic murmur Lungs: clear Abdomen: mild distension, + BS, soft, non tender Extremities: no edema Wound: dressing in place  Lab Results: Recent Labs    08/21/23 1641 08/22/23 0500  WBC 11.6* 11.4*  HGB 11.7* 10.9*  HCT 36.2* 33.2*  PLT 177 173   BMET:  Recent Labs    08/21/23 1641 08/22/23 0500  NA 136 135  K 4.2 3.9  CL 101 99  CO2 28 27  GLUCOSE 116* 112*  BUN 13 14  CREATININE 1.01 0.86  CALCIUM  8.4* 8.2*    PT/INR:  Recent Labs    08/20/23 1203  LABPROT 19.5*  INR 1.6*   ABG    Component Value Date/Time   PHART 7.352 08/20/2023 1838   HCO3 23.7 08/20/2023 1838   TCO2 25 08/20/2023 1838   ACIDBASEDEF 2.0 08/20/2023 1838   O2SAT 99 08/20/2023 1838   CBG (last 3)  Recent Labs    08/22/23 2033 08/23/23 0023 08/23/23 0406  GLUCAP 110* 97 98    Meds Scheduled Meds:  acetaminophen   1,000 mg Oral Q6H   Or   acetaminophen  (TYLENOL ) oral liquid 160 mg/5 mL  1,000 mg Per Tube Q6H   amiodarone   400 mg Oral BID   aspirin  EC  325 mg Oral Daily   Or   aspirin   324 mg Per Tube Daily   atorvastatin    40 mg Oral Daily   bisacodyl   10 mg Oral Daily   Or   bisacodyl   10 mg Rectal Daily   Chlorhexidine  Gluconate Cloth  6 each Topical Daily   docusate sodium   200 mg Oral Daily   enoxaparin  (LOVENOX ) injection  40 mg Subcutaneous QHS   fluticasone  furoate-vilanterol  1 puff Inhalation Daily   gabapentin   300 mg Oral BID   insulin  aspart  0-24 Units Subcutaneous Q4H   methocarbamol   500 mg Oral BID   metoprolol  tartrate  12.5 mg Oral BID   Or   metoprolol  tartrate  12.5 mg Per Tube BID   multivitamin with minerals  1 tablet Oral Daily   pantoprazole   40 mg Oral Daily   sodium chloride  flush  3 mL Intravenous Q12H   sodium chloride  flush  3 mL Intravenous Q12H   sodium chloride  flush  3-10 mL Intravenous Q12H   tamsulosin   0.4 mg Oral Daily   Continuous Infusions:  sodium chloride      PRN Meds:.sodium chloride , albuterol , metoprolol  tartrate, morphine  injection, ondansetron  (ZOFRAN ) IV, mouth rinse, oxyCODONE , sodium chloride  flush, sodium chloride  flush, traMADol   Xrays DG Chest Port 1 View Result Date: 08/22/2023 CLINICAL DATA:  63 year old male postoperative day 2 aortic valve replacement. EXAM: PORTABLE CHEST 1 VIEW COMPARISON:  Portable chest 08/21/2023 and earlier. FINDINGS: Portable AP semi upright view at 0517 hours. Swan-Ganz catheter removed, left IJ introducer sheath remains. Chest/mediastinal tubes remain in the midline. Stable lung volumes. Stable cardiomegaly and mediastinal contours. No pneumothorax, pulmonary edema. Mild hypo ventilation at the right lung base now. Small pleural effusion is possible there. No consolidation. Stable visualized osseous structures. Visible bowel-gas within normal limits. IMPRESSION: 1. Swan-Ganz catheter removed. Right IJ introducer sheath and chest/mediastinal tubes remain. 2. Stable cardiomegaly with no pneumothorax or pulmonary edema identified. Possible small right pleural effusion. Electronically Signed   By: Marlise Simpers M.D.   On: 08/22/2023  08:17    Assessment/Plan: S/P Procedure(s) (LRB): REPLACEMENT, AORTIC VALVE, OPEN USING INSPIRIS RESILIA AORTIC VALVE (N/A) ECHOCARDIOGRAM, TRANSESOPHAGEAL (N/A) POD#3  1 Afeb VSS, sinus rhythm, BBB 2 sats good on RA 3 voiding not measured 4 weight at preop 5 BS well controlled- not a diabetic 6 no new labs or xrays in epic 7 repeat labs in am 8 cont pulm hygiene and rehab, poss home in am    LOS: 3 days    Lindi Revering PA-C Pager [phone] 08/23/2023   Agree with above Dispo planning  Bienvenido Proehl Ala [place]

## 2023-08-23 NOTE — Progress Notes (Signed)
 Mobility Specialist Progress Note:   08/23/23 1556  Mobility  Activity Ambulated with assistance in hallway  Level of Assistance Contact guard assist, steadying assist  Assistive Device Front wheel walker  Distance Ambulated (ft) 470 ft  RUE Weight Bearing Per Provider Order NWB  LUE Weight Bearing Per Provider Order NWB  Activity Response Tolerated well  Mobility Referral Yes  Mobility visit 1 Mobility  Mobility Specialist Start Time (ACUTE ONLY) 1515  Mobility Specialist Stop Time (ACUTE ONLY) 1527  Mobility Specialist Time Calculation (min) (ACUTE ONLY) 12 min   Pt received in bed, agreeable to mobility. Pt was able to stand within sternal p with CG. Requesting to use BR to urinate before ambulation. Void successful. Pt was able to ambulate entire hallway w/o fault. VSS. Pt left in chair with call bell in reach and all needs met.   Sofia Dunn  Mobility Specialist Please contact via [employer] office at [phone]

## 2023-08-24 ENCOUNTER — Other Ambulatory Visit: Payer: Self-pay | Admitting: Student

## 2023-08-24 DIAGNOSIS — Z952 Presence of prosthetic heart valve: Secondary | ICD-10-CM

## 2023-08-24 LAB — GLUCOSE, CAPILLARY
Glucose-Capillary: 110 mg/dL — ABNORMAL HIGH (ref 70–99)
Glucose-Capillary: 84 mg/dL (ref 70–99)

## 2023-08-24 MED ORDER — METOPROLOL TARTRATE 25 MG PO TABS
25.0000 mg | ORAL_TABLET | Freq: Two times a day (BID) | ORAL | Status: DC
  Administered 2023-08-24 – 2023-08-25 (×3): 25 mg via ORAL
  Filled 2023-08-24 (×3): qty 1

## 2023-08-24 MED ORDER — METOPROLOL TARTRATE 25 MG/10 ML ORAL SUSPENSION
12.5000 mg | Freq: Two times a day (BID) | ORAL | Status: DC
  Filled 2023-08-24 (×3): qty 5

## 2023-08-24 MED ORDER — POTASSIUM CHLORIDE CRYS ER 20 MEQ PO TBCR
40.0000 meq | EXTENDED_RELEASE_TABLET | Freq: Once | ORAL | Status: AC
  Administered 2023-08-24: 40 meq via ORAL
  Filled 2023-08-24: qty 2

## 2023-08-24 NOTE — Progress Notes (Signed)
 CARDIAC REHAB PHASE I   Pt has just returned from walk in hallway. Pt reports tolerating mobility well.  Post OHS education including site care, restrictions, heart healthy diet, sternal precautions, IS use at home, home needs at discharge, exercise guidelines and CRP2 reviewed. All questions and concerns addressed. Will refer to AP for CRP2. Will continue to follow.   [phone] Ronny Colas, RN BSN 08/24/2023 10:37 AM

## 2023-08-24 NOTE — Progress Notes (Addendum)
 4 Days Post-Op Procedure(s) (LRB): REPLACEMENT, AORTIC VALVE, OPEN USING INSPIRIS RESILIA AORTIC VALVE (N/A) ECHOCARDIOGRAM, TRANSESOPHAGEAL (N/A) Subjective: Feels ok , ambulation improving  Objective: Vital signs in last 24 hours: Temp:  [98.2 F (36.8 C)-99.2 F (37.3 C)] 99 F (37.2 C) (04/28 0443) Pulse Rate:  [77-84] 77 (04/28 0028) Cardiac Rhythm: Normal sinus rhythm (04/27 1900) Resp:  [17-20] 17 (04/28 0443) BP: (102-159)/(76-104) 129/99 (04/28 0443) SpO2:  [94 %-98 %] 98 % (04/28 0443) Weight:  [91.9 kg] 91.9 kg (04/28 0443)  Hemodynamic parameters for last 24 hours:    Intake/Output from previous day: 04/27 0701 - 04/28 0700 In: -  Out: 300 [Urine:300] Intake/Output this shift: No intake/output data recorded.  General appearance: alert, cooperative, and no distress Heart: irregularly irregular rhythm Lungs: mild coarseness throughout Abdomen: benign Extremities: no edema Wound: incis healing well  Lab Results: Recent Labs    08/22/23 0500 08/23/23 1501  WBC 11.4* 12.2*  HGB 10.9* 10.9*  HCT 33.2* 32.2*  PLT 173 214   BMET:  Recent Labs    08/22/23 0500 08/23/23 1501  NA 135 134*  K 3.9 3.8  CL 99 98  CO2 27 26  GLUCOSE 112* 136*  BUN 14 14  CREATININE 0.86 0.86  CALCIUM  8.2* 8.3*    PT/INR: No results for input(s): "LABPROT", "INR" in the last 72 hours. ABG    Component Value Date/Time   PHART 7.352 08/20/2023 1838   HCO3 23.7 08/20/2023 1838   TCO2 25 08/20/2023 1838   ACIDBASEDEF 2.0 08/20/2023 1838   O2SAT 99 08/20/2023 1838   CBG (last 3)  Recent Labs    08/23/23 2030 08/24/23 0031 08/24/23 0446  GLUCAP 132* 110* 84    Meds Scheduled Meds:  acetaminophen   1,000 mg Oral Q6H   Or   acetaminophen  (TYLENOL ) oral liquid 160 mg/5 mL  1,000 mg Per Tube Q6H   amiodarone   400 mg Oral BID   aspirin  EC  325 mg Oral Daily   Or   aspirin   324 mg Per Tube Daily   atorvastatin   40 mg Oral Daily   bisacodyl   10 mg Oral Daily    Or   bisacodyl   10 mg Rectal Daily   docusate sodium   200 mg Oral Daily   enoxaparin  (LOVENOX ) injection  40 mg Subcutaneous QHS   fluticasone  furoate-vilanterol  1 puff Inhalation Daily   gabapentin   300 mg Oral BID   methocarbamol   500 mg Oral BID   metoprolol  tartrate  12.5 mg Oral BID   Or   metoprolol  tartrate  12.5 mg Per Tube BID   multivitamin with minerals  1 tablet Oral Daily   pantoprazole   40 mg Oral Daily   sodium chloride  flush  3 mL Intravenous Q12H   sodium chloride  flush  3 mL Intravenous Q12H   sodium chloride  flush  3-10 mL Intravenous Q12H   tamsulosin   0.4 mg Oral Daily   Continuous Infusions: PRN Meds:.albuterol , metoprolol  tartrate, morphine  injection, ondansetron  (ZOFRAN ) IV, mouth rinse, oxyCODONE , sodium chloride  flush, sodium chloride  flush, traMADol   Xrays No results found.  Assessment/Plan: S/P Procedure(s) (LRB): REPLACEMENT, AORTIC VALVE, OPEN USING INSPIRIS RESILIA AORTIC VALVE (N/A) ECHOCARDIOGRAM, TRANSESOPHAGEAL (N/A) POD#4 1 afeb, S BP 100's-150's, mostly well controlled in good range, NSR, PAC's, PVC's, will increase beta blocker a little, cont amio 2 sats good on RA 3 below preop wt 4 no new labs, yesterdays ok- will give a little potassium to get >2.0 5 cont rehab  and pulm hygiene   LOS: 4 days    Wayne E Gold PA-C Pager [phone] 08/24/2023 Agree with above Home soon

## 2023-08-24 NOTE — Progress Notes (Signed)
 Mobility Specialist Progress Note:    08/24/23 0958  Mobility  Activity Ambulated with assistance in room;Ambulated with assistance to bathroom  Level of Assistance Standby assist, set-up cues, supervision of patient - no hands on  Assistive Device Front wheel walker  Distance Ambulated (ft) 470 ft  RUE Weight Bearing Per Provider Order NWB  LUE Weight Bearing Per Provider Order NWB  Activity Response Tolerated well  Mobility Referral Yes  Mobility visit 1 Mobility  Mobility Specialist Start Time (ACUTE ONLY) 0940  Mobility Specialist Stop Time (ACUTE ONLY) 0950  Mobility Specialist Time Calculation (min) (ACUTE ONLY) 10 min   Pt received in bed, agreeable to mobility session. Ambulated in room and hallway with RW. SBA for safety. Tolerated well, asx throughout. Returned pt to room, all needs met, call bell in reach.    Virgene Tirone Mobility Specialist Please contact via Special educational needs teacher or  Rehab office at [phone]

## 2023-08-24 NOTE — Progress Notes (Signed)
 Ordered per instructions from Allegra Arch, PA-C .

## 2023-08-24 NOTE — Progress Notes (Signed)
 Telemetry had called at ~ 0900 this morning to report PT had 10 beat run of V-tach (rhythm was during A.Fib RVR and did appear irregularly irregular like A. Fib vs V. Tach). PT would experience Paroxysmal A. Fib with RVR 110-150s converting back and for to Sinus 70-80s. Pt was oblivious to episodes and offered no complaints. I secure Chat messaged Zane Adams and then sent a text to Dr. Honey Lusty. PT was given AM meds to include scheduled Amiodarone  and metoprolol . Again, PT offered no complains and remained HD stable.

## 2023-08-24 NOTE — Care Management Important Message (Signed)
 Important Message  Patient Details  Name: Stephen Koch MRN: [id number] Date of Birth: 10/04/1960   Important Message Given:  Yes - Medicare IM     Janith Melnick 08/24/2023, 11:10 AM

## 2023-08-25 ENCOUNTER — Other Ambulatory Visit (HOSPITAL_COMMUNITY): Payer: Self-pay

## 2023-08-25 MED ORDER — AMIODARONE HCL 200 MG PO TABS
ORAL_TABLET | ORAL | 1 refills | Status: DC
  Filled 2023-08-25: qty 60, 30d supply, fill #0

## 2023-08-25 MED ORDER — ASPIRIN 325 MG PO TBEC
325.0000 mg | DELAYED_RELEASE_TABLET | Freq: Every day | ORAL | 0 refills | Status: DC
Start: 2023-08-25 — End: 2023-12-25
  Filled 2023-08-25: qty 100, 100d supply, fill #0

## 2023-08-25 MED ORDER — LACTULOSE 10 GM/15ML PO SOLN
20.0000 g | Freq: Once | ORAL | Status: AC
Start: 2023-08-25 — End: 2023-08-25
  Administered 2023-08-25: 20 g via ORAL
  Filled 2023-08-25: qty 30

## 2023-08-25 MED ORDER — ASPIRIN 325 MG PO TBEC: 325.0000 mg | DELAYED_RELEASE_TABLET | Freq: Every day | ORAL | Status: DC

## 2023-08-25 MED ORDER — METOPROLOL TARTRATE 25 MG PO TABS
25.0000 mg | ORAL_TABLET | Freq: Two times a day (BID) | ORAL | 1 refills | Status: DC
  Filled 2023-08-25: qty 60, 30d supply, fill #0

## 2023-08-25 MED ORDER — OXYCODONE HCL 5 MG PO TABS
5.0000 mg | ORAL_TABLET | Freq: Four times a day (QID) | ORAL | 0 refills | Status: DC | PRN
  Filled 2023-08-25: qty 30, 7d supply, fill #0

## 2023-08-25 NOTE — Progress Notes (Signed)
 Mobility Specialist Progress Note:    08/25/23 0907  Mobility  Activity Ambulated with assistance in room;Ambulated with assistance in hallway  Level of Assistance Standby assist, set-up cues, supervision of patient - no hands on  Assistive Device Four wheel walker  Distance Ambulated (ft) 470 ft  RUE Weight Bearing Per Provider Order NWB  LUE Weight Bearing Per Provider Order NWB  Activity Response Tolerated well  Mobility Referral Yes  Mobility visit 1 Mobility  Mobility Specialist Start Time (ACUTE ONLY) [id number]  Mobility Specialist Stop Time (ACUTE ONLY) 0906  Mobility Specialist Time Calculation (min) (ACUTE ONLY) 10 min   Pt received in bed, agreeable to mobility session. Ambulated in hallway with 4WW. Tolerated well, asx throughout. Returned pt to room, MD at bedside. Pt eager for d/c. Left with all needs met.   Audwin Semper Mobility Specialist Please contact via Special educational needs teacher or  Rehab office at [phone]

## 2023-08-25 NOTE — Progress Notes (Addendum)
      [address]       [employer] [address]             [phone]        5 Days Post-Op Procedure(s) (LRB): REPLACEMENT, AORTIC VALVE, OPEN USING INSPIRIS RESILIA AORTIC VALVE (N/A) ECHOCARDIOGRAM, TRANSESOPHAGEAL (N/A)  Subjective: Patient passing flatus but no bowel movement yet.  Objective: Vital signs in last 24 hours: Temp:  [98.1 F (36.7 C)-98.8 F (37.1 C)] 98.2 F (36.8 C) (04/29 0256) Pulse Rate:  [68-80] 74 (04/29 0256) Cardiac Rhythm: Normal sinus rhythm (04/28 1929) Resp:  [19-26] 19 (04/29 0256) BP: (104-134)/(69-86) 134/83 (04/29 0256) SpO2:  [92 %-100 %] 96 % (04/29 0256) Weight:  [91.2 kg] 91.2 kg (04/29 0256)  Pre op weight 93 kg Current Weight  08/25/23 91.2 kg      Intake/Output from previous day: 04/28 0701 - 04/29 0700 In: 243 [P.O.:240; I.V.:3] Out: -    Physical Exam:  Cardiovascular: RRR Pulmonary: Clear to auscultation bilaterally Abdomen: Soft, non tender, bowel sounds present. Extremities: No lower extremity edema. Wound: Clean and dry.  No erythema or signs of infection.  Lab Results: CBC: Recent Labs    08/23/23 1501  WBC 12.2*  HGB 10.9*  HCT 32.2*  PLT 214   BMET:  Recent Labs    08/23/23 1501  NA 134*  K 3.8  CL 98  CO2 26  GLUCOSE 136*  BUN 14  CREATININE 0.86  CALCIUM  8.3*    PT/INR:  Lab Results  Component Value Date   INR 1.6 (H) 08/20/2023   INR 1.1 08/18/2023   INR 1.08 11/11/2010   ABG:  INR: Will add last result for INR, ABG once components are confirmed Will add last 4 CBG results once components are confirmed  Assessment/Plan:  1. CV - He had a run on telemetry yesterday afternoon-a fib/VT. SR with HR in the 60's. On Amiodarone  400 mg bid, Lopressor  25 mg bid. Will continue Amiodarone  at discharge (will dose down). 2.  Pulmonary - On room air. On Breo Ellipta . Encourage incentive spirometer. 3.  Expected post op acute blood loss anemia - Last H and H stable at  10.9 and 32.2 4. CBGs 132/110/84. Pre op HGA1C 4.7. Will stop accu checks and SS PRN. 5. On Lovenox  for DVT prophylaxis 6. LOC constipation 7. Disposition-will discuss with Dr. Honey Lusty if consider discharge later today vs am.  Joanell Mowers ZimmermanPA-C 7:16 AM  Agree with above. DC later today

## 2023-08-25 NOTE — Progress Notes (Signed)
 CARDIAC REHAB PHASE I   Postop education completed. Referral for CRP2 sent to AP. Plan for discharge home today.  Ronny Colas, RN BSN 08/25/2023 12:13 PM

## 2023-08-25 NOTE — TOC Transition Note (Signed)
 Transition of Care (TOC) - Discharge Note Sherin Dingwall RN, BSN Transitions of Care Unit 4E- RN Case Manager See Treatment Team for direct phone #   Patient Details  Name: Stephen Koch MRN: [id number] Date of Birth: 1961-01-26  Transition of [facility]) CM/SW Contact:  Rox Cope, RN Phone Number: 08/25/2023, 12:40 PM   Clinical Narrative:    Pt stable for transition home today, CM confirmed rollator has been delivered per [employer].   Per morning progression rounds no HH needs noted. CM notified [facility] liaison who was following under [facility] office referral.   Pt will transport home w/ wife.   No further TOC needs noted.    Final next level of care: Home/Self Care Barriers to Discharge: Barriers Resolved   Patient Goals and CMS Choice Patient states their goals for this hospitalization and ongoing recovery are:: wants to get better CMS Medicare.gov Compare Post Acute Care list provided to:: Patient Choice offered to / list presented to : Patient      Discharge Placement               Home        Discharge Plan and Services Additional resources added to the After Visit Summary for     Discharge Planning Services: CM Consult Post Acute Care Choice: Home Health          DME Arranged: Walker rolling with seat DME Agency: [employer] Date DME Agency Contacted: 08/24/23 Time DME Agency Contacted: 1000 Representative spoke with at DME Agency: Zula Hitch HH Arranged: NA HH Agency: NA        Social Drivers of Health (SDOH) Interventions SDOH Screenings   Food Insecurity: No Food Insecurity (08/20/2023)  Housing: Low Risk  (08/20/2023)  Transportation Needs: No Transportation Needs (08/20/2023)  Utilities: Not At Risk (08/20/2023)  Social Connections: Socially Integrated (08/21/2023)  Tobacco Use: High Risk (08/20/2023)     Readmission Risk Interventions    08/25/2023   12:40 PM  Readmission Risk Prevention Plan  Post Dischage Appt  Complete  Medication Screening Complete  Transportation Screening Complete

## 2023-08-26 MED FILL — Electrolyte-R (PH 7.4) Solution: INTRAVENOUS | Qty: 5000 | Status: AC

## 2023-08-26 MED FILL — Heparin Sodium (Porcine) Inj 1000 Unit/ML: INTRAMUSCULAR | Qty: 10 | Status: AC

## 2023-08-26 MED FILL — Sodium Chloride IV Soln 0.9%: INTRAVENOUS | Qty: 3000 | Status: AC

## 2023-08-26 MED FILL — Mannitol IV Soln 20%: INTRAVENOUS | Qty: 500 | Status: AC

## 2023-09-16 ENCOUNTER — Ambulatory Visit: Attending: Physician Assistant

## 2023-09-16 ENCOUNTER — Encounter: Payer: Self-pay | Admitting: Medical

## 2023-09-16 ENCOUNTER — Ambulatory Visit: Attending: Medical | Admitting: Physician Assistant

## 2023-09-16 ENCOUNTER — Telehealth: Payer: Self-pay | Admitting: *Deleted

## 2023-09-16 VITALS — BP 116/76 | HR 59 | Ht 75.0 in | Wt 201.2 lb

## 2023-09-16 DIAGNOSIS — I5189 Other ill-defined heart diseases: Secondary | ICD-10-CM | POA: Diagnosis not present

## 2023-09-16 DIAGNOSIS — I6523 Occlusion and stenosis of bilateral carotid arteries: Secondary | ICD-10-CM | POA: Diagnosis not present

## 2023-09-16 DIAGNOSIS — I480 Paroxysmal atrial fibrillation: Secondary | ICD-10-CM | POA: Diagnosis not present

## 2023-09-16 DIAGNOSIS — I7121 Aneurysm of the ascending aorta, without rupture: Secondary | ICD-10-CM

## 2023-09-16 DIAGNOSIS — R002 Palpitations: Secondary | ICD-10-CM

## 2023-09-16 DIAGNOSIS — Z952 Presence of prosthetic heart valve: Secondary | ICD-10-CM

## 2023-09-16 DIAGNOSIS — I517 Cardiomegaly: Secondary | ICD-10-CM

## 2023-09-16 DIAGNOSIS — I351 Nonrheumatic aortic (valve) insufficiency: Secondary | ICD-10-CM | POA: Diagnosis not present

## 2023-09-16 DIAGNOSIS — I25118 Atherosclerotic heart disease of native coronary artery with other forms of angina pectoris: Secondary | ICD-10-CM

## 2023-09-16 DIAGNOSIS — E785 Hyperlipidemia, unspecified: Secondary | ICD-10-CM

## 2023-09-16 DIAGNOSIS — I10 Essential (primary) hypertension: Secondary | ICD-10-CM | POA: Diagnosis not present

## 2023-09-16 DIAGNOSIS — I472 Ventricular tachycardia, unspecified: Secondary | ICD-10-CM

## 2023-09-16 DIAGNOSIS — I350 Nonrheumatic aortic (valve) stenosis: Secondary | ICD-10-CM

## 2023-09-16 NOTE — Patient Instructions (Signed)
 Medication Instructions:   Take Medications Daily   Increase water  intake to 6-8 bottles daily   Decrease soda and coffee intake.   *If you need a refill on your cardiac medications before your next appointment, please call your pharmacy*  Lab Work: NONE   If you have labs (blood work) drawn today and your tests are completely normal, you will receive your results only by: MyChart Message (if you have MyChart) OR A paper copy in the mail If you have any lab test that is abnormal or we need to change your treatment, we will call you to review the results.  Testing/Procedures: NONE   Follow-Up: At [facility], you and your health needs are our priority.  As part of our continuing mission to provide you with exceptional heart care, our providers are all part of one team.  This team includes your primary Cardiologist (physician) and Advanced Practice Providers or APPs (Physician Assistants and Nurse Practitioners) who all work together to provide you with the care you need, when you need it.  Your next appointment:   2 -3 month(s)  Provider:   You may see Stephen P Mallipeddi, MD or one of the following Advanced Practice Providers on your designated Care Team:   [place], PA-C  Scotesia Pacolet, [place] Theotis Flake, [place]     We recommend signing up for the patient portal called "MyChart".  Sign up information is provided on this After Visit Summary.  MyChart is used to connect with patients for Virtual Visits (Telemedicine).  Patients are able to view lab/test results, encounter notes, upcoming appointments, etc.  Non-urgent messages can be sent to your provider as well.   To learn more about what you can do with MyChart, go to [email or web address].   Other Instructions Thank you for choosing [facility]!

## 2023-09-16 NOTE — Telephone Encounter (Signed)
-----   Message from Metta Actis sent at 09/16/2023  4:27 PM EDT ----- Regarding: 14 day monitor Please call patient to schedule for 14 day monitor. Dx: Palpitations.

## 2023-09-16 NOTE — Progress Notes (Signed)
 Cardiology Office Note:  .   Date:  09/16/2023  ID:  Charlette Console, DOB 1961-03-25, MRN [id number] PCP: Wilburn Handler, MD  [facility] Providers Cardiologist:  [name], MD {  History of Present Illness: Stephen Koch   Stephen Koch is a 63 y.o. 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   Last seen in consult 06/11/2023 with Dr. Robie Cho for aortic valve disease.  Noted previous negative workup for amyloid.  Echo in CMR showed significant aortic valve disease with AS and AR.  Reported worsening shortness of breath and fatigue over the past year.  Also noted occasional chest discomfort and tightness.  EKG reviewed from 04/2023 showed NSR with LVH.  Noted that patient was a candidate for either SAVR.  Cath on 05/2023 showed minimal nonobstructive CAD with CO of 11.4 and CI of 5.1 and  capacious iliofemoral vessels bilaterally.  Recommended to continue evaluation for aortic intervention.  PFT showed lung disease not severe.  CT coronaries 06/2023 showed dilated ascending aortic to 4.3 cm.  US  carotids 07/2023 minimal bilateral ICA stenosis 1-39%. 08/20/2023 SAVR. After procedure, noted A-fib/VT, was treated with amiodarone  and Lopressor  then converted back to NSR.  Continued on amiodarone .   On interview, he is accompanied by  his wife. Since procedure, reports CP, described as aching, 7/10, all over chest, constant. Notes 2 episodes of palpations, "fluttering" while watching TV, lasted less than 1 min. denies SOB, edema, dizziness, syncope. Reports poor appetite but endorses healthy food habitats. Soda 12oz x2, regular size mug of coffee x2, not drinking any water . Not complaint with medications daily. Notes  taking medications every other day due to being tired or sleeping. Not very active but has upcoming apt to get scheduled with therapy. Since procedure, report only 5 single cigarettes, 2 beers and marijuana use 1-2 x per week.    Studies Reviewed: Stephen Koch   EKG Interpretation Date/Time:  Wednesday Sep 16 2023 14:47:39 EDT Ventricular Rate:  59 PR Interval:  166 QRS Duration:  116 QT Interval:  410 QTC Calculation: 405 R Axis:   61  Text Interpretation: Sinus bradycardia Left ventricular hypertrophy ( Sokolow-Lyon )  Previously noted T wave inversions present When compared with ECG of 21-Aug-2023 06:54, Criteria for Inferior infarct are no longer Present Confirmed by Odetta Benes ([id number]) on 09/16/2023 2:51:55 PM   US  Carotids 07/2023 Summary:  Right Carotid: Velocities in the right ICA are consistent with a 1-39%  stenosis.  Left Carotid: Velocities in the left ICA are consistent with a 1-39%  stenosis.  Vertebrals: Bilateral vertebral arteries demonstrate antegrade flow.  Subclavians: Normal flow hemodynamics were seen in bilateral subclavian               arteries.   Cardiac CT/Chest CT 06/30/2023 IMPRESSION: Dilatation of the ascending aorta to 4.3 cm. No other focal abnormality is noted. IMPRESSION: 1. Annular measurements support a 29 mm S3 or 34 mm Evolut Pro. 2. No significant annular or subannular calcifications. 3. Sufficient coronary to annulus distance. 4. Optimal Fluoroscopic Angle for Delivery: RAO 0 CRA 7 5. Moderately dilated ascending aorta up to 43 mm. 6. Severe asymmetric hypertrophy of the basal septum up to 25 mm. 7. Dilated pulmonary artery consistent with pulmonary hypertension.  CATH 06/18/2023 1.  Minimal nonobstructive coronary artery disease of right dominant system.  The right coronary artery has an anterior takeoff. 2.  Fick cardiac output of 11.4 L/min and Fick cardiac index of 5.1 L/min/m with the following hemodynamics:            Right atrial  pressure mean of 11 mmHg            Right ventricular pressure 46/7 with an end-diastolic pressure of 16 mmHg            Wedge pressure mean of 20 mmHg with V waves to 22 mmHg            PA pressure 45/21 with a mean of 33 mmHg            PVR of 1.1 Woods units            PA pulsatility index of 2.2 3.  Capacious iliofemoral vessels bilaterally.   Recommendation: Continue evaluation for aortic valve intervention.  ECHO IMPRESSIONS 06/09/2023 1. Left ventricular ejection fraction, by estimation, is 60 to 65%. The left ventricle has normal function. The left ventricle has no regional wall motion abnormalities. There is severe asymmetric left ventricular hypertrophy of the septal segment. Left ventricular diastolic parameters are consistent with Grade I diastolic dysfunction (impaired relaxation). Elevated left ventricular end-diastolic pressure. 2. Right ventricular systolic function is normal. The right ventricular size is normal. Tricuspid regurgitation signal is inadequate for assessing PA pressure. 3. Left atrial size was moderately dilated. 4. Right atrial size was moderately dilated. 5. The mitral valve is normal in structure. Trivial mitral valve regurgitation. No evidence of mitral stenosis. 6. The aortic valve is tricuspid. There is mild restriction in the mobility of aortic valve leaflets. There is severe calcifcation of the aortic valve. Aortic valve regurgitation is mild to moderate, eccentric jet, visually but mild in severity based onPHT. Severe aortic valve stenosis based on velcoities but mild aortic valve stenosis based on aortic valve planimetry which is 1.8 cm2. Aortic regurgitation PHT measures 510 msec. Aortic valve area, by VTI measures 1.04 cm. Aortic valve mean gradient measures 45.0 mmHg. Aortic valve Vmax measures 4.52 m/s. DVI is 0.25. 7. Aortic dilatation noted. There is mild dilatation of the aortic root, measuring 41 mm. 8. The inferior vena cava is normal in size with  greater than 50% respiratory variability, suggesting right atrial pressure of 3 mmHg. 9. Increased flow velocities may be secondary to anemia, thyrotoxicosis, hyperdynamic or high flow state.   Comparison(s): No significant change from prior study.  CMR 05/05/2023 IMPRESSION: 1. Study not consistent with cardiac amyloidosis. There is concentric severe hypertrophy. This can be seen in uncontrolled hypertension, prolong aortic stenosis, and in hypertrophic non obstructive cardiomyopathy. No evidence of infiltrative disease 2.  At least moderate aortic stenosis with aortic regurgitation. 3.  Normal ventricular function 4.  Mild ascending aortic dilation.   MYOCARDIAL AMYLOID PLANAR AND SPECT 02/11/2023   Narrative   Myocardial uptake was positive for radiotracer uptake. The visual grade of myocardial uptake relative to the ribs was Grade 1 (Myocardial uptake less than rib uptake). The quantitative H/CL ratio was 1.23. H/CL Ratio is 1.23.   Findings are equivocal (Grade 1) of cardiac ATTR amyloidosis.   Prior study not available for comparison. Risk Assessment/Calculations:    CHA2DS2-VASc Score = 3  This indicates a 3.2% annual risk of stroke. The patient's score is based upon: CHF History: 0 HTN History: 1 Diabetes History: 0 Stroke History: 0  Vascular Disease History: 1 Age Score: 1 Gender Score: 0      Physical Exam:   VS:  BP 116/76 (BP Location: Left Arm, Patient Position: Sitting, Cuff Size: Normal)   Pulse (!) 59   Ht 6\' 3"  (1.905 m)   Wt 201 lb 3.2 oz (91.3 kg)   SpO2 97%   BMI 25.15 kg/m    Wt Readings from Last 3 Encounters:  09/16/23 201 lb 3.2 oz (91.3 kg)  08/25/23 201 lb 1.6 oz (91.2 kg)  08/18/23 203 lb (92.1 kg)    GEN: Well nourished, well developed in no acute distress NECK: No JVD; No carotid bruits CARDIAC: RRR, 2/6 systolic murmur in R  2nd ICS; no rubs, gallops RESPIRATORY:  Clear to auscultation without rales, wheezing or rhonchi  ABDOMEN: Soft,  non-tender, non-distended EXTREMITIES:  No edema; No deformity   ASSESSMENT AND PLAN: .   S/P Aortic Valve Replacement  Aortic stenosis Aortic regurgitation Aortic atherosclerosis Ascending aortic aneurysm CMR 05/05/2023: No cardiac amyloidosis. Moderate AS with AR. Mild ascending aortic dilation. Echo 06/09/2023: Mild to moderate AVR, severe AS, aortic root mild dilation 41 mm CT coronaries 06/2023 showed dilated ascending aortic to 43 mm 08/20/2023: SAVR Reports CP, described as aching, 7/10, all over chest, constant. Discussed this is expected with cardiac surgery. Less concern for ischemic cause with recent cath report, no ischemic changes on EKG, and presentation. Reinforced the importance of daily ASA 81 mg and atorvastatin  40 mg daily. Discussed benefits of daily medication and risk of noncompliance. Recommend getting help from wife, setting daily time schedule, or pill box. Patient states he will make proper changes on his own.  Encouraged to follow up with cardiac rehab Continue to follow aneurysm  Scheduled for follow up ECHO 10/05/2023  Post OP Afib/Vtach Palpitations  Echo 06/09/2023: Moderately dilated L/R atria After SAVR, noted A-fib/VT, was treated with amiodarone  and Lopressor  then converted back to NSR. EKG today with no afib or vtach.  Reports 2 episodes of palpitations "fluttering" since procedure, last less than 1 min. Drinks 2 cups of coffee and 2 soda daily without any water  intake.  Encouraged to reduce coffee and soda intake while increasing water  intake to 6-8 bottles of water  per day.   Only taking medication every other day. Reinforced the importance of daily amiodarone  200 mg and Lopressor  25 mg BID. Discussed benefits of daily medication and risk of noncompliance. Ordered 14 day monitor. Patient will have to come back in for placement.  Will plan to stop amio in about 3 months.   Diastolic dysfunction LVH Echo 1/61/0960: Severe asymmetrical LVH, G1DD, elevated  LVEDP Most likely due to aortic valve disease Follow up echo as above   Nonobstructive minimal CAD Cath 06/18/2023: minimal nonobstructive CAD with CO of 11.4 and CI of 5.1 and  capacious iliofemoral vessels bilaterally. ECHO 06/09/2023: EF 60 to 65%, Continue on ASA and Lipitor as above  Mild Bilateral Carotid Stenosis  US  carotids 07/2023 minimal bilateral ICA stenosis 1-39% Continue on Lipitor as above   HTN BP in office today: 116/76 Continue on Lopressor  as above  HLD Will check FLP at follow up  Continue on Lipitor as   Tobacco use  Since procedure, report only 5 single cigarettes Encouraged cessation    Cardiac Rehabilitation Eligibility Assessment  The patient is ready to start cardiac rehabilitation pending clearance from the cardiac surgeon.       Dispo: Medication compliance, decrease coffee/soda, increase water , Follow up in 2-3 months  Signed, Metta Actis, PA-C

## 2023-09-16 NOTE — Telephone Encounter (Signed)
 Called and notified pt of the need to wear a 14 day monitor. All questions answered if any.

## 2023-09-23 NOTE — Progress Notes (Signed)
 [address]       [place] Koch [id number]             [phone]       HPI: Stephen Koch is a 63 year old male with a past medical history of CAD, HTN, HLD, diastolic dysfunction, ascending thoracic aortic aneurysm, mild bilateral carotid stenosis, and tobacco abuse. The patient returns for routine postoperative follow-up having undergone aortic valve replacement utilizing a 25mm Inspiris Aortic valve and by Dr. Honey Lusty on 08/20/23. On TEE in the OR the ascending aorta measured 39-69mm and it was not felt that he required aorta replacement. The patient's early postoperative recovery while in the hospital was notable for postoperative PACs, PVCs and a run of VT for which he was continued on PO Amiodarone  and Lopressor  at discharge. He was discharged in stable condition on 08/25/23. He was seen by cardiology on 09/16/23 and reported to be noncompliant with medications taking them every other day, chest pain, a couple episodes of palpitations, and a poor appetite. He did report he had cut back on alcohol , cigarette and marijuana use since surgery. He presents in a wheelchair today. Since hospital discharge the patient reports chest soreness and fatigue.  He is taking lots of naps.  He has had minimal ambulation due to fear of doing too much.  He denies chest pain chest tightness, dizziness, loss of consciousness, lower extremity swelling.  He is no longer taking narcotic pain medication.  He reports that he is taking his medications daily but when asked about taking his blood pressure medication he reports he has not taken it yet today  Current Outpatient Medications  Medication Sig Dispense Refill   amiodarone  (PACERONE ) 200 MG tablet Take 200 mg by mouth two times daily for 10 days;then take 200 mg by mouth daily thereafter 60 tablet 1   aspirin  EC 325 MG tablet Take 1 tablet (325 mg total) by mouth daily. 100 tablet 0   atorvastatin  (LIPITOR) 40 MG tablet Take 40 mg by mouth  daily.     gabapentin  (NEURONTIN ) 300 MG capsule Take 300 mg by mouth 2 (two) times daily.      methocarbamol  (ROBAXIN ) 500 MG tablet Take 500 mg by mouth 2 (two) times daily.     metoprolol  tartrate (LOPRESSOR ) 25 MG tablet Take 1 tablet (25 mg total) by mouth 2 (two) times daily. 60 tablet 1   Multiple Vitamin (MULTIVITAMIN WITH MINERALS) TABS tablet Take 1 tablet by mouth daily.     naproxen (NAPROSYN) 500 MG tablet Take 500 mg by mouth 2 (two) times daily as needed for moderate pain (pain score 4-6).     omeprazole  (PRILOSEC) 20 MG capsule Take 1 capsule (20 mg total) by mouth daily. TAKE ONE CAPSULE BY MOUTH BEFORE BREAKFAST May take additional capsule before supper if needed 90 capsule 3   oxyCODONE  (OXY IR/ROXICODONE ) 5 MG immediate release tablet Take 1 tablet (5 mg total) by mouth every 6 (six) hours as needed for severe pain (pain score 7-10). (Patient not taking: Reported on 09/16/2023) 30 tablet 0   tamsulosin  (FLOMAX ) 0.4 MG CAPS capsule Take 0.4 mg by mouth daily.     No current facility-administered medications for this visit.   Vitals: Today's Vitals   09/29/23 1135  BP: (!) 157/98  Pulse: 61  Resp: 18  SpO2: 97%  Weight: 202 lb (91.6 kg)  Height: 6\' 3"  (1.905 m)   Body mass index is 25.25 kg/m.  Physical Exam: General: Alert and oriented, no acute distress Neuro: Grossly intact CV: Regular rate and rhythm, no murmur Pulm: Clear to auscultation bilaterally GI: No distension Extremities: No edema BLE Wound: Well healed without sign of infection  Diagnostic Tests: No sign of pleural effusion or infiltrate, no cardiopulmonary abnormality noted pending final read  Impression/Plan: S/P AVR: The patient is progressing slowly from surgery.  He has not been ambulating due to fear of "doing too much." He admits overall tiredness which should improve with time. He admits to some shortness of breath while walking due to lack of ambulation. Chest x-ray is clear without  signs of pleural effusion or infiltrate.  We discussed the importance of ambulating and he was agreeable to increase his activity level.  He has some chest soreness but no longer requires narcotic pain medication.  He denies chest pain and chest tightness.  He still has a lack of appetite we reviewed that this should improve with time.  His incisions have healed well without sign of infection.  We reviewed continued sternal precautions for 3 months from surgery.  I cleared him to drive once he is up to it and has ambulated more.  I cleared him for cardiac rehab and he is willing to participate.  He has a follow-up echocardiogram on 06/05.  We reviewed endocarditis prophylaxis.  I will not make any medication changes at this time.  Plan for the patient to return to the clinic as needed.  Medication Noncompliance: The patient states he is taking all of his medications as prescribed.  When asked if he took his blood pressure medication this morning he states he forgot and he will take it when he gets home.  We reviewed the importance of taking all medications as prescribed.  Palpitations and postop afib/vtach: Cardiology placed him on a 14 day monitor and plans to stop Amiodarone  in 3 months. Also recommended reducing caffeine intake and medication compliance. Continue to follow up with cardiology.  HTN: BP elevated in the clinic today but patient did not take his blood pressure medication. We reviewed importance of taking his medication as prescribed. Continue current BP regimen and follow up with cardiology.   Randa Burton, PA-C [facility] [phone]

## 2023-09-24 ENCOUNTER — Ambulatory Visit

## 2023-09-28 LAB — ECHO INTRAOPERATIVE TEE
AR max vel: 1.61 cm2
AV Area VTI: 1.27 cm2
AV Area mean vel: 1.52 cm2
AV Mean grad: 25.3 mmHg
AV Peak grad: 44.4 mmHg
AV Vena cont: 0.5 cm
Ao pk vel: 3.33 m/s
Height: 75 in
MV VTI: 2.32 cm2
P 1/2 time: 600 ms
Weight: 3280 [oz_av]

## 2023-09-29 ENCOUNTER — Ambulatory Visit: Attending: Thoracic Surgery (Cardiothoracic Vascular Surgery) | Admitting: Physician Assistant

## 2023-09-29 ENCOUNTER — Other Ambulatory Visit: Payer: Self-pay | Admitting: Thoracic Surgery (Cardiothoracic Vascular Surgery)

## 2023-09-29 ENCOUNTER — Encounter: Payer: Self-pay | Admitting: Physician Assistant

## 2023-09-29 ENCOUNTER — Ambulatory Visit (HOSPITAL_COMMUNITY)
Admission: RE | Admit: 2023-09-29 | Discharge: 2023-09-29 | Disposition: A | Discharge status: Home / Self Care | Source: Ambulatory Visit | Attending: Cardiovascular Disease | Admitting: Cardiovascular Disease

## 2023-09-29 VITALS — BP 157/98 | HR 61 | Resp 18 | Ht 75.0 in | Wt 202.0 lb

## 2023-09-29 DIAGNOSIS — Z953 Presence of xenogenic heart valve: Secondary | ICD-10-CM

## 2023-09-29 DIAGNOSIS — Z48812 Encounter for surgical aftercare following surgery on the circulatory system: Secondary | ICD-10-CM | POA: Diagnosis not present

## 2023-09-29 DIAGNOSIS — I350 Nonrheumatic aortic (valve) stenosis: Secondary | ICD-10-CM

## 2023-09-29 NOTE — Patient Instructions (Signed)
 Continue to avoid any heavy lifting or strenuous use of your arms or shoulders for at least a total of three months from the time of surgery.  After three months you may gradually increase how much you lift or otherwise use your arms or chest as tolerated, with limits based upon whether or not activities lead to the return of significant discomfort.  You are encouraged to enroll and participate in the outpatient cardiac rehab program beginning as soon as practical.  You may return to driving an automobile as long as you are no longer requiring oral narcotic pain relievers during the daytime.  It would be wise to start driving only short distances during the daylight and gradually increase from there as you feel comfortable.  Endocarditis is a potentially serious infection of heart valves or inside lining of the heart.  It occurs more commonly in patients with diseased heart valves (such as patient's with aortic or mitral valve disease) and in patients who have undergone heart valve repair or replacement.  Certain surgical and dental procedures may put you at risk, such as dental cleaning, other dental procedures, or any surgery involving the respiratory, urinary, gastrointestinal tract, gallbladder or prostate gland.   To minimize your chances for develooping endocarditis, maintain good oral health and seek prompt medical attention for any infections involving the mouth, teeth, gums, skin or urinary tract.    Always notify your doctor or dentist about your underlying heart valve condition before having any invasive procedures. You will need to take antibiotics before certain procedures, including all routine dental cleanings or other dental procedures.  Your cardiologist or dentist should prescribe these antibiotics for you to be taken ahead of time.

## 2023-10-01 ENCOUNTER — Ambulatory Visit (HOSPITAL_COMMUNITY)
Admission: RE | Admit: 2023-10-01 | Discharge: 2023-10-01 | Disposition: A | Discharge status: Home / Self Care | Source: Ambulatory Visit | Attending: Family Medicine | Admitting: Family Medicine

## 2023-10-01 DIAGNOSIS — Z952 Presence of prosthetic heart valve: Secondary | ICD-10-CM | POA: Diagnosis not present

## 2023-10-01 LAB — ECHOCARDIOGRAM COMPLETE
AR max vel: 1.29 cm2
AV Area VTI: 1.43 cm2
AV Area mean vel: 1.42 cm2
AV Mean grad: 12 mmHg
AV Peak grad: 26.8 mmHg
Ao pk vel: 2.59 m/s
Area-P 1/2: 2.62 cm2
S' Lateral: 2.6 cm

## 2023-10-01 NOTE — Progress Notes (Signed)
*  PRELIMINARY RESULTS* Echocardiogram 2D Echocardiogram has been performed.  [name] 10/01/2023, 11:31 AM

## 2023-10-05 ENCOUNTER — Ambulatory Visit: Payer: Self-pay | Admitting: Student

## 2023-10-05 ENCOUNTER — Encounter (HOSPITAL_COMMUNITY)

## 2023-10-06 ENCOUNTER — Telehealth (HOSPITAL_COMMUNITY): Payer: Self-pay

## 2023-10-06 ENCOUNTER — Encounter (HOSPITAL_COMMUNITY)

## 2023-10-07 ENCOUNTER — Encounter: Payer: Self-pay | Admitting: Gastroenterology

## 2023-10-07 ENCOUNTER — Encounter (HOSPITAL_COMMUNITY): Admission: RE | Admit: 2023-10-07 | Source: Ambulatory Visit

## 2023-10-07 ENCOUNTER — Ambulatory Visit (INDEPENDENT_AMBULATORY_CARE_PROVIDER_SITE_OTHER): Admitting: Gastroenterology

## 2023-10-07 VITALS — BP 147/89 | HR 57 | Temp 97.9°F | Ht 75.0 in | Wt 200.4 lb

## 2023-10-07 DIAGNOSIS — K5900 Constipation, unspecified: Secondary | ICD-10-CM

## 2023-10-07 DIAGNOSIS — D6489 Other specified anemias: Secondary | ICD-10-CM | POA: Diagnosis not present

## 2023-10-07 DIAGNOSIS — K625 Hemorrhage of anus and rectum: Secondary | ICD-10-CM | POA: Diagnosis not present

## 2023-10-07 DIAGNOSIS — K921 Melena: Secondary | ICD-10-CM

## 2023-10-07 DIAGNOSIS — K5904 Chronic idiopathic constipation: Secondary | ICD-10-CM

## 2023-10-07 DIAGNOSIS — K219 Gastro-esophageal reflux disease without esophagitis: Secondary | ICD-10-CM

## 2023-10-07 MED ORDER — TRULANCE 3 MG PO TABS: 3.0000 mg | ORAL_TABLET | Freq: Every day | ORAL | 11 refills | Status: AC

## 2023-10-07 MED ORDER — HYDROCORTISONE (PERIANAL) 2.5 % EX CREA
1.0000 | TOPICAL_CREAM | Freq: Two times a day (BID) | CUTANEOUS | 0 refills | Status: DC
Start: 2023-10-07 — End: 2023-12-25

## 2023-10-07 NOTE — Progress Notes (Signed)
 GI Office Note    Referring Provider: Wilburn Handler, MD Primary Care Physician:  Stephen Handler, MD  Primary Gastroenterologist: Stephen Koch. Stephen April, DO   Chief Complaint   Chief Complaint  Patient presents with   Constipation    Having trouble with hemorrhoids due to constipation that has been an ongoing problem since having open heart surgery end of last month. States that he has not had a bm in a week.     History of Present Illness   Stephen Koch is a 63 y.o. male presenting today for follow up. Last seen 12/2022.  H/o chronic GERD, constipation.   Completed aortic valve replacement back in 07/2023.  He had some postop anemia. Hgb at discharge was 10.9. Has follow up with PCP next week. States he always does labs. Recommended having Hgb rechecked.   Having issues with constipation, worse since heart valve surgery. Having BM once per week. Has to take four Exlax to get relief. Few weeks ago, passed hard stool and saw some brbpr. Seemed like a lot. Few small clots. No bleeding since. Denies rectal pain. Has some abdominal discomfort from constipation. Appetite ok. Eating lighter since his surgery. Eats limited fruits/veggies. Knows he needs more. Cardiology wants him drinking six bottles of water  daily but he needs to drink a lot more to reach this goal. Reflux is well controlled. No n/v. No dysphagia.   EGD 12/2018 with mild gastritis, bx neg for H.pylori. Colonoscopy 12/2018 with internal hemorrhoids s/p banding X 3.   Medications   Current Outpatient Medications  Medication Sig Dispense Refill   amiodarone  (PACERONE ) 200 MG tablet Take 200 mg by mouth two times daily for 10 days;then take 200 mg by mouth daily thereafter 60 tablet 1   amLODipine  (NORVASC ) 10 MG tablet Take 10 mg by mouth daily.     aspirin  EC 325 MG tablet Take 1 tablet (325 mg total) by mouth daily. 100 tablet 0   atorvastatin  (LIPITOR) 40 MG tablet Take 40 mg by mouth daily.     gabapentin  (NEURONTIN )  300 MG capsule Take 300 mg by mouth 2 (two) times daily.      methocarbamol  (ROBAXIN ) 500 MG tablet Take 500 mg by mouth 2 (two) times daily.     metoprolol  tartrate (LOPRESSOR ) 25 MG tablet Take 1 tablet (25 mg total) by mouth 2 (two) times daily. 60 tablet 1   Multiple Vitamin (MULTIVITAMIN WITH MINERALS) TABS tablet Take 1 tablet by mouth daily.     naproxen (NAPROSYN) 500 MG tablet Take 500 mg by mouth 2 (two) times daily as needed for moderate pain (pain score 4-6).     omeprazole  (PRILOSEC) 20 MG capsule Take 1 capsule (20 mg total) by mouth daily. TAKE ONE CAPSULE BY MOUTH BEFORE BREAKFAST May take additional capsule before supper if needed 90 capsule 3   tamsulosin  (FLOMAX ) 0.4 MG CAPS capsule Take 0.4 mg by mouth daily.     No current facility-administered medications for this visit.    Allergies   Allergies as of 10/07/2023 - Review Complete 10/07/2023  Allergen Reaction Noted   Ibuprofen  Other (See Comments) 04/06/2015       Review of Systems   General: Negative for anorexia, weight loss, fever, chills, fatigue, weakness. ENT: Negative for hoarseness, difficulty swallowing , nasal congestion. CV: Negative for chest pain, angina, palpitations, dyspnea on exertion, peripheral edema.  Respiratory: Negative for dyspnea at rest, dyspnea on exertion, cough, sputum, wheezing.  GI: See history of  present illness. GU:  Negative for dysuria, hematuria, urinary incontinence, urinary frequency, nocturnal urination.  Endo: Negative for unusual weight change.     Physical Exam   BP (!) 147/89 (BP Location: Right Arm, Patient Position: Sitting, Cuff Size: Normal)   Pulse (!) 57   Temp 97.9 F (36.6 C) (Oral)   Ht 6' 3 (1.905 m)   Wt 200 lb 6.4 oz (90.9 kg)   SpO2 96%   BMI 25.05 kg/m    General: Well-nourished, well-developed in no acute distress.  Eyes: No icterus. Mouth: Oropharyngeal mucosa moist and pink   Lungs: Clear to auscultation bilaterally.  Heart: Regular rate  and rhythm, no murmurs rubs or gallops.  Abdomen: Bowel sounds are normal, nontender, nondistended, no hepatosplenomegaly or masses,  no abdominal bruits or hernia , no rebound or guarding.  Rectal: not performed Extremities: No lower extremity edema. No clubbing or deformities. Neuro: Alert and oriented x 4   Skin: Warm and dry, no jaundice.   Psych: Alert and cooperative, normal mood and affect.  Labs   Lab Results  Component Value Date   NA 134 (L) 08/23/2023   CL 98 08/23/2023   K 3.8 08/23/2023   CO2 26 08/23/2023   BUN 14 08/23/2023   CREATININE 0.86 08/23/2023   GFRNONAA >60 08/23/2023   CALCIUM  8.3 (L) 08/23/2023   ALBUMIN  3.8 08/18/2023   GLUCOSE 136 (H) 08/23/2023   Lab Results  Component Value Date   ALT 12 08/18/2023   AST 19 08/18/2023   ALKPHOS 69 08/18/2023   BILITOT 1.0 08/18/2023   Lab Results  Component Value Date   WBC 12.2 (H) 08/23/2023   HGB 10.9 (L) 08/23/2023   HCT 32.2 (L) 08/23/2023   MCV 87.3 08/23/2023   PLT 214 08/23/2023    Imaging Studies   ECHOCARDIOGRAM COMPLETE Result Date: 10/01/2023    ECHOCARDIOGRAM REPORT   Patient Name:   Stephen Koch Date of Exam: 10/01/2023 Medical Rec #:  [id number]        Height:       75.0 in Accession #:    [id number]       Weight:       202.0 lb Date of Birth:  October 04, 1960        BSA:          2.204 m Patient Age:    63 years         BP:           169/98 mmHg Patient Gender: M                HR:           45 bpm. Exam Location:  [facility] Procedure: 2D Echo, Cardiac Doppler and Color Doppler (Both Spectral and Color            Flow Doppler were utilized during procedure). Indications:    S/P Aortic Valve replacement Z95.2  History:        Patient has prior history of Echocardiogram examinations, most                 recent 06/09/2023. Risk Factors:Hypertension and Smoking Hx. Hx                 of AVR.  Sonographer:    Stephen Koch RCS Referring Phys: [id number] Stephen Koch IMPRESSIONS  1. Left ventricular ejection  fraction, by estimation, is 60 to 65%. The left ventricle has normal function. The left ventricle has  no regional wall motion abnormalities. There is severe left ventricular hypertrophy. Left ventricular diastolic parameters  are indeterminate.  2. Right ventricular systolic function is low normal. The right ventricular size is normal. Tricuspid regurgitation signal is inadequate for assessing PA pressure.  3. Left atrial size was moderately dilated.  4. Right atrial size was mildly dilated.  5. The mitral valve is normal in structure. No evidence of mitral valve regurgitation. No evidence of mitral stenosis.  6. 25mm Inspiris Pericardial valve is in the aortic valve position. . The aortic valve has been repaired/replaced. Aortic valve regurgitation is not visualized. No aortic stenosis is present.  7. Aortic dilatation noted. There is moderate dilatation of the ascending aorta, measuring 45 mm.  8. The inferior vena cava is normal in size with greater than 50% respiratory variability, suggesting right atrial pressure of 3 mmHg. FINDINGS  Left Ventricle: Left ventricular ejection fraction, by estimation, is 60 to 65%. The left ventricle has normal function. The left ventricle has no regional wall motion abnormalities. The left ventricular internal cavity size was normal in size. There is  severe left ventricular hypertrophy. Left ventricular diastolic parameters are indeterminate. Right Ventricle: The right ventricular size is normal. Right vetricular wall thickness was not well visualized. Right ventricular systolic function is low normal. Tricuspid regurgitation signal is inadequate for assessing PA pressure. Left Atrium: Left atrial size was moderately dilated. Right Atrium: Right atrial size was mildly dilated. Pericardium: There is no evidence of pericardial effusion. Mitral Valve: The mitral valve is normal in structure. No evidence of mitral valve regurgitation. No evidence of mitral valve stenosis. Tricuspid  Valve: The tricuspid valve is normal in structure. Tricuspid valve regurgitation is trivial. No evidence of tricuspid stenosis. Aortic Valve: 25mm Inspiris Pericardial valve is in the aortic valve position. The aortic valve has been repaired/replaced. Aortic valve regurgitation is not visualized. No aortic stenosis is present. Aortic valve mean gradient measures 12.0 mmHg. Aortic  valve peak gradient measures 26.8 mmHg. Aortic valve area, by VTI measures 1.43 cm. Pulmonic Valve: The pulmonic valve was not well visualized. Pulmonic valve regurgitation is not visualized. No evidence of pulmonic stenosis. Aorta: The aortic root is normal in size and structure and aortic dilatation noted. There is moderate dilatation of the ascending aorta, measuring 45 mm. Venous: The inferior vena cava is normal in size with greater than 50% respiratory variability, suggesting right atrial pressure of 3 mmHg. IAS/Shunts: No atrial level shunt detected by color flow Doppler.  LEFT VENTRICLE PLAX 2D LVIDd:         4.80 cm   Diastology LVIDs:         2.60 cm   LV e' medial:    3.08 cm/s LV PW:         1.70 cm   LV E/e' medial:  31.3 LV IVS:        1.80 cm   LV e' lateral:   10.60 cm/s LVOT diam:     2.00 cm   LV E/e' lateral: 9.1 LV SV:         68 LV SV Index:   31 LVOT Area:     3.14 cm  RIGHT VENTRICLE RV S prime:     6.73 cm/s TAPSE (M-mode): 1.3 cm LEFT ATRIUM            Index        RIGHT ATRIUM           Index LA diam:  4.30 cm  1.95 cm/m   RA Area:     24.20 cm LA Vol (A2C): 104.0 ml 47.19 ml/m  RA Volume:   85.10 ml  38.62 ml/m LA Vol (A4C): 104.0 ml 47.19 ml/m  AORTIC VALVE AV Area (Vmax):    1.29 cm AV Area (Vmean):   1.42 cm AV Area (VTI):     1.43 cm AV Vmax:           259.00 cm/s AV Vmean:          148.000 cm/s AV VTI:            0.479 m AV Peak Grad:      26.8 mmHg AV Mean Grad:      12.0 mmHg LVOT Vmax:         106.00 cm/s LVOT Vmean:        66.900 cm/s LVOT VTI:          0.218 m LVOT/AV VTI ratio: 0.46   AORTA Ao Root diam: 3.90 cm Ao Asc diam:  4.50 cm MITRAL VALVE MV Area (PHT): 2.62 cm    SHUNTS MV Decel Time: 289 msec    Systemic VTI:  0.22 m MV E velocity: 96.40 cm/s  Systemic Diam: 2.00 cm MV A velocity: 84.80 cm/s MV E/A ratio:  1.14 Armida Lander MD Electronically signed by Armida Lander MD Signature Date/Time: 10/01/2023/12:05:27 PM    Final    DG Chest 2 View Result Date: 09/29/2023 CLINICAL DATA:  Status post aortic valve replacement with shortness of breath EXAM: CHEST - 2 VIEW COMPARISON:  Chest radiograph dated 08/22/2023 FINDINGS: Normal lung volumes. No focal consolidations. No pleural effusion or pneumothorax. Mildly enlarged cardiomediastinal silhouette status post aortic valve replacement. Median sternotomy wires are nondisplaced. Rounded radiopaque device projects over the left anterior chest wall. IMPRESSION: 1. No active cardiopulmonary disease. 2. Mildly enlarged cardiomediastinal silhouette status post aortic valve replacement. Electronically Signed   By: Limin  Xu M.D.   On: 09/29/2023 13:49    Assessment/Plan:   GERD: doing well -continue omeprazole  20mg  once to twice daily, lowest effective dose.  Constipation: poorly controlled -increase dietary fiber -increase water  to 6 bottles daily -Trulance 3mg  daily. He will call if too strong or not effective -return ov in two months  Rectal bleeding: likely due to benign anorectal source in setting of constipation -last colonoscopy 2020, hemorrhoid banding at that time -anusol  anorectally bid prn -manage constipation -monitor for additional rectal bleeding, if ongoing, would advise colonoscopy -return ov in two months  Anemia: post-op -needs to have rechecked. He has appt with PCP next week and anticipates labs. If not done, he will let me know       Trudie Fuse. Harles Lied, MHS, PA-C [facility]

## 2023-10-07 NOTE — Patient Instructions (Signed)
 For constipation:  -start Trulance 3mg  daily. Prescription sent to your pharmacy. Please let me know if not covered, or too expensive.  -if you have more than 4 stools in a day, skip Trulance the following day. -if not effective for management of your constipation or it seems too strong, please let me know. -return to the office in 2 months  For rectal bleeding: likely due to hemorrhoids -anusol  cream apply anorectally twice daily as needed for pain/bleeding -if you continue to have bleeding after we manage your constipation, please let me know as we may have to consider colonoscopy -return to the office in 2 months  For GERD: -continue omeprazole  20mg  once to twice daily  For anemia: -you had post-op anemia after recent heart surgery -request your PCP to recheck your labs next week, since he may be checking additional labs anyway

## 2023-10-08 ENCOUNTER — Encounter (HOSPITAL_COMMUNITY)
Admission: RE | Admit: 2023-10-08 | Discharge: 2023-10-08 | Disposition: A | Discharge status: Home / Self Care | Source: Ambulatory Visit | Attending: Internal Medicine | Admitting: Internal Medicine

## 2023-10-08 VITALS — Ht 74.0 in | Wt 184.7 lb

## 2023-10-08 DIAGNOSIS — Z953 Presence of xenogenic heart valve: Secondary | ICD-10-CM | POA: Diagnosis not present

## 2023-10-08 NOTE — Patient Instructions (Signed)
 Patient Instructions  Patient Details  Name: Stephen Koch MRN: [id number] Date of Birth: 1961/04/04 Referring Provider:  Thukkani, Arun K, MD  Below are your personal goals for exercise, nutrition, and risk factors. Our goal is to help you stay on track towards obtaining and maintaining these goals. We will be discussing your progress on these goals with you throughout the program.  Initial Exercise Prescription:  Initial Exercise Prescription - 10/08/23 1500       Date of Initial Exercise RX and Referring Provider   Date 10/08/23    Referring Provider Alyssa Backbone MD      Treadmill   MPH 0.8    Grade 0    Minutes 15    METs 1.7      NuStep   Level 3    SPM 60    Minutes 15    METs 2      Prescription Details   Frequency (times per week) 3    Duration Progress to 30 minutes of continuous aerobic without signs/symptoms of physical distress      Intensity   THRR 40-80% of Max Heartrate 93-134    Ratings of Perceived Exertion 11-13    Perceived Dyspnea 0-4      Resistance Training   Training Prescription Yes    Weight 4    Reps 10-15          Exercise Goals: Frequency: Be able to perform aerobic exercise two to three times per week in program working toward 2-5 days per week of home exercise.  Intensity: Work with a perceived exertion of 11 (fairly light) - 15 (hard) while following your exercise prescription.  We will make changes to your prescription with you as you progress through the program.   Duration: Be able to do 30 to 45 minutes of continuous aerobic exercise in addition to a 5 minute warm-up and a 5 minute cool-down routine.   Nutrition Goals: Your personal nutrition goals will be established when you do your nutrition analysis with the dietician.  The following are general nutrition guidelines to follow: Cholesterol < 200mg /day Sodium < 1500mg /day Fiber: Men over 50 yrs - 30 grams per day  Personal Goals:  Personal Goals and Risk Factors  at Admission - 10/08/23 1527       Core Components/Risk Factors/Patient Goals on Admission    Weight Management Weight Maintenance    Improve shortness of breath with ADL's Yes    Intervention Provide education, individualized exercise plan and daily activity instruction to help decrease symptoms of SOB with activities of daily living.    Expected Outcomes Short Term: Improve cardiorespiratory fitness to achieve a reduction of symptoms when performing ADLs;Long Term: Be able to perform more ADLs without symptoms or delay the onset of symptoms    Heart Failure Yes    Intervention Provide a combined exercise and nutrition program that is supplemented with education, support and counseling about heart failure. Directed toward relieving symptoms such as shortness of breath, decreased exercise tolerance, and extremity edema.    Expected Outcomes Improve functional capacity of life;Short term: Attendance in program 2-3 days a week with increased exercise capacity. Reported lower sodium intake. Reported increased fruit and vegetable intake. Reports medication compliance.;Short term: Daily weights obtained and reported for increase. Utilizing diuretic protocols set by physician.;Long term: Adoption of self-care skills and reduction of barriers for early signs and symptoms recognition and intervention leading to self-care maintenance.    Hypertension Yes    Intervention Provide education  on lifestyle modifcations including regular physical activity/exercise, weight management, moderate sodium restriction and increased consumption of fresh fruit, vegetables, and low fat dairy, alcohol  moderation, and smoking cessation.;Monitor prescription use compliance.    Expected Outcomes Short Term: Continued assessment and intervention until BP is < 140/59mm HG in hypertensive participants. < 130/106mm HG in hypertensive participants with diabetes, heart failure or chronic kidney disease.;Long Term: Maintenance of blood  pressure at goal levels.    Lipids Yes    Intervention Provide education and support for participant on nutrition & aerobic/resistive exercise along with prescribed medications to achieve LDL 70mg , HDL >40mg .    Expected Outcomes Short Term: Participant states understanding of desired cholesterol values and is compliant with medications prescribed. Participant is following exercise prescription and nutrition guidelines.;Long Term: Cholesterol controlled with medications as prescribed, with individualized exercise RX and with personalized nutrition plan. Value goals: LDL < 70mg , HDL > 40 mg.          Tobacco Use Initial Evaluation: Social History   Tobacco Use  Smoking Status Every Day   Types: Cigars  Smokeless Tobacco Never  Tobacco Comments   smokes about 1 black and milds per day     Exercise Goals and Review:  Exercise Goals     Row Name 10/08/23 1519             Exercise Goals   Increase Physical Activity Yes       Intervention Provide advice, education, support and counseling about physical activity/exercise needs.;Develop an individualized exercise prescription for aerobic and resistive training based on initial evaluation findings, risk stratification, comorbidities and participant's personal goals.       Expected Outcomes Short Term: Attend rehab on a regular basis to increase amount of physical activity.;Long Term: Add in home exercise to make exercise part of routine and to increase amount of physical activity.;Long Term: Exercising regularly at least 3-5 days a week.       Increase Strength and Stamina Yes       Intervention Provide advice, education, support and counseling about physical activity/exercise needs.;Develop an individualized exercise prescription for aerobic and resistive training based on initial evaluation findings, risk stratification, comorbidities and participant's personal goals.       Expected Outcomes Short Term: Perform resistance training  exercises routinely during rehab and add in resistance training at home;Short Term: Increase workloads from initial exercise prescription for resistance, speed, and METs.;Long Term: Improve cardiorespiratory fitness, muscular endurance and strength as measured by increased METs and functional capacity ( )       Able to understand and use rate of perceived exertion (RPE) scale Yes       Intervention Provide education and explanation on how to use RPE scale       Expected Outcomes Short Term: Able to use RPE daily in rehab to express subjective intensity level;Long Term:  Able to use RPE to guide intensity level when exercising independently       Able to understand and use Dyspnea scale Yes       Intervention Provide education and explanation on how to use Dyspnea scale       Expected Outcomes Short Term: Able to use Dyspnea scale daily in rehab to express subjective sense of shortness of breath during exertion;Long Term: Able to use Dyspnea scale to guide intensity level when exercising independently       Knowledge and understanding of Target Heart Rate Range (THRR) Yes       Intervention Provide education and explanation  of THRR including how the numbers were predicted and where they are located for reference       Expected Outcomes Short Term: Able to state/look up THRR;Long Term: Able to use THRR to govern intensity when exercising independently;Short Term: Able to use daily as guideline for intensity in rehab       Able to check pulse independently Yes       Intervention Provide education and demonstration on how to check pulse in carotid and radial arteries.;Review the importance of being able to check your own pulse for safety during independent exercise       Expected Outcomes Short Term: Able to explain why pulse checking is important during independent exercise;Long Term: Able to check pulse independently and accurately       Understanding of Exercise Prescription Yes       Intervention  Provide education, explanation, and written materials on patient's individual exercise prescription       Expected Outcomes Short Term: Able to explain program exercise prescription;Long Term: Able to explain home exercise prescription to exercise independently          Copy of goals given to participant.

## 2023-10-08 NOTE — Progress Notes (Signed)
 Cardiac Individual Treatment Plan  Patient Details  Name: Stephen Koch MRN: [id number] Date of Birth: [birth year] Referring Provider:   Flowsheet Row CARDIAC REHAB PHASE II ORIENTATION from 10/08/2023 in [place] [facility]  Referring Provider Alyssa Backbone MD    Initial Encounter Date:  Flowsheet Row CARDIAC REHAB PHASE II ORIENTATION from 10/08/2023 in [place] [facility]  Date 10/08/23    Visit Diagnosis: S/P aortic valve replacement with bioprosthetic valve  Patient's Home Medications on Admission:  Current Outpatient Medications:    amiodarone  (PACERONE ) 200 MG tablet, Take 200 mg by mouth two times daily for 10 days;then take 200 mg by mouth daily thereafter, Disp: 60 tablet, Rfl: 1   amLODipine  (NORVASC ) 10 MG tablet, Take 10 mg by mouth daily., Disp: , Rfl:    aspirin  EC 325 MG tablet, Take 1 tablet (325 mg total) by mouth daily., Disp: 100 tablet, Rfl: 0   atorvastatin  (LIPITOR) 40 MG tablet, Take 40 mg by mouth daily., Disp: , Rfl:    gabapentin  (NEURONTIN ) 300 MG capsule, Take 300 mg by mouth 2 (two) times daily. , Disp: , Rfl:    hydrocortisone  (ANUSOL -HC) 2.5 % rectal cream, Place 1 Application rectally 2 (two) times daily. As needed, Disp: 30 g, Rfl: 0   methocarbamol  (ROBAXIN ) 500 MG tablet, Take 500 mg by mouth 2 (two) times daily., Disp: , Rfl:    metoprolol  tartrate (LOPRESSOR ) 25 MG tablet, Take 1 tablet (25 mg total) by mouth 2 (two) times daily., Disp: 60 tablet, Rfl: 1   Multiple Vitamin (MULTIVITAMIN WITH MINERALS) TABS tablet, Take 1 tablet by mouth daily., Disp: , Rfl:    naproxen (NAPROSYN) 500 MG tablet, Take 500 mg by mouth 2 (two) times daily as needed for moderate pain (pain score 4-6)., Disp: , Rfl:    omeprazole  (PRILOSEC) 20 MG capsule, Take 1 capsule (20 mg total) by mouth daily. TAKE ONE CAPSULE BY MOUTH BEFORE BREAKFAST May take additional capsule before supper if needed, Disp: 90 capsule, Rfl: 3   Plecanatide (TRULANCE)  3 MG TABS, Take 1 tablet (3 mg total) by mouth daily., Disp: 30 tablet, Rfl: 11   tamsulosin  (FLOMAX ) 0.4 MG CAPS capsule, Take 0.4 mg by mouth daily., Disp: , Rfl:   Past Medical History: Past Medical History:  Diagnosis Date   Arthritis    Chronic back pain    Forklift injury   Essential hypertension, benign    GERD (gastroesophageal reflux disease)    Heart murmur    History of medication noncompliance     Tobacco Use: Social History   Tobacco Use  Smoking Status Every Day   Types: Cigars  Smokeless Tobacco Never  Tobacco Comments   smokes about 1 black and milds per day     Labs: Review Flowsheet  More data may exist      Latest Ref Rng & Units 11/01/2009 11/11/2010 06/18/2023 08/18/2023 08/20/2023  Labs for ITP Cardiac and Pulmonary Rehab  Hemoglobin A1c 4.8 - 5.6 % - - - 4.7  -  PH, Arterial 7.35 - 7.45 - - 7.381  - 7.352  7.354  7.336  7.326  7.343  7.374   PCO2 arterial 32 - 48 mmHg - - 43.4  - 42.8  40.3  39.9  45.8  44.9  46.1   Bicarbonate 20.0 - 28.0 mmol/L - - 26.3  27.8  25.7  - 23.7  22.4  21.6  23.9  27.0  24.4  26.9   TCO2 22 -  32 mmol/L 27  25  28  29  27   - 25  24  23  25  25  25  26  29  26  27  26  28    Acid-base deficit 0.0 - 2.0 mmol/L - - - - 2.0  3.0  4.0  2.0  1.0   O2 Saturation % - - 65  78  91  - 99  98  96  100  89  100  100     Details       Multiple values from one day are sorted in reverse-chronological order         Capillary Blood Glucose: Lab Results  Component Value Date   GLUCAP 84 08/24/2023   GLUCAP 110 (H) 08/24/2023   GLUCAP 132 (H) 08/23/2023   GLUCAP 98 08/23/2023   GLUCAP 97 08/23/2023     Exercise Target Goals: Exercise Program Goal: Individual exercise prescription set using results from initial 6 min walk test and THRR while considering  patient's activity barriers and safety.   Exercise Prescription Goal: Starting with aerobic activity 30 plus minutes a day, 3 days per week for initial exercise prescription.  Provide home exercise prescription and guidelines that participant acknowledges understanding prior to discharge.  Activity Barriers & Risk Stratification:  Activity Barriers & Cardiac Risk Stratification - 10/08/23 1352       Activity Barriers & Cardiac Risk Stratification   Activity Barriers Shortness of Breath;Assistive Device;Balance Concerns;Arthritis   OA in hands.   Cardiac Risk Stratification Moderate          6 Minute Walk:  6 Minute Walk     Row Name 10/08/23 1515         6 Minute Walk   Phase Initial     Distance 900 feet     Walk Time 6 minutes     # of Rest Breaks 0     MPH 1.7     METS 2.62     RPE 13     Perceived Dyspnea  1     VO2 Peak 9.16     Symptoms No     Resting HR 51 bpm     Resting BP 150/100     Resting Oxygen Saturation  97 %     Exercise Oxygen Saturation  during 6 min walk 97 %     Max Ex. HR 63 bpm     Max Ex. BP 158/90     2 Minute Post BP 148/90        Oxygen Initial Assessment:   Oxygen Re-Evaluation:   Oxygen Discharge (Final Oxygen Re-Evaluation):   Initial Exercise Prescription:  Initial Exercise Prescription - 10/08/23 1500       Date of Initial Exercise RX and Referring Provider   Date 10/08/23    Referring Provider Alyssa Backbone MD      Treadmill   MPH 0.8    Grade 0    Minutes 15    METs 1.7      NuStep   Level 3    SPM 60    Minutes 15    METs 2      Prescription Details   Frequency (times per week) 3    Duration Progress to 30 minutes of continuous aerobic without signs/symptoms of physical distress      Intensity   THRR 40-80% of Max Heartrate 93-134    Ratings of Perceived Exertion 11-13    Perceived Dyspnea 0-4  Resistance Training   Training Prescription Yes    Weight 4    Reps 10-15          Perform Capillary Blood Glucose checks as needed.  Exercise Prescription Changes:   Exercise Prescription Changes     Row Name 10/08/23 1500             Response to Exercise    Blood Pressure (Admit) 150/100       Blood Pressure (Exercise) 158/90       Blood Pressure (Exit) 148/90       Heart Rate (Admit) 51 bpm       Heart Rate (Exercise) 63 bpm       Heart Rate (Exit) 54 bpm       Oxygen Saturation (Admit) 97 %       Oxygen Saturation (Exercise) 97 %       Oxygen Saturation (Exit) 97 %       Rating of Perceived Exertion (Exercise) 13       Perceived Dyspnea (Exercise) 1          Exercise Comments:   Exercise Goals and Review:   Exercise Goals     Row Name 10/08/23 1519             Exercise Goals   Increase Physical Activity Yes       Intervention Provide advice, education, support and counseling about physical activity/exercise needs.;Develop an individualized exercise prescription for aerobic and resistive training based on initial evaluation findings, risk stratification, comorbidities and participant's personal goals.       Expected Outcomes Short Term: Attend rehab on a regular basis to increase amount of physical activity.;Long Term: Add in home exercise to make exercise part of routine and to increase amount of physical activity.;Long Term: Exercising regularly at least 3-5 days a week.       Increase Strength and Stamina Yes       Intervention Provide advice, education, support and counseling about physical activity/exercise needs.;Develop an individualized exercise prescription for aerobic and resistive training based on initial evaluation findings, risk stratification, comorbidities and participant's personal goals.       Expected Outcomes Short Term: Perform resistance training exercises routinely during rehab and add in resistance training at home;Short Term: Increase workloads from initial exercise prescription for resistance, speed, and METs.;Long Term: Improve cardiorespiratory fitness, muscular endurance and strength as measured by increased METs and functional capacity ( )       Able to understand and use rate of perceived exertion  (RPE) scale Yes       Intervention Provide education and explanation on how to use RPE scale       Expected Outcomes Short Term: Able to use RPE daily in rehab to express subjective intensity level;Long Term:  Able to use RPE to guide intensity level when exercising independently       Able to understand and use Dyspnea scale Yes       Intervention Provide education and explanation on how to use Dyspnea scale       Expected Outcomes Short Term: Able to use Dyspnea scale daily in rehab to express subjective sense of shortness of breath during exertion;Long Term: Able to use Dyspnea scale to guide intensity level when exercising independently       Knowledge and understanding of Target Heart Rate Range (THRR) Yes       Intervention Provide education and explanation of THRR including how the numbers were predicted and where they are  located for reference       Expected Outcomes Short Term: Able to state/look up THRR;Long Term: Able to use THRR to govern intensity when exercising independently;Short Term: Able to use daily as guideline for intensity in rehab       Able to check pulse independently Yes       Intervention Provide education and demonstration on how to check pulse in carotid and radial arteries.;Review the importance of being able to check your own pulse for safety during independent exercise       Expected Outcomes Short Term: Able to explain why pulse checking is important during independent exercise;Long Term: Able to check pulse independently and accurately       Understanding of Exercise Prescription Yes       Intervention Provide education, explanation, and written materials on patient's individual exercise prescription       Expected Outcomes Short Term: Able to explain program exercise prescription;Long Term: Able to explain home exercise prescription to exercise independently          Exercise Goals Re-Evaluation :    Discharge Exercise Prescription (Final Exercise  Prescription Changes):  Exercise Prescription Changes - 10/08/23 1500       Response to Exercise   Blood Pressure (Admit) 150/100    Blood Pressure (Exercise) 158/90    Blood Pressure (Exit) 148/90    Heart Rate (Admit) 51 bpm    Heart Rate (Exercise) 63 bpm    Heart Rate (Exit) 54 bpm    Oxygen Saturation (Admit) 97 %    Oxygen Saturation (Exercise) 97 %    Oxygen Saturation (Exit) 97 %    Rating of Perceived Exertion (Exercise) 13    Perceived Dyspnea (Exercise) 1          Nutrition:  Target Goals: Understanding of nutrition guidelines, daily intake of sodium 1500mg , cholesterol 200mg , calories 30% from fat and 7% or less from saturated fats, daily to have 5 or more servings of fruits and vegetables.  Biometrics:  Pre Biometrics - 10/08/23 1520       Pre Biometrics   Height 6' 2 (1.88 m)    Weight 83.8 kg    Waist Circumference 39 inches    Hip Circumference 40 inches    Waist to Hip Ratio 0.98 %    BMI (Calculated) 23.71    Grip Strength 40.7 kg    Single Leg Stand 2.32 seconds           Nutrition Therapy Plan and Nutrition Goals:   Nutrition Assessments:  MEDIFICTS Score Key: >=70 Need to make dietary changes  40-70 Heart Healthy Diet <= 40 Therapeutic Level Cholesterol Diet  Flowsheet Row CARDIAC REHAB PHASE II ORIENTATION from 10/08/2023 in [facility]  Picture Your Plate Total Score on Admission 35   Picture Your Plate Scores: <16 Unhealthy dietary pattern with much room for improvement. 41-50 Dietary pattern unlikely to meet recommendations for good health and room for improvement. 51-60 More healthful dietary pattern, with some room for improvement.  >60 Healthy dietary pattern, although there may be some specific behaviors that could be improved.    Nutrition Goals Re-Evaluation:   Nutrition Goals Discharge (Final Nutrition Goals Re-Evaluation):   Psychosocial: Target Goals: Acknowledge presence or absence of  significant depression and/or stress, maximize coping skills, provide positive support system. Participant is able to verbalize types and ability to use techniques and skills needed for reducing stress and depression.  Initial Review & Psychosocial Screening:  Initial Psych  Review & Screening - 10/08/23 1531       Initial Review   Current issues with Current Sleep Concerns      Family Dynamics   Good Support System? Yes      Barriers   Psychosocial barriers to participate in program The patient should benefit from training in stress management and relaxation.;There are no identifiable barriers or psychosocial needs.;Psychosocial barriers identified (see note)      Screening Interventions   Interventions Encouraged to exercise;To provide support and resources with identified psychosocial needs;Provide feedback about the scores to participant    Expected Outcomes Short Term goal: Utilizing psychosocial counselor, staff and physician to assist with identification of specific Stressors or current issues interfering with healing process. Setting desired goal for each stressor or current issue identified.;Long Term Goal: Stressors or current issues are controlled or eliminated.;Short Term goal: Identification and review with participant of any Quality of Life or Depression concerns found by scoring the questionnaire.;Long Term goal: The participant improves quality of Life and PHQ9 Scores as seen by post scores and/or verbalization of changes          Quality of Life Scores:  Quality of Life - 10/08/23 1521       Quality of Life   Select Quality of Life      Quality of Life Scores   Health/Function Pre 15.5 %    Socioeconomic Pre 28.8 %    Psych/Spiritual Pre 20 %    Family Pre 26.4 %    GLOBAL Pre 20.6 %         Scores of 19 and below usually indicate a poorer quality of life in these areas.  A difference of  2-3 points is a clinically meaningful difference.  A difference of 2-3  points in the total score of the Quality of Life Index has been associated with significant improvement in overall quality of life, self-image, physical symptoms, and general health in studies assessing change in quality of life.  PHQ-9: Review Flowsheet       10/08/2023  Depression screen PHQ 2/9  Decreased Interest 0  Down, Depressed, Hopeless 0  PHQ - 2 Score 0  Altered sleeping 3  Tired, decreased energy 1  Change in appetite 3  Feeling bad or failure about yourself  0  Trouble concentrating 0  Moving slowly or fidgety/restless 3  Suicidal thoughts 0  PHQ-9 Score 10  Difficult doing work/chores Very difficult   Interpretation of Total Score  Total Score Depression Severity:  1-4 = Minimal depression, 5-9 = Mild depression, 10-14 = Moderate depression, 15-19 = Moderately severe depression, 20-27 = Severe depression   Psychosocial Evaluation and Intervention:  Psychosocial Evaluation - 10/08/23 1531       Psychosocial Evaluation & Interventions   Interventions Stress management education;Relaxation education;Encouraged to exercise with the program and follow exercise prescription    Comments Patient was referred to CR with AV replacement. He denies any depression, anxiety or stressors. He lives along but is married. His wife is currently living with her sister as her caregiver. His PHQ-9 score was 10 due to sleeping too much during the day; overeating and having little energy. He says he has great support from his wife, mother, and 2 brothers. He does have trouble staying alseep at night but he thinks he sleeps too much during the day. He hopes this will improve when he feels stronger.  He seems very motivated to participate in the program and was very active prior to  his surgery. He want to improve his strength, stamina, and energy; get back to doing all the activities he was doing prior to his surgery and to quit smoking. He has no barriers identified to participate in the program.     Expected Outcomes Short Term: Patient will start the program and attend consistently. Long Term: Patient will complete the program meeting personal goals.    Continue Psychosocial Services  Follow up required by staff          Psychosocial Re-Evaluation:   Psychosocial Discharge (Final Psychosocial Re-Evaluation):   Vocational Rehabilitation: Provide vocational rehab assistance to qualifying candidates.   Vocational Rehab Evaluation & Intervention:  Vocational Rehab - 10/08/23 1527       Initial Vocational Rehab Evaluation & Intervention   Assessment shows need for Vocational Rehabilitation No      Vocational Rehab Re-Evaulation   Comments Patient is disabled.          Education: Education Goals: Education classes will be provided on a weekly basis, covering required topics. Participant will state understanding/return demonstration of topics presented.  Learning Barriers/Preferences:  Learning Barriers/Preferences - 10/08/23 1404       Learning Barriers/Preferences   Learning Barriers None    Learning Preferences Audio;Written Material;Skilled Demonstration          Education Topics: Hypertension, Hypertension Reduction -Define heart disease and high blood pressure. Discus how high blood pressure affects the body and ways to reduce high blood pressure.   Exercise and Your Heart -Discuss why it is important to exercise, the FITT principles of exercise, normal and abnormal responses to exercise, and how to exercise safely.   Angina -Discuss definition of angina, causes of angina, treatment of angina, and how to decrease risk of having angina.   Cardiac Medications -Review what the following cardiac medications are used for, how they affect the body, and side effects that may occur when taking the medications.  Medications include Aspirin , Beta blockers, calcium  channel blockers, ACE Inhibitors, angiotensin receptor blockers, diuretics, digoxin, and  antihyperlipidemics.   Congestive Heart Failure -Discuss the definition of CHF, how to live with CHF, the signs and symptoms of CHF, and how keep track of weight and sodium intake.   Heart Disease and Intimacy -Discus the effect sexual activity has on the heart, how changes occur during intimacy as we age, and safety during sexual activity.   Smoking Cessation / COPD -Discuss different methods to quit smoking, the health benefits of quitting smoking, and the definition of COPD.   Nutrition I: Fats -Discuss the types of cholesterol, what cholesterol does to the heart, and how cholesterol levels can be controlled.   Nutrition II: Labels -Discuss the different components of food labels and how to read food label   Heart Parts/Heart Disease and PAD -Discuss the anatomy of the heart, the pathway of blood circulation through the heart, and these are affected by heart disease.   Stress I: Signs and Symptoms -Discuss the causes of stress, how stress may lead to anxiety and depression, and ways to limit stress.   Stress II: Relaxation -Discuss different types of relaxation techniques to limit stress.   Warning Signs of Stroke / TIA -Discuss definition of a stroke, what the signs and symptoms are of a stroke, and how to identify when someone is having stroke.   Knowledge Questionnaire Score:  Knowledge Questionnaire Score - 10/08/23 1527       Knowledge Questionnaire Score   Pre Score 18/28  Core Components/Risk Factors/Patient Goals at Admission:  Personal Goals and Risk Factors at Admission - 10/08/23 1527       Core Components/Risk Factors/Patient Goals on Admission    Weight Management Weight Maintenance    Improve shortness of breath with ADL's Yes    Intervention Provide education, individualized exercise plan and daily activity instruction to help decrease symptoms of SOB with activities of daily living.    Expected Outcomes Short Term: Improve  cardiorespiratory fitness to achieve a reduction of symptoms when performing ADLs;Long Term: Be able to perform more ADLs without symptoms or delay the onset of symptoms    Heart Failure Yes    Intervention Provide a combined exercise and nutrition program that is supplemented with education, support and counseling about heart failure. Directed toward relieving symptoms such as shortness of breath, decreased exercise tolerance, and extremity edema.    Expected Outcomes Improve functional capacity of life;Short term: Attendance in program 2-3 days a week with increased exercise capacity. Reported lower sodium intake. Reported increased fruit and vegetable intake. Reports medication compliance.;Short term: Daily weights obtained and reported for increase. Utilizing diuretic protocols set by physician.;Long term: Adoption of self-care skills and reduction of barriers for early signs and symptoms recognition and intervention leading to self-care maintenance.    Hypertension Yes    Intervention Provide education on lifestyle modifcations including regular physical activity/exercise, weight management, moderate sodium restriction and increased consumption of fresh fruit, vegetables, and low fat dairy, alcohol  moderation, and smoking cessation.;Monitor prescription use compliance.    Expected Outcomes Short Term: Continued assessment and intervention until BP is < 140/5mm HG in hypertensive participants. < 130/47mm HG in hypertensive participants with diabetes, heart failure or chronic kidney disease.;Long Term: Maintenance of blood pressure at goal levels.    Lipids Yes    Intervention Provide education and support for participant on nutrition & aerobic/resistive exercise along with prescribed medications to achieve LDL 70mg , HDL >40mg .    Expected Outcomes Short Term: Participant states understanding of desired cholesterol values and is compliant with medications prescribed. Participant is following exercise  prescription and nutrition guidelines.;Long Term: Cholesterol controlled with medications as prescribed, with individualized exercise RX and with personalized nutrition plan. Value goals: LDL < 70mg , HDL > 40 mg.          Core Components/Risk Factors/Patient Goals Review:    Core Components/Risk Factors/Patient Goals at Discharge (Final Review):    ITP Comments:  ITP Comments     Row Name 10/08/23 1539           ITP Comments Patient arrived for 1st visit/orientation/education at 1330. Patient was referred to CR by Dr. Alyssa Backbone due to S/P AV replacement. During orientation advised patient on arrival and appointment times what to wear, what to do before, during and after exercise. Reviewed attendance and class policy.  Pt is scheduled to return Cardiac Rehab on 10/09/23 at 915. Pt was advised to come to class 15 minutes before class starts.  Discussed RPE/Dpysnea scales. Patient participated in warm up stretches. Patient was able to complete 6 minute walk test.  Telemetry:NSR with some PVC's. Patient was measured for the equipment. Discussed equipment safety with patient. Took patient pre-anthropometric measurements. Patient finished visit at 1510.          Comments: Patient arrived for 1st visit/orientation/education at 1330. Patient was referred to CR by Dr. Alyssa Backbone due to S/P AV replacement. During orientation advised patient on arrival and appointment times what to wear, what to do before,  during and after exercise. Reviewed attendance and class policy.  Pt is scheduled to return Cardiac Rehab on 10/09/23 at 915. Pt was advised to come to class 15 minutes before class starts.  Discussed RPE/Dpysnea scales. Patient participated in warm up stretches. Patient was able to complete 6 minute walk test.  Telemetry:NSR with some PVC's. Patient was measured for the equipment. Discussed equipment safety with patient. Took patient pre-anthropometric measurements. Patient finished visit at  1510.

## 2023-10-09 ENCOUNTER — Encounter (HOSPITAL_COMMUNITY)
Admission: RE | Admit: 2023-10-09 | Discharge: 2023-10-09 | Disposition: A | Discharge status: Home / Self Care | Source: Ambulatory Visit | Attending: Internal Medicine | Admitting: Internal Medicine

## 2023-10-09 DIAGNOSIS — Z953 Presence of xenogenic heart valve: Secondary | ICD-10-CM

## 2023-10-09 NOTE — Progress Notes (Signed)
 Daily Session Note  Patient Details  Name: Stephen Koch MRN: [id number] Date of Birth: 1961-03-13 Referring Provider:   Flowsheet Row CARDIAC REHAB PHASE II ORIENTATION from 10/08/2023 in [facility] CARDIAC REHABILITATION  Referring Provider Alyssa Backbone MD    Encounter Date: 10/09/2023  Check In:  Session Check In - 10/09/23 1045       Check-In   Supervising physician immediately available to respond to emergencies See telemetry face sheet for immediately available MD    Location [facility]    Staff Present Clotilda Danish, BS, Exercise Physiologist;Brittany Annette Barters, BSN, RN, Tisha Forget, RN, BSN    Virtual Visit No    Medication changes reported     No    Fall or balance concerns reported    No    Tobacco Cessation No Change    Current number of cigarettes/nicotine per day     1    Warm-up and Cool-down Performed on first and last piece of equipment    Resistance Training Performed Yes    VAD Patient? No    PAD/SET Patient? No      Pain Assessment   Currently in Pain? No/denies    Multiple Pain Sites No          Capillary Blood Glucose: No results found for this or any previous visit (from the past 24 hours).    Social History   Tobacco Use  Smoking Status Every Day   Types: Cigars  Smokeless Tobacco Never  Tobacco Comments   smokes about 1 black and milds per day     Goals Met:  Exercise tolerated well No report of concerns or symptoms today Strength training completed today  Goals Unmet:  Not Applicable  Comments: First full day of exercise!  Patient was oriented to gym and equipment including functions, settings, policies, and procedures.  Patient's individual exercise prescription and treatment plan were reviewed.  All starting workloads were established based on the results of the 6 minute walk test done at initial orientation visit.  The plan for exercise progression was also introduced and progression will be customized  based on patient's performance and goals.

## 2023-10-12 ENCOUNTER — Encounter (HOSPITAL_COMMUNITY)
Admission: RE | Admit: 2023-10-12 | Discharge: 2023-10-12 | Disposition: A | Discharge status: Home / Self Care | Source: Ambulatory Visit | Attending: Internal Medicine | Admitting: Internal Medicine

## 2023-10-12 DIAGNOSIS — J432 Centrilobular emphysema: Secondary | ICD-10-CM | POA: Diagnosis not present

## 2023-10-12 DIAGNOSIS — I10 Essential (primary) hypertension: Secondary | ICD-10-CM | POA: Diagnosis not present

## 2023-10-12 DIAGNOSIS — I700 Atherosclerosis of aorta: Secondary | ICD-10-CM | POA: Diagnosis not present

## 2023-10-12 DIAGNOSIS — I110 Hypertensive heart disease with heart failure: Secondary | ICD-10-CM | POA: Diagnosis not present

## 2023-10-12 DIAGNOSIS — I350 Nonrheumatic aortic (valve) stenosis: Secondary | ICD-10-CM | POA: Diagnosis not present

## 2023-10-12 DIAGNOSIS — E7800 Pure hypercholesterolemia, unspecified: Secondary | ICD-10-CM | POA: Diagnosis not present

## 2023-10-12 DIAGNOSIS — Z953 Presence of xenogenic heart valve: Secondary | ICD-10-CM

## 2023-10-12 DIAGNOSIS — I5032 Chronic diastolic (congestive) heart failure: Secondary | ICD-10-CM | POA: Diagnosis not present

## 2023-10-12 DIAGNOSIS — I7121 Aneurysm of the ascending aorta, without rupture: Secondary | ICD-10-CM | POA: Diagnosis not present

## 2023-10-12 DIAGNOSIS — E785 Hyperlipidemia, unspecified: Secondary | ICD-10-CM | POA: Diagnosis not present

## 2023-10-12 NOTE — Progress Notes (Signed)
 Daily Session Note  Patient Details  Name: Stephen Koch MRN: [id number] Date of Birth: December 21, 1960 Referring Provider:   Flowsheet Row CARDIAC REHAB PHASE II ORIENTATION from 10/08/2023 in [facility] CARDIAC REHABILITATION  Referring Provider Alyssa Backbone MD    Encounter Date: 10/12/2023  Check In:  Session Check In - 10/12/23 1415       Check-In   Supervising physician immediately available to respond to emergencies See telemetry face sheet for immediately available MD    Location [facility]    Staff Present Clotilda Danish, BS, Exercise Physiologist;Alice Vitelli Lincoln Renshaw, RN, BSN    Virtual Visit No    Medication changes reported     No    Fall or balance concerns reported    No    Tobacco Cessation No Change    Current number of cigarettes/nicotine per day     1    Warm-up and Cool-down Performed on first and last piece of equipment    Resistance Training Performed Yes    VAD Patient? No    PAD/SET Patient? No      Pain Assessment   Currently in Pain? No/denies    Multiple Pain Sites No          Capillary Blood Glucose: No results found for this or any previous visit (from the past 24 hours).    Social History   Tobacco Use  Smoking Status Every Day   Types: Cigars  Smokeless Tobacco Never  Tobacco Comments   smokes about 1 black and milds per day     Goals Met:  Independence with exercise equipment Exercise tolerated well No report of concerns or symptoms today Strength training completed today  Goals Unmet:  Not Applicable  Comments: Pt able to follow exercise prescription today without complaint.  Will continue to monitor for progression.

## 2023-10-14 ENCOUNTER — Encounter (HOSPITAL_COMMUNITY)
Admission: RE | Admit: 2023-10-14 | Discharge: 2023-10-14 | Disposition: A | Discharge status: Home / Self Care | Source: Ambulatory Visit | Attending: Internal Medicine | Admitting: Internal Medicine

## 2023-10-14 DIAGNOSIS — Z953 Presence of xenogenic heart valve: Secondary | ICD-10-CM | POA: Diagnosis not present

## 2023-10-14 NOTE — Progress Notes (Signed)
 Cardiac Individual Treatment Plan  Patient Details  Name: Stephen Koch MRN: [id number] Date of Birth: [birth year] Referring Provider:   Flowsheet Row CARDIAC REHAB PHASE II ORIENTATION from 10/08/2023 in [place] CARDIAC [facility]  Referring Provider Alyssa Backbone MD    Initial Encounter Date:  Flowsheet Row CARDIAC REHAB PHASE II ORIENTATION from 10/08/2023 in [place] CARDIAC REHABILITATION  Date 10/08/23    Visit Diagnosis: S/P aortic valve replacement with bioprosthetic valve  Patient's Home Medications on Admission:  Current Outpatient Medications:    amiodarone  (PACERONE ) 200 MG tablet, Take 200 mg by mouth two times daily for 10 days;then take 200 mg by mouth daily thereafter, Disp: 60 tablet, Rfl: 1   amLODipine  (NORVASC ) 10 MG tablet, Take 10 mg by mouth daily., Disp: , Rfl:    aspirin  EC 325 MG tablet, Take 1 tablet (325 mg total) by mouth daily., Disp: 100 tablet, Rfl: 0   atorvastatin  (LIPITOR) 40 MG tablet, Take 40 mg by mouth daily., Disp: , Rfl:    gabapentin  (NEURONTIN ) 300 MG capsule, Take 300 mg by mouth 2 (two) times daily. , Disp: , Rfl:    hydrocortisone  (ANUSOL -HC) 2.5 % rectal cream, Place 1 Application rectally 2 (two) times daily. As needed, Disp: 30 g, Rfl: 0   methocarbamol  (ROBAXIN ) 500 MG tablet, Take 500 mg by mouth 2 (two) times daily., Disp: , Rfl:    metoprolol  tartrate (LOPRESSOR ) 25 MG tablet, Take 1 tablet (25 mg total) by mouth 2 (two) times daily., Disp: 60 tablet, Rfl: 1   Multiple Vitamin (MULTIVITAMIN WITH MINERALS) TABS tablet, Take 1 tablet by mouth daily., Disp: , Rfl:    naproxen (NAPROSYN) 500 MG tablet, Take 500 mg by mouth 2 (two) times daily as needed for moderate pain (pain score 4-6)., Disp: , Rfl:    omeprazole  (PRILOSEC) 20 MG capsule, Take 1 capsule (20 mg total) by mouth daily. TAKE ONE CAPSULE BY MOUTH BEFORE BREAKFAST May take additional capsule before supper if needed, Disp: 90 capsule, Rfl: 3   Plecanatide  (TRULANCE )  3 MG TABS, Take 1 tablet (3 mg total) by mouth daily., Disp: 30 tablet, Rfl: 11   tamsulosin  (FLOMAX ) 0.4 MG CAPS capsule, Take 0.4 mg by mouth daily., Disp: , Rfl:   Past Medical History: Past Medical History:  Diagnosis Date   Arthritis    Chronic back pain    Forklift injury   Essential hypertension, benign    GERD (gastroesophageal reflux disease)    Heart murmur    History of medication noncompliance     Tobacco Use: Social History   Tobacco Use  Smoking Status Every Day   Types: Cigars  Smokeless Tobacco Never  Tobacco Comments   smokes about 1 black and milds per day     Labs: Review Flowsheet  More data may exist      Latest Ref Rng & Units 11/01/2009 11/11/2010 06/18/2023 08/18/2023 08/20/2023  Labs for ITP Cardiac and Pulmonary Rehab  Hemoglobin A1c 4.8 - 5.6 % - - - 4.7  -  PH, Arterial 7.35 - 7.45 - - 7.381  - 7.352  7.354  7.336  7.326  7.343  7.374   PCO2 arterial 32 - 48 mmHg - - 43.4  - 42.8  40.3  39.9  45.8  44.9  46.1   Bicarbonate 20.0 - 28.0 mmol/L - - 26.3  27.8  25.7  - 23.7  22.4  21.6  23.9  27.0  24.4  26.9   TCO2 22 -  32 mmol/L 27  25  28  29  27   - 25  24  23  25  25  25  26  29  26  27  26  28    Acid-base deficit 0.0 - 2.0 mmol/L - - - - 2.0  3.0  4.0  2.0  1.0   O2 Saturation % - - 65  78  91  - 99  98  96  100  89  100  100     Details       Multiple values from one day are sorted in reverse-chronological order         Capillary Blood Glucose: Lab Results  Component Value Date   GLUCAP 84 08/24/2023   GLUCAP 110 (H) 08/24/2023   GLUCAP 132 (H) 08/23/2023   GLUCAP 98 08/23/2023   GLUCAP 97 08/23/2023     Exercise Target Goals: Exercise Program Goal: Individual exercise prescription set using results from initial 6 min walk test and THRR while considering  patient's activity barriers and safety.   Exercise Prescription Goal: Starting with aerobic activity 30 plus minutes a day, 3 days per week for initial exercise prescription.  Provide home exercise prescription and guidelines that participant acknowledges understanding prior to discharge.  Activity Barriers & Risk Stratification:  Activity Barriers & Cardiac Risk Stratification - 10/08/23 1352       Activity Barriers & Cardiac Risk Stratification   Activity Barriers Shortness of Breath;Assistive Device;Balance Concerns;Arthritis   OA in hands.   Cardiac Risk Stratification Moderate          6 Minute Walk:  6 Minute Walk     Row Name 10/08/23 1515         6 Minute Walk   Phase Initial     Distance 900 feet     Walk Time 6 minutes     # of Rest Breaks 0     MPH 1.7     METS 2.62     RPE 13     Perceived Dyspnea  1     VO2 Peak 9.16     Symptoms No     Resting HR 51 bpm     Resting BP 150/100     Resting Oxygen Saturation  97 %     Exercise Oxygen Saturation  during 6 min walk 97 %     Max Ex. HR 63 bpm     Max Ex. BP 158/90     2 Minute Post BP 148/90        Oxygen Initial Assessment:   Oxygen Re-Evaluation:   Oxygen Discharge (Final Oxygen Re-Evaluation):   Initial Exercise Prescription:  Initial Exercise Prescription - 10/08/23 1500       Date of Initial Exercise RX and Referring Provider   Date 10/08/23    Referring Provider Alyssa Backbone MD      Treadmill   MPH 0.8    Grade 0    Minutes 15    METs 1.7      NuStep   Level 3    SPM 60    Minutes 15    METs 2      Prescription Details   Frequency (times per week) 3    Duration Progress to 30 minutes of continuous aerobic without signs/symptoms of physical distress      Intensity   THRR 40-80% of Max Heartrate 93-134    Ratings of Perceived Exertion 11-13    Perceived Dyspnea 0-4  Resistance Training   Training Prescription Yes    Weight 4    Reps 10-15          Perform Capillary Blood Glucose checks as needed.  Exercise Prescription Changes:   Exercise Prescription Changes     Row Name 10/08/23 1500             Response to Exercise    Blood Pressure (Admit) 150/100       Blood Pressure (Exercise) 158/90       Blood Pressure (Exit) 148/90       Heart Rate (Admit) 51 bpm       Heart Rate (Exercise) 63 bpm       Heart Rate (Exit) 54 bpm       Oxygen Saturation (Admit) 97 %       Oxygen Saturation (Exercise) 97 %       Oxygen Saturation (Exit) 97 %       Rating of Perceived Exertion (Exercise) 13       Perceived Dyspnea (Exercise) 1          Exercise Comments:   Exercise Goals and Review:   Exercise Goals     Row Name 10/08/23 1519             Exercise Goals   Increase Physical Activity Yes       Intervention Provide advice, education, support and counseling about physical activity/exercise needs.;Develop an individualized exercise prescription for aerobic and resistive training based on initial evaluation findings, risk stratification, comorbidities and participant's personal goals.       Expected Outcomes Short Term: Attend rehab on a regular basis to increase amount of physical activity.;Long Term: Add in home exercise to make exercise part of routine and to increase amount of physical activity.;Long Term: Exercising regularly at least 3-5 days a week.       Increase Strength and Stamina Yes       Intervention Provide advice, education, support and counseling about physical activity/exercise needs.;Develop an individualized exercise prescription for aerobic and resistive training based on initial evaluation findings, risk stratification, comorbidities and participant's personal goals.       Expected Outcomes Short Term: Perform resistance training exercises routinely during rehab and add in resistance training at home;Short Term: Increase workloads from initial exercise prescription for resistance, speed, and METs.;Long Term: Improve cardiorespiratory fitness, muscular endurance and strength as measured by increased METs and functional capacity ( )       Able to understand and use rate of perceived exertion  (RPE) scale Yes       Intervention Provide education and explanation on how to use RPE scale       Expected Outcomes Short Term: Able to use RPE daily in rehab to express subjective intensity level;Long Term:  Able to use RPE to guide intensity level when exercising independently       Able to understand and use Dyspnea scale Yes       Intervention Provide education and explanation on how to use Dyspnea scale       Expected Outcomes Short Term: Able to use Dyspnea scale daily in rehab to express subjective sense of shortness of breath during exertion;Long Term: Able to use Dyspnea scale to guide intensity level when exercising independently       Knowledge and understanding of Target Heart Rate Range (THRR) Yes       Intervention Provide education and explanation of THRR including how the numbers were predicted and where they are  located for reference       Expected Outcomes Short Term: Able to state/look up THRR;Long Term: Able to use THRR to govern intensity when exercising independently;Short Term: Able to use daily as guideline for intensity in rehab       Able to check pulse independently Yes       Intervention Provide education and demonstration on how to check pulse in carotid and radial arteries.;Review the importance of being able to check your own pulse for safety during independent exercise       Expected Outcomes Short Term: Able to explain why pulse checking is important during independent exercise;Long Term: Able to check pulse independently and accurately       Understanding of Exercise Prescription Yes       Intervention Provide education, explanation, and written materials on patient's individual exercise prescription       Expected Outcomes Short Term: Able to explain program exercise prescription;Long Term: Able to explain home exercise prescription to exercise independently          Exercise Goals Re-Evaluation :    Discharge Exercise Prescription (Final Exercise  Prescription Changes):  Exercise Prescription Changes - 10/08/23 1500       Response to Exercise   Blood Pressure (Admit) 150/100    Blood Pressure (Exercise) 158/90    Blood Pressure (Exit) 148/90    Heart Rate (Admit) 51 bpm    Heart Rate (Exercise) 63 bpm    Heart Rate (Exit) 54 bpm    Oxygen Saturation (Admit) 97 %    Oxygen Saturation (Exercise) 97 %    Oxygen Saturation (Exit) 97 %    Rating of Perceived Exertion (Exercise) 13    Perceived Dyspnea (Exercise) 1          Nutrition:  Target Goals: Understanding of nutrition guidelines, daily intake of sodium 1500mg , cholesterol 200mg , calories 30% from fat and 7% or less from saturated fats, daily to have 5 or more servings of fruits and vegetables.  Biometrics:  Pre Biometrics - 10/08/23 1520       Pre Biometrics   Height 6' 2 (1.88 m)    Weight 83.8 kg    Waist Circumference 39 inches    Hip Circumference 40 inches    Waist to Hip Ratio 0.98 %    BMI (Calculated) 23.71    Grip Strength 40.7 kg    Single Leg Stand 2.32 seconds           Nutrition Therapy Plan and Nutrition Goals:   Nutrition Assessments:  MEDIFICTS Score Key: >=70 Need to make dietary changes  40-70 Heart Healthy Diet <= 40 Therapeutic Level Cholesterol Diet  Flowsheet Row CARDIAC REHAB PHASE II ORIENTATION from 10/08/2023 in [facility] C[facility]AC REHAB[facility]IT[facility]  Picture Your Plate Total Score on Admission 35   Picture Your Plate Scores: <78 Unhealthy dietary pattern with much room for improvement. 41-50 Dietary pattern unlikely to meet recommendations for good health and room for improvement. 51-60 More healthful dietary pattern, with some room for improvement.  >60 Healthy dietary pattern, although there may be some specific behaviors that could be improved.    Nutrition Goals Re-Evaluation:   Nutrition Goals Discharge (Final Nutrition Goals Re-Evaluation):   Psychosocial: Target Goals: Acknowledge presence or absence of  significant depression and/or stress, maximize coping skills, provide positive support system. Participant is able to verbalize types and ability to use techniques and skills needed for reducing stress and depression.  Initial Review & Psychosocial Screening:  Initial Psych  Review & Screening - 10/08/23 1531       Initial Review   Current issues with Current Sleep Concerns      Family Dynamics   Good Support System? Yes      Barriers   Psychosocial barriers to participate in program The patient should benefit from training in stress management and relaxation.;There are no identifiable barriers or psychosocial needs.;Psychosocial barriers identified (see note)      Screening Interventions   Interventions Encouraged to exercise;To provide support and resources with identified psychosocial needs;Provide feedback about the scores to participant    Expected Outcomes Short Term goal: Utilizing psychosocial counselor, staff and physician to assist with identification of specific Stressors or current issues interfering with healing process. Setting desired goal for each stressor or current issue identified.;Long Term Goal: Stressors or current issues are controlled or eliminated.;Short Term goal: Identification and review with participant of any Quality of Life or Depression concerns found by scoring the questionnaire.;Long Term goal: The participant improves quality of Life and PHQ9 Scores as seen by post scores and/or verbalization of changes          Quality of Life Scores:  Quality of Life - 10/08/23 1521       Quality of Life   Select Quality of Life      Quality of Life Scores   Health/Function Pre 15.5 %    Socioeconomic Pre 28.8 %    Psych/Spiritual Pre 20 %    Family Pre 26.4 %    GLOBAL Pre 20.6 %         Scores of 19 and below usually indicate a poorer quality of life in these areas.  A difference of  2-3 points is a clinically meaningful difference.  A difference of 2-3  points in the total score of the Quality of Life Index has been associated with significant improvement in overall quality of life, self-image, physical symptoms, and general health in studies assessing change in quality of life.  PHQ-9: Review Flowsheet       10/08/2023  Depression screen PHQ 2/9  Decreased Interest 0  Down, Depressed, Hopeless 0  PHQ - 2 Score 0  Altered sleeping 3  Tired, decreased energy 1  Change in appetite 3  Feeling bad or failure about yourself  0  Trouble concentrating 0  Moving slowly or fidgety/restless 3  Suicidal thoughts 0  PHQ-9 Score 10  Difficult doing work/chores Very difficult   Interpretation of Total Score  Total Score Depression Severity:  1-4 = Minimal depression, 5-9 = Mild depression, 10-14 = Moderate depression, 15-19 = Moderately severe depression, 20-27 = Severe depression   Psychosocial Evaluation and Intervention:  Psychosocial Evaluation - 10/08/23 1531       Psychosocial Evaluation & Interventions   Interventions Stress management education;Relaxation education;Encouraged to exercise with the program and follow exercise prescription    Comments Patient was referred to CR with AV replacement. He denies any depression, anxiety or stressors. He lives along but is married. His wife is currently living with her sister as her caregiver. His PHQ-9 score was 10 due to sleeping too much during the day; overeating and having little energy. He says he has great support from his wife, mother, and 2 brothers. He does have trouble staying alseep at night but he thinks he sleeps too much during the day. He hopes this will improve when he feels stronger.  He seems very motivated to participate in the program and was very active prior to  his surgery. He want to improve his strength, stamina, and energy; get back to doing all the activities he was doing prior to his surgery and to quit smoking. He has no barriers identified to participate in the program.     Expected Outcomes Short Term: Patient will start the program and attend consistently. Long Term: Patient will complete the program meeting personal goals.    Continue Psychosocial Services  Follow up required by staff          Psychosocial Re-Evaluation:   Psychosocial Discharge (Final Psychosocial Re-Evaluation):   Vocational Rehabilitation: Provide vocational rehab assistance to qualifying candidates.   Vocational Rehab Evaluation & Intervention:  Vocational Rehab - 10/08/23 1527       Initial Vocational Rehab Evaluation & Intervention   Assessment shows need for Vocational Rehabilitation No      Vocational Rehab Re-Evaulation   Comments Patient is disabled.          Education: Education Goals: Education classes will be provided on a weekly basis, covering required topics. Participant will state understanding/return demonstration of topics presented.  Learning Barriers/Preferences:  Learning Barriers/Preferences - 10/08/23 1404       Learning Barriers/Preferences   Learning Barriers None    Learning Preferences Audio;Written Material;Skilled Demonstration          Education Topics: Hypertension, Hypertension Reduction -Define heart disease and high blood pressure. Discus how high blood pressure affects the body and ways to reduce high blood pressure.   Exercise and Your Heart -Discuss why it is important to exercise, the FITT principles of exercise, normal and abnormal responses to exercise, and how to exercise safely.   Angina -Discuss definition of angina, causes of angina, treatment of angina, and how to decrease risk of having angina.   Cardiac Medications -Review what the following cardiac medications are used for, how they affect the body, and side effects that may occur when taking the medications.  Medications include Aspirin , Beta blockers, calcium  channel blockers, ACE Inhibitors, angiotensin receptor blockers, diuretics, digoxin, and  antihyperlipidemics.   Congestive Heart Failure -Discuss the definition of CHF, how to live with CHF, the signs and symptoms of CHF, and how keep track of weight and sodium intake.   Heart Disease and Intimacy -Discus the effect sexual activity has on the heart, how changes occur during intimacy as we age, and safety during sexual activity.   Smoking Cessation / COPD -Discuss different methods to quit smoking, the health benefits of quitting smoking, and the definition of COPD.   Nutrition I: Fats -Discuss the types of cholesterol, what cholesterol does to the heart, and how cholesterol levels can be controlled.   Nutrition II: Labels -Discuss the different components of food labels and how to read food label   Heart Parts/Heart Disease and PAD -Discuss the anatomy of the heart, the pathway of blood circulation through the heart, and these are affected by heart disease.   Stress I: Signs and Symptoms -Discuss the causes of stress, how stress may lead to anxiety and depression, and ways to limit stress.   Stress II: Relaxation -Discuss different types of relaxation techniques to limit stress.   Warning Signs of Stroke / TIA -Discuss definition of a stroke, what the signs and symptoms are of a stroke, and how to identify when someone is having stroke.   Knowledge Questionnaire Score:  Knowledge Questionnaire Score - 10/08/23 1527       Knowledge Questionnaire Score   Pre Score 18/28  Core Components/Risk Factors/Patient Goals at Admission:  Personal Goals and Risk Factors at Admission - 10/08/23 1527       Core Components/Risk Factors/Patient Goals on Admission    Weight Management Weight Maintenance    Improve shortness of breath with ADL's Yes    Intervention Provide education, individualized exercise plan and daily activity instruction to help decrease symptoms of SOB with activities of daily living.    Expected Outcomes Short Term: Improve  cardiorespiratory fitness to achieve a reduction of symptoms when performing ADLs;Long Term: Be able to perform more ADLs without symptoms or delay the onset of symptoms    Heart Failure Yes    Intervention Provide a combined exercise and nutrition program that is supplemented with education, support and counseling about heart failure. Directed toward relieving symptoms such as shortness of breath, decreased exercise tolerance, and extremity edema.    Expected Outcomes Improve functional capacity of life;Short term: Attendance in program 2-3 days a week with increased exercise capacity. Reported lower sodium intake. Reported increased fruit and vegetable intake. Reports medication compliance.;Short term: Daily weights obtained and reported for increase. Utilizing diuretic protocols set by physician.;Long term: Adoption of self-care skills and reduction of barriers for early signs and symptoms recognition and intervention leading to self-care maintenance.    Hypertension Yes    Intervention Provide education on lifestyle modifcations including regular physical activity/exercise, weight management, moderate sodium restriction and increased consumption of fresh fruit, vegetables, and low fat dairy, alcohol  moderation, and smoking cessation.;Monitor prescription use compliance.    Expected Outcomes Short Term: Continued assessment and intervention until BP is < 140/20mm HG in hypertensive participants. < 130/34mm HG in hypertensive participants with diabetes, heart failure or chronic kidney disease.;Long Term: Maintenance of blood pressure at goal levels.    Lipids Yes    Intervention Provide education and support for participant on nutrition & aerobic/resistive exercise along with prescribed medications to achieve LDL 70mg , HDL >40mg .    Expected Outcomes Short Term: Participant states understanding of desired cholesterol values and is compliant with medications prescribed. Participant is following exercise  prescription and nutrition guidelines.;Long Term: Cholesterol controlled with medications as prescribed, with individualized exercise RX and with personalized nutrition plan. Value goals: LDL < 70mg , HDL > 40 mg.          Core Components/Risk Factors/Patient Goals Review:    Core Components/Risk Factors/Patient Goals at Discharge (Final Review):    ITP Comments:  ITP Comments     Row Name 10/08/23 1539 10/09/23 1105 10/14/23 1004       ITP Comments Patient arrived for 1st visit/orientation/education at 1330. Patient was referred to CR by Dr. Alyssa Backbone due to S/P AV replacement. During orientation advised patient on arrival and appointment times what to wear, what to do before, during and after exercise. Reviewed attendance and class policy.  Pt is scheduled to return Cardiac Rehab on 10/09/23 at 915. Pt was advised to come to class 15 minutes before class starts.  Discussed RPE/Dpysnea scales. Patient participated in warm up stretches. Patient was able to complete 6 minute walk test.  Telemetry:NSR with some PVC's. Patient was measured for the equipment. Discussed equipment safety with patient. Took patient pre-anthropometric measurements. Patient finished visit at 1510. First full day of exercise!  Patient was oriented to gym and equipment including functions, settings, policies, and procedures.  Patient's individual exercise prescription and treatment plan were reviewed.  All starting workloads were established based on the results of the 6 minute walk test done at  initial orientation visit.  The plan for exercise progression was also introduced and progression will be customized based on patient's performance and goals. 30 day review completed. ITP sent to Dr. Armida Lander, Medical Director of Cardiac Rehab. Continue with ITP unless changes are made by physician. Pt new to program        Comments: 30 Day Review

## 2023-10-14 NOTE — Progress Notes (Signed)
 Daily Session Note  Patient Details  Name: Stephen Koch MRN: [id number] Date of Birth: 10-Mar-1961 Referring Provider:   Flowsheet Row CARDIAC REHAB PHASE II ORIENTATION from 10/08/2023 in [facility] CARDIAC REHABILITATION  Referring Provider Alyssa Backbone MD    Encounter Date: 10/14/2023  Check In:  Session Check In - 10/14/23 0915       Check-In   Supervising physician immediately available to respond to emergencies See telemetry face sheet for immediately available MD    Location [facility]    Staff Present Clotilda Danish, BS, Exercise Physiologist;Brooke Rollin Clock, RN;Hillary Troutman BSN, RN    Virtual Visit No    Medication changes reported     No    Fall or balance concerns reported    No    Tobacco Cessation No Change    Warm-up and Cool-down Performed on first and last piece of equipment    Resistance Training Performed Yes    VAD Patient? No    PAD/SET Patient? No      Pain Assessment   Currently in Pain? No/denies    Multiple Pain Sites No          Capillary Blood Glucose: No results found for this or any previous visit (from the past 24 hours).    Social History   Tobacco Use  Smoking Status Every Day   Types: Cigars  Smokeless Tobacco Never  Tobacco Comments   smokes about 1 black and milds per day     Goals Met:  Independence with exercise equipment Exercise tolerated well No report of concerns or symptoms today Strength training completed today  Goals Unmet:  Not Applicable  Comments: Pt able to follow exercise prescription today without complaint.  Will continue to monitor for progression.

## 2023-10-16 ENCOUNTER — Encounter (HOSPITAL_COMMUNITY)
Admission: RE | Admit: 2023-10-16 | Discharge: 2023-10-16 | Disposition: A | Discharge status: Home / Self Care | Source: Ambulatory Visit | Attending: Internal Medicine | Admitting: Internal Medicine

## 2023-10-16 DIAGNOSIS — Z953 Presence of xenogenic heart valve: Secondary | ICD-10-CM | POA: Diagnosis not present

## 2023-10-16 DIAGNOSIS — R002 Palpitations: Secondary | ICD-10-CM | POA: Diagnosis not present

## 2023-10-16 NOTE — Progress Notes (Signed)
 Daily Session Note  Patient Details  Name: Stephen Koch MRN: [id number] Date of Birth: 02-27-1961 Referring Provider:   Flowsheet Row CARDIAC REHAB PHASE II ORIENTATION from 10/08/2023 in [facility] CARDIAC REHABILITATION  Referring Provider Alyssa Backbone MD    Encounter Date: 10/16/2023  Check In:  Session Check In - 10/16/23 0912       Check-In   Supervising physician immediately available to respond to emergencies See telemetry face sheet for immediately available MD    Location [facility]    Staff Present Clotilda Danish, BS, Exercise Physiologist;Brittany Annette Barters, BSN, RN, WTA-C;Other    Virtual Visit No    Medication changes reported     No    Fall or balance concerns reported    No    Tobacco Cessation No Change    Warm-up and Cool-down Performed on first and last piece of equipment    Resistance Training Performed Yes    VAD Patient? No    PAD/SET Patient? No      Pain Assessment   Currently in Pain? No/denies    Multiple Pain Sites No          Capillary Blood Glucose: No results found for this or any previous visit (from the past 24 hours).    Social History   Tobacco Use  Smoking Status Every Day   Types: Cigars  Smokeless Tobacco Never  Tobacco Comments   smokes about 1 black and milds per day     Goals Met:  Independence with exercise equipment Exercise tolerated well No report of concerns or symptoms today Strength training completed today  Goals Unmet:  Not Applicable  Comments: Pt able to follow exercise prescription today without complaint.  Will continue to monitor for progression.

## 2023-10-19 ENCOUNTER — Encounter (HOSPITAL_COMMUNITY)
Admission: RE | Admit: 2023-10-19 | Discharge: 2023-10-19 | Disposition: A | Discharge status: Home / Self Care | Source: Ambulatory Visit | Attending: Internal Medicine | Admitting: Internal Medicine

## 2023-10-19 DIAGNOSIS — Z953 Presence of xenogenic heart valve: Secondary | ICD-10-CM | POA: Diagnosis not present

## 2023-10-19 NOTE — Progress Notes (Signed)
 Daily Session Note  Patient Details  Name: Stephen Koch MRN: [id number] Date of Birth: 05/06/1960 Referring Provider:   Flowsheet Row CARDIAC REHAB PHASE II ORIENTATION from 10/08/2023 in [facility] CARDIAC REHABILITATION  Referring Provider Wendel Haws MD    Encounter Date: 10/19/2023  Check In:  Session Check In - 10/19/23 0915       Check-In   Supervising physician immediately available to respond to emergencies See telemetry face sheet for immediately available MD    Location [facility]    Staff Present Powell Benders, BS, Exercise Physiologist;Chrishana Spargur Jackquline, BSN, RN, WTA-C;Other;Laureen Delores, BS, RRT, CPFT    Virtual Visit No    Medication changes reported     No    Fall or balance concerns reported    No    Tobacco Cessation No Change    Warm-up and Cool-down Performed on first and last piece of equipment    Resistance Training Performed Yes    VAD Patient? No    PAD/SET Patient? No      Pain Assessment   Currently in Pain? No/denies          Capillary Blood Glucose: No results found for this or any previous visit (from the past 24 hours).    Social History   Tobacco Use  Smoking Status Every Day   Types: Cigars  Smokeless Tobacco Never  Tobacco Comments   smokes about 1 black and milds per day     Goals Met:  Independence with exercise equipment Exercise tolerated well No report of concerns or symptoms today Strength training completed today  Goals Unmet:  Not Applicable  Comments: Pt able to follow exercise prescription today without complaint.  Will continue to monitor for progression.

## 2023-10-21 ENCOUNTER — Encounter (HOSPITAL_COMMUNITY)
Admission: RE | Admit: 2023-10-21 | Discharge: 2023-10-21 | Disposition: A | Discharge status: Home / Self Care | Source: Ambulatory Visit | Attending: Internal Medicine

## 2023-10-21 DIAGNOSIS — Z953 Presence of xenogenic heart valve: Secondary | ICD-10-CM | POA: Diagnosis not present

## 2023-10-21 NOTE — Progress Notes (Signed)
 Daily Session Note  Patient Details  Name: Stephen Koch MRN: [id number] Date of Birth: 03/11/1961 Referring Provider:   Flowsheet Row CARDIAC REHAB PHASE II ORIENTATION from 10/08/2023 in [facility] CARDIAC REHABILITATION  Referring Provider Wendel Haws MD    Encounter Date: 10/21/2023  Check In:  Session Check In - 10/21/23 0915       Check-In   Supervising physician immediately available to respond to emergencies See telemetry face sheet for immediately available MD    Location [facility]    Staff Present Adrien Louder, RN, BSN;Heather Con, BS, Exercise Physiologist   Myrick Browner, [place]   Virtual Visit No    Medication changes reported     No    Fall or balance concerns reported    No    Tobacco Cessation No Change    Current number of cigarettes/nicotine per day     1    Warm-up and Cool-down Performed on first and last piece of equipment    Resistance Training Performed Yes    VAD Patient? No    PAD/SET Patient? No      Pain Assessment   Currently in Pain? No/denies    Multiple Pain Sites No          Capillary Blood Glucose: No results found for this or any previous visit (from the past 24 hours).    Social History   Tobacco Use  Smoking Status Every Day   Types: Cigars  Smokeless Tobacco Never  Tobacco Comments   smokes about 1 black and milds per day     Goals Met:  Independence with exercise equipment Exercise tolerated well No report of concerns or symptoms today Strength training completed today  Goals Unmet:  Not Applicable  Comments: Pt able to follow exercise prescription today without complaint.  Will continue to monitor for progression.

## 2023-10-23 ENCOUNTER — Encounter (HOSPITAL_COMMUNITY)
Admission: RE | Admit: 2023-10-23 | Discharge: 2023-10-23 | Disposition: A | Discharge status: Home / Self Care | Source: Ambulatory Visit | Attending: Internal Medicine | Admitting: Internal Medicine

## 2023-10-23 DIAGNOSIS — Z953 Presence of xenogenic heart valve: Secondary | ICD-10-CM | POA: Diagnosis not present

## 2023-10-23 NOTE — Progress Notes (Signed)
 Daily Session Note  Patient Details  Name: MIDAS DAUGHETY MRN: [id number] Date of Birth: 04-07-1961 Referring Provider:   Flowsheet Row CARDIAC REHAB PHASE II ORIENTATION from 10/08/2023 in [facility] CARDIAC REHABILITATION  Referring Provider Wendel Haws MD    Encounter Date: 10/23/2023  Check In:  Session Check In - 10/23/23 0916       Check-In   Supervising physician immediately available to respond to emergencies See telemetry face sheet for immediately available MD    Location [facility]    Staff Present Laymon Rattler, BSN, RN, Estrella Daring, BS, RRT, CPFT;Heather Con [place], Exercise Physiologist    Virtual Visit No    Medication changes reported     No    Fall or balance concerns reported    No    Tobacco Cessation No Change    Warm-up and Cool-down Performed on first and last piece of equipment    Resistance Training Performed Yes    VAD Patient? No    PAD/SET Patient? No      Pain Assessment   Currently in Pain? No/denies          Capillary Blood Glucose: No results found for this or any previous visit (from the past 24 hours).    Social History   Tobacco Use  Smoking Status Every Day   Types: Cigars  Smokeless Tobacco Never  Tobacco Comments   smokes about 1 black and milds per day     Goals Met:  Independence with exercise equipment Exercise tolerated well No report of concerns or symptoms today Strength training completed today  Goals Unmet:  Not Applicable  Comments: Pt able to follow exercise prescription today without complaint.  Will continue to monitor for progression.

## 2023-10-26 ENCOUNTER — Encounter (HOSPITAL_COMMUNITY)
Admission: RE | Admit: 2023-10-26 | Discharge: 2023-10-26 | Disposition: A | Discharge status: Home / Self Care | Source: Ambulatory Visit | Attending: Internal Medicine

## 2023-10-26 DIAGNOSIS — Z953 Presence of xenogenic heart valve: Secondary | ICD-10-CM | POA: Diagnosis not present

## 2023-10-26 NOTE — Progress Notes (Signed)
 Daily Session Note  Patient Details  Name: Stephen Koch MRN: [id number] Date of Birth: 1960-10-02 Referring Provider:   Flowsheet Row CARDIAC REHAB PHASE II ORIENTATION from 10/08/2023 in [facility] CARDIAC REHABILITATION  Referring Provider Wendel Haws MD    Encounter Date: 10/26/2023  Check In:  Session Check In - 10/26/23 9072       Check-In   Supervising physician immediately available to respond to emergencies See telemetry face sheet for immediately available MD    Location [facility]    Staff Present Powell Benders, BS, Exercise Physiologist;Laureen Delores, BS, RRT, CPFT;Debra Vicci, RN, BSN    Virtual Visit No    Medication changes reported     No    Fall or balance concerns reported    No    Warm-up and Cool-down Performed on first and last piece of equipment    Resistance Training Performed Yes    VAD Patient? No    PAD/SET Patient? No      Pain Assessment   Currently in Pain? No/denies          Capillary Blood Glucose: No results found for this or any previous visit (from the past 24 hours).    Social History   Tobacco Use  Smoking Status Every Day   Types: Cigars  Smokeless Tobacco Never  Tobacco Comments   smokes about 1 black and milds per day     Goals Met:  Independence with exercise equipment Exercise tolerated well No report of concerns or symptoms today Strength training completed today  Goals Unmet:  Not Applicable  Comments: Pt able to follow exercise prescription today without complaint.  Will continue to monitor for progression.

## 2023-10-28 ENCOUNTER — Encounter (HOSPITAL_COMMUNITY)
Admission: RE | Admit: 2023-10-28 | Discharge: 2023-10-28 | Disposition: A | Discharge status: Home / Self Care | Source: Ambulatory Visit | Attending: Internal Medicine | Admitting: Internal Medicine

## 2023-10-28 DIAGNOSIS — Z953 Presence of xenogenic heart valve: Secondary | ICD-10-CM | POA: Insufficient documentation

## 2023-10-28 NOTE — Progress Notes (Signed)
 Daily Session Note  Patient Details  Name: Stephen Koch MRN: [id number] Date of Birth: [birth year] Referring Provider:   Flowsheet Row CARDIAC REHAB PHASE II ORIENTATION from 10/08/2023 in [facility] CARDIAC REHABILITATION  Referring Provider Wendel Haws MD    Encounter Date: 10/28/2023  Check In:  Session Check In - 10/28/23 0900       Check-In   Supervising physician immediately available to respond to emergencies See telemetry face sheet for immediately available MD    Location [facility]    Staff Present Harlene Gelineau, MA, RCEP, CCRP, Sueellen Louder, RN, BSN;Heather Con, [place], Exercise Physiologist    Virtual Visit No    Medication changes reported     No    Fall or balance concerns reported    No    Tobacco Cessation No Change    Current number of cigarettes/nicotine per day     1    Warm-up and Cool-down Performed on first and last piece of equipment    Resistance Training Performed Yes    VAD Patient? No      Pain Assessment   Currently in Pain? No/denies    Multiple Pain Sites No          Capillary Blood Glucose: No results found for this or any previous visit (from the past 24 hours).    Social History   Tobacco Use  Smoking Status Every Day   Types: Cigars  Smokeless Tobacco Never  Tobacco Comments   smokes about 1 black and milds per day     Goals Met:  Independence with exercise equipment Exercise tolerated well No report of concerns or symptoms today Strength training completed today  Goals Unmet:  Not Applicable  Comments: Pt able to follow exercise prescription today without complaint.  Will continue to monitor for progression.

## 2023-11-02 ENCOUNTER — Encounter (HOSPITAL_COMMUNITY)

## 2023-11-04 ENCOUNTER — Encounter (HOSPITAL_COMMUNITY)
Admission: RE | Admit: 2023-11-04 | Discharge: 2023-11-04 | Disposition: A | Discharge status: Home / Self Care | Source: Ambulatory Visit | Attending: Internal Medicine

## 2023-11-04 ENCOUNTER — Other Ambulatory Visit: Payer: Self-pay | Admitting: Gastroenterology

## 2023-11-04 DIAGNOSIS — Z953 Presence of xenogenic heart valve: Secondary | ICD-10-CM

## 2023-11-04 DIAGNOSIS — K219 Gastro-esophageal reflux disease without esophagitis: Secondary | ICD-10-CM

## 2023-11-04 NOTE — Progress Notes (Signed)
 Daily Session Note  Patient Details  Name: Stephen Koch MRN: [id number] Date of Birth: 1960/08/01 Referring Provider:   Flowsheet Row CARDIAC REHAB PHASE II ORIENTATION from 10/08/2023 in [facility] CARDIAC REHABILITATION  Referring Provider Wendel Haws MD    Encounter Date: 11/04/2023  Check In:  Session Check In - 11/04/23 0949       Check-In   Supervising physician immediately available to respond to emergencies See telemetry face sheet for immediately available MD    Location [facility]    Staff Present Laymon Rattler, BSN, RN, Rosalba Gelineau, MA, RCEP, CCRP, CCET    Virtual Visit No    Medication changes reported     No    Fall or balance concerns reported    No    Tobacco Cessation No Change    Warm-up and Cool-down Performed on first and last piece of equipment    Resistance Training Performed Yes    VAD Patient? No    PAD/SET Patient? No      Pain Assessment   Currently in Pain? No/denies          Capillary Blood Glucose: No results found for this or any previous visit (from the past 24 hours).    Social History   Tobacco Use  Smoking Status Every Day   Types: Cigars  Smokeless Tobacco Never  Tobacco Comments   smokes about 1 black and milds per day     Goals Met:  Independence with exercise equipment Exercise tolerated well No report of concerns or symptoms today Strength training completed today  Goals Unmet:  Not Applicable  Comments: Pt able to follow exercise prescription today without complaint.  Will continue to monitor for progression.

## 2023-11-06 ENCOUNTER — Encounter (HOSPITAL_COMMUNITY)

## 2023-11-06 DIAGNOSIS — R002 Palpitations: Secondary | ICD-10-CM

## 2023-11-09 ENCOUNTER — Encounter (HOSPITAL_COMMUNITY)
Admission: RE | Admit: 2023-11-09 | Discharge: 2023-11-09 | Disposition: A | Discharge status: Home / Self Care | Source: Ambulatory Visit | Attending: Internal Medicine

## 2023-11-09 ENCOUNTER — Ambulatory Visit: Payer: Self-pay | Admitting: Physician Assistant

## 2023-11-09 DIAGNOSIS — Z953 Presence of xenogenic heart valve: Secondary | ICD-10-CM | POA: Diagnosis not present

## 2023-11-09 NOTE — Progress Notes (Signed)
 Daily Session Note  Patient Details  Name: Stephen Koch MRN: [id number] Date of Birth: [birth year] Referring Provider:   Flowsheet Row CARDIAC REHAB PHASE II ORIENTATION from 10/08/2023 in [facility] CARDIAC REHABILITATION  Referring Provider Wendel Haws MD    Encounter Date: 11/09/2023  Check In:  Session Check In - 11/09/23 0930       Check-In   Supervising physician immediately available to respond to emergencies See telemetry face sheet for immediately available MD    Location [facility]    Staff Present Laymon Rattler, BSN, RN, Rosalba Gelineau, MA, RCEP, CCRP, CCET;Heather [place], [place], Exercise Physiologist    Virtual Visit No    Medication changes reported     No    Fall or balance concerns reported    No    Warm-up and Cool-down Performed on first and last piece of equipment    Resistance Training Performed Yes    VAD Patient? No    PAD/SET Patient? No      Pain Assessment   Currently in Pain? No/denies          Capillary Blood Glucose: No results found for this or any previous visit (from the past 24 hours).    Social History   Tobacco Use  Smoking Status Every Day   Types: Cigars  Smokeless Tobacco Never  Tobacco Comments   smokes about 1 black and milds per day     Goals Met:  Independence with exercise equipment Exercise tolerated well No report of concerns or symptoms today Strength training completed today  Goals Unmet:  Not Applicable  Comments: Pt able to follow exercise prescription today without complaint.  Will continue to monitor for progression.

## 2023-11-11 ENCOUNTER — Encounter (HOSPITAL_COMMUNITY): Payer: Self-pay | Admitting: *Deleted

## 2023-11-11 ENCOUNTER — Encounter (HOSPITAL_COMMUNITY)
Admission: RE | Admit: 2023-11-11 | Discharge: 2023-11-11 | Disposition: A | Discharge status: Home / Self Care | Source: Ambulatory Visit | Attending: Internal Medicine | Admitting: Internal Medicine

## 2023-11-11 DIAGNOSIS — Z953 Presence of xenogenic heart valve: Secondary | ICD-10-CM | POA: Diagnosis not present

## 2023-11-11 NOTE — Progress Notes (Signed)
 Cardiac Individual Treatment Plan  Patient Details  Name: Stephen Koch MRN: [id number] Date of Birth: 04/02/1961 Referring Provider:   Flowsheet Row CARDIAC REHAB PHASE II ORIENTATION from 10/08/2023 in [facility]  Referring Provider Wendel Haws MD    Initial Encounter Date:  Flowsheet Row CARDIAC REHAB PHASE II ORIENTATION from 10/08/2023 in [place] [facility]  Date 10/08/23    Visit Diagnosis: S/P aortic valve replacement with bioprosthetic valve  Patient's Home Medications on Admission:  Current Outpatient Medications:    amiodarone  (PACERONE ) 200 MG tablet, Take 200 mg by mouth two times daily for 10 days;then take 200 mg by mouth daily thereafter, Disp: 60 tablet, Rfl: 1   amLODipine  (NORVASC ) 10 MG tablet, Take 10 mg by mouth daily., Disp: , Rfl:    aspirin  EC 325 MG tablet, Take 1 tablet (325 mg total) by mouth daily., Disp: 100 tablet, Rfl: 0   atorvastatin  (LIPITOR) 40 MG tablet, Take 40 mg by mouth daily., Disp: , Rfl:    gabapentin  (NEURONTIN ) 300 MG capsule, Take 300 mg by mouth 2 (two) times daily. , Disp: , Rfl:    hydrocortisone  (ANUSOL -HC) 2.5 % rectal cream, Place 1 Application rectally 2 (two) times daily. As needed, Disp: 30 g, Rfl: 0   methocarbamol  (ROBAXIN ) 500 MG tablet, Take 500 mg by mouth 2 (two) times daily., Disp: , Rfl:    metoprolol  tartrate (LOPRESSOR ) 25 MG tablet, Take 1 tablet (25 mg total) by mouth 2 (two) times daily., Disp: 60 tablet, Rfl: 1   Multiple Vitamin (MULTIVITAMIN WITH MINERALS) TABS tablet, Take 1 tablet by mouth daily., Disp: , Rfl:    naproxen (NAPROSYN) 500 MG tablet, Take 500 mg by mouth 2 (two) times daily as needed for moderate pain (pain score 4-6)., Disp: , Rfl:    omeprazole  (PRILOSEC) 20 MG capsule, TAKE 1 CAPSULE BY MOUTH DAILY BEFORE BREAKFAST. MAY TAKE ADDITIONAL CAPSULE BEFORE SUPPER IF NEEDED, Disp: 180 capsule, Rfl: 3   Plecanatide  (TRULANCE ) 3 MG TABS, Take 1 tablet (3 mg total)  by mouth daily., Disp: 30 tablet, Rfl: 11   tamsulosin  (FLOMAX ) 0.4 MG CAPS capsule, Take 0.4 mg by mouth daily., Disp: , Rfl:   Past Medical History: Past Medical History:  Diagnosis Date   Arthritis    Chronic back pain    Forklift injury   Essential hypertension, benign    GERD (gastroesophageal reflux disease)    Heart murmur    History of medication noncompliance     Tobacco Use: Social History   Tobacco Use  Smoking Status Every Day   Types: Cigars  Smokeless Tobacco Never  Tobacco Comments   smokes about 1 black and milds per day     Labs: Review Flowsheet  More data may exist      Latest Ref Rng & Units 11/01/2009 11/11/2010 06/18/2023 08/18/2023 08/20/2023  Labs for ITP Cardiac and Pulmonary Rehab  Hemoglobin A1c 4.8 - 5.6 % - - - 4.7  -  PH, Arterial 7.35 - 7.45 - - 7.381  - 7.352  7.354  7.336  7.326  7.343  7.374   PCO2 arterial 32 - 48 mmHg - - 43.4  - 42.8  40.3  39.9  45.8  44.9  46.1   Bicarbonate 20.0 - 28.0 mmol/L - - 26.3  27.8  25.7  - 23.7  22.4  21.6  23.9  27.0  24.4  26.9   TCO2 22 - 32 mmol/L 27  25  28  29  27  - 25  24  23  25  25  25  26  29  26  27  26  28    Acid-base deficit 0.0 - 2.0 mmol/L - - - - 2.0  3.0  4.0  2.0  1.0   O2 Saturation % - - 65  78  91  - 99  98  96  100  89  100  100     Details       Multiple values from one day are sorted in reverse-chronological order         Capillary Blood Glucose: Lab Results  Component Value Date   GLUCAP 84 08/24/2023   GLUCAP 110 (H) 08/24/2023   GLUCAP 132 (H) 08/23/2023   GLUCAP 98 08/23/2023   GLUCAP 97 08/23/2023     Exercise Target Goals: Exercise Program Goal: Individual exercise prescription set using results from initial 6 min walk test and THRR while considering  patient's activity barriers and safety.   Exercise Prescription Goal: Starting with aerobic activity 30 plus minutes a day, 3 days per week for initial exercise prescription. Provide home exercise prescription and  guidelines that participant acknowledges understanding prior to discharge.  Activity Barriers & Risk Stratification:  Activity Barriers & Cardiac Risk Stratification - 10/08/23 1352       Activity Barriers & Cardiac Risk Stratification   Activity Barriers Shortness of Breath;Assistive Device;Balance Concerns;Arthritis   OA in hands.   Cardiac Risk Stratification Moderate          6 Minute Walk:  6 Minute Walk     Row Name 10/08/23 1515         6 Minute Walk   Phase Initial     Distance 900 feet     Walk Time 6 minutes     # of Rest Breaks 0     MPH 1.7     METS 2.62     RPE 13     Perceived Dyspnea  1     VO2 Peak 9.16     Symptoms No     Resting HR 51 bpm     Resting BP 150/100     Resting Oxygen Saturation  97 %     Exercise Oxygen Saturation  during 6 min walk 97 %     Max Ex. HR 63 bpm     Max Ex. BP 158/90     2 Minute Post BP 148/90        Oxygen Initial Assessment:   Oxygen Re-Evaluation:   Oxygen Discharge (Final Oxygen Re-Evaluation):   Initial Exercise Prescription:  Initial Exercise Prescription - 10/08/23 1500       Date of Initial Exercise RX and Referring Provider   Date 10/08/23    Referring Provider Wendel Haws MD      Treadmill   MPH 0.8    Grade 0    Minutes 15    METs 1.7      NuStep   Level 3    SPM 60    Minutes 15    METs 2      Prescription Details   Frequency (times per week) 3    Duration Progress to 30 minutes of continuous aerobic without signs/symptoms of physical distress      Intensity   THRR 40-80% of Max Heartrate 93-134    Ratings of Perceived Exertion 11-13    Perceived Dyspnea 0-4      Resistance Training  Training Prescription Yes    Weight 4    Reps 10-15          Perform Capillary Blood Glucose checks as needed.  Exercise Prescription Changes:   Exercise Prescription Changes     Row Name 10/08/23 1500 10/19/23 1500 10/28/23 1300         Response to Exercise   Blood Pressure  (Admit) 150/100 114/62 144/84     Blood Pressure (Exercise) 158/90 158/78 --     Blood Pressure (Exit) 148/90 148/80 120/78     Heart Rate (Admit) 51 bpm 57 bpm 46 bpm     Heart Rate (Exercise) 63 bpm 90 bpm 116 bpm     Heart Rate (Exit) 54 bpm 51 bpm 88 bpm     Oxygen Saturation (Admit) 97 % -- --     Oxygen Saturation (Exercise) 97 % -- --     Oxygen Saturation (Exit) 97 % -- --     Rating of Perceived Exertion (Exercise) 13 13 13      Perceived Dyspnea (Exercise) 1 1 --     Duration -- Continue with 30 min of aerobic exercise without signs/symptoms of physical distress. Continue with 30 min of aerobic exercise without signs/symptoms of physical distress.     Intensity -- THRR unchanged THRR unchanged       Progression   Progression -- Continue to progress workloads to maintain intensity without signs/symptoms of physical distress. Continue to progress workloads to maintain intensity without signs/symptoms of physical distress.       Resistance Training   Training Prescription -- Yes Yes     Weight -- 4 4     Reps -- 10-15 10-15       Treadmill   MPH -- 2 1.6     Grade -- 0 0     Minutes -- 13 15     METs -- 2.53 2.23       NuStep   Level -- 6 --     SPM -- 90 --     Minutes -- 15 --     METs -- 2.2 --       Recumbant Elliptical   Level -- -- 3     RPM -- -- 45     Minutes -- -- 15     METs -- -- 3.1        Exercise Comments:   Exercise Goals and Review:   Exercise Goals     Row Name 10/08/23 1519             Exercise Goals   Increase Physical Activity Yes       Intervention Provide advice, education, support and counseling about physical activity/exercise needs.;Develop an individualized exercise prescription for aerobic and resistive training based on initial evaluation findings, risk stratification, comorbidities and participant's personal goals.       Expected Outcomes Short Term: Attend rehab on a regular basis to increase amount of physical  activity.;Long Term: Add in home exercise to make exercise part of routine and to increase amount of physical activity.;Long Term: Exercising regularly at least 3-5 days a week.       Increase Strength and Stamina Yes       Intervention Provide advice, education, support and counseling about physical activity/exercise needs.;Develop an individualized exercise prescription for aerobic and resistive training based on initial evaluation findings, risk stratification, comorbidities and participant's personal goals.       Expected Outcomes Short Term: Perform resistance training exercises  routinely during rehab and add in resistance training at home;Short Term: Increase workloads from initial exercise prescription for resistance, speed, and METs.;Long Term: Improve cardiorespiratory fitness, muscular endurance and strength as measured by increased METs and functional capacity ( )       Able to understand and use rate of perceived exertion (RPE) scale Yes       Intervention Provide education and explanation on how to use RPE scale       Expected Outcomes Short Term: Able to use RPE daily in rehab to express subjective intensity level;Long Term:  Able to use RPE to guide intensity level when exercising independently       Able to understand and use Dyspnea scale Yes       Intervention Provide education and explanation on how to use Dyspnea scale       Expected Outcomes Short Term: Able to use Dyspnea scale daily in rehab to express subjective sense of shortness of breath during exertion;Long Term: Able to use Dyspnea scale to guide intensity level when exercising independently       Knowledge and understanding of Target Heart Rate Range (THRR) Yes       Intervention Provide education and explanation of THRR including how the numbers were predicted and where they are located for reference       Expected Outcomes Short Term: Able to state/look up THRR;Long Term: Able to use THRR to govern intensity when  exercising independently;Short Term: Able to use daily as guideline for intensity in rehab       Able to check pulse independently Yes       Intervention Provide education and demonstration on how to check pulse in carotid and radial arteries.;Review the importance of being able to check your own pulse for safety during independent exercise       Expected Outcomes Short Term: Able to explain why pulse checking is important during independent exercise;Long Term: Able to check pulse independently and accurately       Understanding of Exercise Prescription Yes       Intervention Provide education, explanation, and written materials on patient's individual exercise prescription       Expected Outcomes Short Term: Able to explain program exercise prescription;Long Term: Able to explain home exercise prescription to exercise independently          Exercise Goals Re-Evaluation :    Discharge Exercise Prescription (Final Exercise Prescription Changes):  Exercise Prescription Changes - 10/28/23 1300       Response to Exercise   Blood Pressure (Admit) 144/84    Blood Pressure (Exit) 120/78    Heart Rate (Admit) 46 bpm    Heart Rate (Exercise) 116 bpm    Heart Rate (Exit) 88 bpm    Rating of Perceived Exertion (Exercise) 13    Duration Continue with 30 min of aerobic exercise without signs/symptoms of physical distress.    Intensity THRR unchanged      Progression   Progression Continue to progress workloads to maintain intensity without signs/symptoms of physical distress.      Resistance Training   Training Prescription Yes    Weight 4    Reps 10-15      Treadmill   MPH 1.6    Grade 0    Minutes 15    METs 2.23      Recumbant Elliptical   Level 3    RPM 45    Minutes 15    METs 3.1  Nutrition:  Target Goals: Understanding of nutrition guidelines, daily intake of sodium 1500mg , cholesterol 200mg , calories 30% from fat and 7% or less from saturated fats, daily to  have 5 or more servings of fruits and vegetables.  Biometrics:  Pre Biometrics - 10/08/23 1520       Pre Biometrics   Height 6' 2 (1.88 m)    Weight 184 lb 11.9 oz (83.8 kg)    Waist Circumference 39 inches    Hip Circumference 40 inches    Waist to Hip Ratio 0.98 %    BMI (Calculated) 23.71    Grip Strength 40.7 kg    Single Leg Stand 2.32 seconds           Nutrition Therapy Plan and Nutrition Goals:   Nutrition Assessments:  MEDIFICTS Score Key: >=70 Need to make dietary changes  40-70 Heart Healthy Diet <= 40 Therapeutic Level Cholesterol Diet  Flowsheet Row CARDIAC REHAB PHASE II ORIENTATION from 10/08/2023 in [facility]  Picture Your Plate Total Score on Admission 35   Picture Your Plate Scores: <59 Unhealthy dietary pattern with much room for improvement. 41-50 Dietary pattern unlikely to meet recommendations for good health and room for improvement. 51-60 More healthful dietary pattern, with some room for improvement.  >60 Healthy dietary pattern, although there may be some specific behaviors that could be improved.    Nutrition Goals Re-Evaluation:   Nutrition Goals Discharge (Final Nutrition Goals Re-Evaluation):   Psychosocial: Target Goals: Acknowledge presence or absence of significant depression and/or stress, maximize coping skills, provide positive support system. Participant is able to verbalize types and ability to use techniques and skills needed for reducing stress and depression.  Initial Review & Psychosocial Screening:  Initial Psych Review & Screening - 10/08/23 1531       Initial Review   Current issues with Current Sleep Concerns      Family Dynamics   Good Support System? Yes      Barriers   Psychosocial barriers to participate in program The patient should benefit from training in stress management and relaxation.;There are no identifiable barriers or psychosocial needs.;Psychosocial barriers identified  (see note)      Screening Interventions   Interventions Encouraged to exercise;To provide support and resources with identified psychosocial needs;Provide feedback about the scores to participant    Expected Outcomes Short Term goal: Utilizing psychosocial counselor, staff and physician to assist with identification of specific Stressors or current issues interfering with healing process. Setting desired goal for each stressor or current issue identified.;Long Term Goal: Stressors or current issues are controlled or eliminated.;Short Term goal: Identification and review with participant of any Quality of Life or Depression concerns found by scoring the questionnaire.;Long Term goal: The participant improves quality of Life and PHQ9 Scores as seen by post scores and/or verbalization of changes          Quality of Life Scores:  Quality of Life - 10/08/23 1521       Quality of Life   Select Quality of Life      Quality of Life Scores   Health/Function Pre 15.5 %    Socioeconomic Pre 28.8 %    Psych/Spiritual Pre 20 %    Family Pre 26.4 %    GLOBAL Pre 20.6 %         Scores of 19 and below usually indicate a poorer quality of life in these areas.  A difference of  2-3 points is a clinically meaningful difference.  A difference of 2-3 points in the total score of the Quality of Life Index has been associated with significant improvement in overall quality of life, self-image, physical symptoms, and general health in studies assessing change in quality of life.  PHQ-9: Review Flowsheet       10/08/2023  Depression screen PHQ 2/9  Decreased Interest 0  Down, Depressed, Hopeless 0  PHQ - 2 Score 0  Altered sleeping 3  Tired, decreased energy 1  Change in appetite 3  Feeling bad or failure about yourself  0  Trouble concentrating 0  Moving slowly or fidgety/restless 3  Suicidal thoughts 0  PHQ-9 Score 10  Difficult doing work/chores Very difficult   Interpretation of Total Score   Total Score Depression Severity:  1-4 = Minimal depression, 5-9 = Mild depression, 10-14 = Moderate depression, 15-19 = Moderately severe depression, 20-27 = Severe depression   Psychosocial Evaluation and Intervention:  Psychosocial Evaluation - 10/08/23 1531       Psychosocial Evaluation & Interventions   Interventions Stress management education;Relaxation education;Encouraged to exercise with the program and follow exercise prescription    Comments Patient was referred to CR with AV replacement. He denies any depression, anxiety or stressors. He lives along but is married. His wife is currently living with her sister as her caregiver. His PHQ-9 score was 10 due to sleeping too much during the day; overeating and having little energy. He says he has great support from his wife, mother, and 2 brothers. He does have trouble staying alseep at night but he thinks he sleeps too much during the day. He hopes this will improve when he feels stronger.  He seems very motivated to participate in the program and was very active prior to his surgery. He want to improve his strength, stamina, and energy; get back to doing all the activities he was doing prior to his surgery and to quit smoking. He has no barriers identified to participate in the program.    Expected Outcomes Short Term: Patient will start the program and attend consistently. Long Term: Patient will complete the program meeting personal goals.    Continue Psychosocial Services  Follow up required by staff          Psychosocial Re-Evaluation:   Psychosocial Discharge (Final Psychosocial Re-Evaluation):   Vocational Rehabilitation: Provide vocational rehab assistance to qualifying candidates.   Vocational Rehab Evaluation & Intervention:  Vocational Rehab - 10/08/23 1527       Initial Vocational Rehab Evaluation & Intervention   Assessment shows need for Vocational Rehabilitation No      Vocational Rehab Re-Evaulation    Comments Patient is disabled.          Education: Education Goals: Education classes will be provided on a weekly basis, covering required topics. Participant will state understanding/return demonstration of topics presented.  Learning Barriers/Preferences:  Learning Barriers/Preferences - 10/08/23 1404       Learning Barriers/Preferences   Learning Barriers None    Learning Preferences Audio;Written Material;Skilled Demonstration          Education Topics: Hypertension, Hypertension Reduction -Define heart disease and high blood pressure. Discus how high blood pressure affects the body and ways to reduce high blood pressure.   Exercise and Your Heart -Discuss why it is important to exercise, the FITT principles of exercise, normal and abnormal responses to exercise, and how to exercise safely. Flowsheet Row CARDIAC REHAB PHASE II EXERCISE from 11/04/2023 in [place] [facility]  Date 10/14/23  Educator  hj  Instruction Review Code 1- Verbalizes Understanding    Angina -Discuss definition of angina, causes of angina, treatment of angina, and how to decrease risk of having angina.   Cardiac Medications -Review what the following cardiac medications are used for, how they affect the body, and side effects that may occur when taking the medications.  Medications include Aspirin , Beta blockers, calcium  channel blockers, ACE Inhibitors, angiotensin receptor blockers, diuretics, digoxin, and antihyperlipidemics.   Congestive Heart Failure -Discuss the definition of CHF, how to live with CHF, the signs and symptoms of CHF, and how keep track of weight and sodium intake.   Heart Disease and Intimacy -Discus the effect sexual activity has on the heart, how changes occur during intimacy as we age, and safety during sexual activity. Flowsheet Row CARDIAC REHAB PHASE II EXERCISE from 11/04/2023 in [place] [facility]  Date 10/28/23  Educator [facility]   Instruction Review Code 1- Verbalizes Understanding    Smoking Cessation / COPD -Discuss different methods to quit smoking, the health benefits of quitting smoking, and the definition of COPD.   Nutrition I: Fats -Discuss the types of cholesterol, what cholesterol does to the heart, and how cholesterol levels can be controlled. Flowsheet Row CARDIAC REHAB PHASE II EXERCISE from 11/04/2023 in [place] [facility]  Date 11/04/23  Educator jh  Instruction Review Code 1- Verbalizes Understanding    Nutrition II: Labels -Discuss the different components of food labels and how to read food label Flowsheet Row CARDIAC REHAB PHASE II EXERCISE from 11/04/2023 in [place] [facility]  Date 11/04/23  Educator jh  Instruction Review Code 1- Verbalizes Understanding    Heart Parts/Heart Disease and PAD -Discuss the anatomy of the heart, the pathway of blood circulation through the heart, and these are affected by heart disease.   Stress I: Signs and Symptoms -Discuss the causes of stress, how stress may lead to anxiety and depression, and ways to limit stress.   Stress II: Relaxation -Discuss different types of relaxation techniques to limit stress.   Warning Signs of Stroke / TIA -Discuss definition of a stroke, what the signs and symptoms are of a stroke, and how to identify when someone is having stroke.   Knowledge Questionnaire Score:  Knowledge Questionnaire Score - 10/08/23 1527       Knowledge Questionnaire Score   Pre Score 18/28          Core Components/Risk Factors/Patient Goals at Admission:  Personal Goals and Risk Factors at Admission - 10/08/23 1527       Core Components/Risk Factors/Patient Goals on Admission    Weight Management Weight Maintenance    Improve shortness of breath with ADL's Yes    Intervention Provide education, individualized exercise plan and daily activity instruction to help decrease symptoms of SOB with  activities of daily living.    Expected Outcomes Short Term: Improve cardiorespiratory fitness to achieve a reduction of symptoms when performing ADLs;Long Term: Be able to perform more ADLs without symptoms or delay the onset of symptoms    Heart Failure Yes    Intervention Provide a combined exercise and nutrition program that is supplemented with education, support and counseling about heart failure. Directed toward relieving symptoms such as shortness of breath, decreased exercise tolerance, and extremity edema.    Expected Outcomes Improve functional capacity of life;Short term: Attendance in program 2-3 days a week with increased exercise capacity. Reported lower sodium intake. Reported increased fruit and vegetable intake. Reports medication compliance.;Short term:  Daily weights obtained and reported for increase. Utilizing diuretic protocols set by physician.;Long term: Adoption of self-care skills and reduction of barriers for early signs and symptoms recognition and intervention leading to self-care maintenance.    Hypertension Yes    Intervention Provide education on lifestyle modifcations including regular physical activity/exercise, weight management, moderate sodium restriction and increased consumption of fresh fruit, vegetables, and low fat dairy, alcohol  moderation, and smoking cessation.;Monitor prescription use compliance.    Expected Outcomes Short Term: Continued assessment and intervention until BP is < 140/82mm HG in hypertensive participants. < 130/32mm HG in hypertensive participants with diabetes, heart failure or chronic kidney disease.;Long Term: Maintenance of blood pressure at goal levels.    Lipids Yes    Intervention Provide education and support for participant on nutrition & aerobic/resistive exercise along with prescribed medications to achieve LDL 70mg , HDL >40mg .    Expected Outcomes Short Term: Participant states understanding of desired cholesterol values and is  compliant with medications prescribed. Participant is following exercise prescription and nutrition guidelines.;Long Term: Cholesterol controlled with medications as prescribed, with individualized exercise RX and with personalized nutrition plan. Value goals: LDL < 70mg , HDL > 40 mg.          Core Components/Risk Factors/Patient Goals Review:    Core Components/Risk Factors/Patient Goals at Discharge (Final Review):    ITP Comments:  ITP Comments     Row Name 10/08/23 1539 10/09/23 1105 10/14/23 1004 11/11/23 0833     ITP Comments Patient arrived for 1st visit/orientation/education at 1330. Patient was referred to CR by Dr. Lurena Red due to S/P AV replacement. During orientation advised patient on arrival and appointment times what to wear, what to do before, during and after exercise. Reviewed attendance and class policy.  Pt is scheduled to return Cardiac Rehab on 10/09/23 at 915. Pt was advised to come to class 15 minutes before class starts.  Discussed RPE/Dpysnea scales. Patient participated in warm up stretches. Patient was able to complete 6 minute walk test.  Telemetry:NSR with some PVC's. Patient was measured for the equipment. Discussed equipment safety with patient. Took patient pre-anthropometric measurements. Patient finished visit at 1510. First full day of exercise!  Patient was oriented to gym and equipment including functions, settings, policies, and procedures.  Patient's individual exercise prescription and treatment plan were reviewed.  All starting workloads were established based on the results of the 6 minute walk test done at initial orientation visit.  The plan for exercise progression was also introduced and progression will be customized based on patient's performance and goals. 30 day review completed. ITP sent to Dr. Dorn Ross, Medical Director of Cardiac Rehab. Continue with ITP unless changes are made by physician. Pt new to program 30 day review completed.  ITP sent to Dr. Dorn Ross, Medical Director of Cardiac Rehab. Continue with ITP unless changes are made by physician.       Comments: 30 day review

## 2023-11-11 NOTE — Progress Notes (Signed)
 Daily Session Note  Patient Details  Name: Stephen Koch MRN: [id number] Date of Birth: 03-Aug-1960 Referring Provider:   Flowsheet Row CARDIAC REHAB PHASE II ORIENTATION from 10/08/2023 in [facility] CARDIAC REHABILITATION  Referring Provider Wendel Haws MD    Encounter Date: 11/11/2023  Check In:  Session Check In - 11/11/23 0915       Check-In   Supervising physician immediately available to respond to emergencies See telemetry face sheet for immediately available MD    Location [facility]    Staff Present Laymon Rattler, BSN, RN, WTA-C;Heather Con, BS, Exercise Physiologist    Virtual Visit No    Medication changes reported     No    Fall or balance concerns reported    No    Tobacco Cessation No Change    Warm-up and Cool-down Performed on first and last piece of equipment    Resistance Training Performed Yes    VAD Patient? No    PAD/SET Patient? No      Pain Assessment   Currently in Pain? No/denies          Capillary Blood Glucose: No results found for this or any previous visit (from the past 24 hours).    Social History   Tobacco Use  Smoking Status Every Day   Types: Cigars  Smokeless Tobacco Never  Tobacco Comments   smokes about 1 black and milds per day     Goals Met:  Independence with exercise equipment Exercise tolerated well No report of concerns or symptoms today Strength training completed today  Goals Unmet:  Not Applicable  Comments: Pt able to follow exercise prescription today without complaint.  Will continue to monitor for progression.

## 2023-11-13 ENCOUNTER — Encounter (HOSPITAL_COMMUNITY)

## 2023-11-16 ENCOUNTER — Encounter (HOSPITAL_COMMUNITY)
Admission: RE | Admit: 2023-11-16 | Discharge: 2023-11-16 | Disposition: A | Discharge status: Home / Self Care | Source: Ambulatory Visit | Attending: Internal Medicine | Admitting: Internal Medicine

## 2023-11-16 DIAGNOSIS — Z953 Presence of xenogenic heart valve: Secondary | ICD-10-CM | POA: Diagnosis not present

## 2023-11-16 NOTE — Progress Notes (Signed)
 Daily Session Note  Patient Details  Name: Stephen Koch MRN: [id number] Date of Birth: 11/15/1960 Referring Provider:   Flowsheet Row CARDIAC REHAB PHASE II ORIENTATION from 10/08/2023 in [facility] CARDIAC REHABILITATION  Referring Provider Wendel Haws MD    Encounter Date: 11/16/2023  Check In:  Session Check In - 11/16/23 0930       Check-In   Supervising physician immediately available to respond to emergencies See telemetry face sheet for immediately available MD    Location [facility]    Staff Present Laymon Rattler, BSN, RN, WTA-C;Heather Con, BS, Exercise Physiologist;Phyllis Billingsley, RN;Laureen Delores, BS, RRT, CPFT    Virtual Visit No    Medication changes reported     No    Fall or balance concerns reported    No    Tobacco Cessation No Change    Warm-up and Cool-down Performed on first and last piece of equipment    Resistance Training Performed Yes    VAD Patient? No    PAD/SET Patient? No      Pain Assessment   Currently in Pain? No/denies          Capillary Blood Glucose: No results found for this or any previous visit (from the past 24 hours).    Social History   Tobacco Use  Smoking Status Every Day   Types: Cigars  Smokeless Tobacco Never  Tobacco Comments   smokes about 1 black and milds per day     Goals Met:  Independence with exercise equipment Exercise tolerated well No report of concerns or symptoms today Strength training completed today  Goals Unmet:  Not Applicable  Comments: Pt able to follow exercise prescription today without complaint.  Will continue to monitor for progression.

## 2023-11-16 NOTE — Telephone Encounter (Signed)
 Patient returned RN's call regarding results.

## 2023-11-18 ENCOUNTER — Encounter (HOSPITAL_COMMUNITY)
Admission: RE | Admit: 2023-11-18 | Discharge: 2023-11-18 | Disposition: A | Discharge status: Home / Self Care | Source: Ambulatory Visit | Attending: Internal Medicine

## 2023-11-18 DIAGNOSIS — Z953 Presence of xenogenic heart valve: Secondary | ICD-10-CM | POA: Diagnosis not present

## 2023-11-18 NOTE — Progress Notes (Signed)
 Daily Session Note  Patient Details  Name: Stephen Koch MRN: [id number] Date of Birth: Feb 13, 1961 Referring Provider:   Flowsheet Row CARDIAC REHAB PHASE II ORIENTATION from 10/08/2023 in [facility] CARDIAC REHABILITATION  Referring Provider Wendel Haws MD    Encounter Date: 11/18/2023  Check In:  Session Check In - 11/18/23 0930       Check-In   Supervising physician immediately available to respond to emergencies See telemetry face sheet for immediately available MD    Location [facility]    Staff Present Powell Benders, BS, Exercise Physiologist;Laureen Delores, BS, RRT, CPFT    Virtual Visit No    Medication changes reported     No    Fall or balance concerns reported    No    Tobacco Cessation No Change    Warm-up and Cool-down Performed on first and last piece of equipment    Resistance Training Performed Yes    VAD Patient? No    PAD/SET Patient? No      Pain Assessment   Currently in Pain? No/denies          Capillary Blood Glucose: No results found for this or any previous visit (from the past 24 hours).    Social History   Tobacco Use  Smoking Status Every Day   Types: Cigars  Smokeless Tobacco Never  Tobacco Comments   smokes about 1 black and milds per day     Goals Met:  Independence with exercise equipment Exercise tolerated well No report of concerns or symptoms today Strength training completed today  Goals Unmet:  Not Applicable  Comments: Pt able to follow exercise prescription today without complaint.  Will continue to monitor for progression.

## 2023-11-20 ENCOUNTER — Encounter (HOSPITAL_COMMUNITY)

## 2023-11-23 ENCOUNTER — Encounter (HOSPITAL_COMMUNITY)

## 2023-11-23 ENCOUNTER — Telehealth (HOSPITAL_COMMUNITY): Payer: Self-pay

## 2023-11-25 ENCOUNTER — Encounter (HOSPITAL_COMMUNITY)

## 2023-11-26 ENCOUNTER — Encounter: Payer: Self-pay | Admitting: Gastroenterology

## 2023-11-27 ENCOUNTER — Encounter (HOSPITAL_COMMUNITY)
Admission: RE | Admit: 2023-11-27 | Discharge: 2023-11-27 | Disposition: A | Discharge status: Home / Self Care | Source: Ambulatory Visit | Attending: Internal Medicine | Admitting: Internal Medicine

## 2023-11-27 ENCOUNTER — Telehealth (HOSPITAL_COMMUNITY): Payer: Self-pay

## 2023-11-27 DIAGNOSIS — F1010 Alcohol abuse, uncomplicated: Secondary | ICD-10-CM | POA: Insufficient documentation

## 2023-11-27 DIAGNOSIS — F17290 Nicotine dependence, other tobacco product, uncomplicated: Secondary | ICD-10-CM | POA: Insufficient documentation

## 2023-11-27 DIAGNOSIS — R9431 Abnormal electrocardiogram [ECG] [EKG]: Secondary | ICD-10-CM | POA: Insufficient documentation

## 2023-11-27 DIAGNOSIS — I10 Essential (primary) hypertension: Secondary | ICD-10-CM | POA: Insufficient documentation

## 2023-11-27 DIAGNOSIS — R1110 Vomiting, unspecified: Secondary | ICD-10-CM | POA: Insufficient documentation

## 2023-11-27 DIAGNOSIS — Z79899 Other long term (current) drug therapy: Secondary | ICD-10-CM | POA: Insufficient documentation

## 2023-11-27 DIAGNOSIS — E785 Hyperlipidemia, unspecified: Secondary | ICD-10-CM | POA: Insufficient documentation

## 2023-11-27 DIAGNOSIS — F1290 Cannabis use, unspecified, uncomplicated: Secondary | ICD-10-CM | POA: Insufficient documentation

## 2023-11-27 DIAGNOSIS — R42 Dizziness and giddiness: Secondary | ICD-10-CM | POA: Insufficient documentation

## 2023-11-27 DIAGNOSIS — R55 Syncope and collapse: Secondary | ICD-10-CM | POA: Insufficient documentation

## 2023-11-27 DIAGNOSIS — Z953 Presence of xenogenic heart valve: Secondary | ICD-10-CM | POA: Insufficient documentation

## 2023-11-27 DIAGNOSIS — N400 Enlarged prostate without lower urinary tract symptoms: Secondary | ICD-10-CM | POA: Insufficient documentation

## 2023-11-27 NOTE — Telephone Encounter (Signed)
 Called patient regarding his attendance at cardiac rehab. He continues to have chronic pain throughout his body. He hopes to return Monday 11/30/23.

## 2023-11-30 ENCOUNTER — Encounter (HOSPITAL_COMMUNITY)
Admission: RE | Admit: 2023-11-30 | Discharge: 2023-11-30 | Disposition: A | Discharge status: Home / Self Care | Source: Ambulatory Visit | Attending: Internal Medicine | Admitting: Internal Medicine

## 2023-11-30 DIAGNOSIS — F1010 Alcohol abuse, uncomplicated: Secondary | ICD-10-CM | POA: Diagnosis not present

## 2023-11-30 DIAGNOSIS — Z79899 Other long term (current) drug therapy: Secondary | ICD-10-CM | POA: Diagnosis not present

## 2023-11-30 DIAGNOSIS — F1290 Cannabis use, unspecified, uncomplicated: Secondary | ICD-10-CM | POA: Diagnosis not present

## 2023-11-30 DIAGNOSIS — F17290 Nicotine dependence, other tobacco product, uncomplicated: Secondary | ICD-10-CM | POA: Diagnosis not present

## 2023-11-30 DIAGNOSIS — R9431 Abnormal electrocardiogram [ECG] [EKG]: Secondary | ICD-10-CM | POA: Diagnosis not present

## 2023-11-30 DIAGNOSIS — Z953 Presence of xenogenic heart valve: Secondary | ICD-10-CM | POA: Diagnosis not present

## 2023-11-30 DIAGNOSIS — E785 Hyperlipidemia, unspecified: Secondary | ICD-10-CM | POA: Diagnosis not present

## 2023-11-30 DIAGNOSIS — R42 Dizziness and giddiness: Secondary | ICD-10-CM | POA: Diagnosis not present

## 2023-11-30 DIAGNOSIS — R1110 Vomiting, unspecified: Secondary | ICD-10-CM | POA: Diagnosis not present

## 2023-11-30 DIAGNOSIS — R079 Chest pain, unspecified: Secondary | ICD-10-CM | POA: Diagnosis present

## 2023-11-30 DIAGNOSIS — R55 Syncope and collapse: Secondary | ICD-10-CM | POA: Diagnosis not present

## 2023-11-30 DIAGNOSIS — N400 Enlarged prostate without lower urinary tract symptoms: Secondary | ICD-10-CM | POA: Diagnosis not present

## 2023-11-30 DIAGNOSIS — I517 Cardiomegaly: Secondary | ICD-10-CM | POA: Diagnosis not present

## 2023-11-30 DIAGNOSIS — I10 Essential (primary) hypertension: Secondary | ICD-10-CM | POA: Diagnosis not present

## 2023-11-30 DIAGNOSIS — I672 Cerebral atherosclerosis: Secondary | ICD-10-CM | POA: Diagnosis not present

## 2023-11-30 NOTE — Progress Notes (Signed)
 Daily Session Note  Patient Details  Name: Stephen Koch MRN: [id number] Date of Birth: 08-25-1960 Referring Provider:   Flowsheet Row CARDIAC REHAB PHASE II ORIENTATION from 10/08/2023 in [facility] CARDIAC REHABILITATION  Referring Provider Wendel Haws MD    Encounter Date: 11/30/2023  Check In:  Session Check In - 11/30/23 0915       Check-In   Supervising physician immediately available to respond to emergencies See telemetry face sheet for immediately available MD    Location [facility]    Staff Present Powell Benders, BS, Exercise Physiologist;Jessica Vonzell, MA, RCEP, CCRP, CCET;Brittany Jackquline, BSN, RN, WTA-C    Virtual Visit No    Medication changes reported     No    Fall or balance concerns reported    No    Tobacco Cessation No Change    Warm-up and Cool-down Performed on first and last piece of equipment    Resistance Training Performed Yes    VAD Patient? No    PAD/SET Patient? No      Pain Assessment   Currently in Pain? No/denies    Multiple Pain Sites No          Capillary Blood Glucose: No results found for this or any previous visit (from the past 24 hours).    Social History   Tobacco Use  Smoking Status Every Day   Types: Cigars  Smokeless Tobacco Never  Tobacco Comments   smokes about 1 black and milds per day     Goals Met:  Independence with exercise equipment Exercise tolerated well No report of concerns or symptoms today Strength training completed today  Goals Unmet:  Not Applicable  Comments: Pt able to follow exercise prescription today without complaint.  Will continue to monitor for progression.

## 2023-12-02 ENCOUNTER — Encounter (HOSPITAL_COMMUNITY)
Admission: RE | Admit: 2023-12-02 | Discharge: 2023-12-02 | Disposition: A | Discharge status: Home / Self Care | Source: Ambulatory Visit | Attending: Internal Medicine | Admitting: Internal Medicine

## 2023-12-02 DIAGNOSIS — Z953 Presence of xenogenic heart valve: Secondary | ICD-10-CM

## 2023-12-02 NOTE — Progress Notes (Signed)
 Daily Session Note  Patient Details  Name: Stephen Koch MRN: [id number] Date of Birth: 01/28/1961 Referring Provider:   Flowsheet Row CARDIAC REHAB PHASE II ORIENTATION from 10/08/2023 in [facility] CARDIAC REHABILITATION  Referring Provider Wendel Haws MD    Encounter Date: 12/02/2023  Check In:  Session Check In - 12/02/23 0935       Check-In   Supervising physician immediately available to respond to emergencies See telemetry face sheet for immediately available MD    Location [facility]    Staff Present Powell Benders, BS, Exercise Physiologist;Shalva Rozycki Vonzell, MA, RCEP, CCRP, CCET;Brittany Jackquline, BSN, RN, WTA-C    Virtual Visit No    Medication changes reported     No    Fall or balance concerns reported    No    Tobacco Cessation No Change    Warm-up and Cool-down Performed on first and last piece of equipment    Resistance Training Performed Yes    VAD Patient? No    PAD/SET Patient? No      Pain Assessment   Currently in Pain? No/denies          Capillary Blood Glucose: No results found for this or any previous visit (from the past 24 hours).    Social History   Tobacco Use  Smoking Status Every Day   Types: Cigars  Smokeless Tobacco Never  Tobacco Comments   smokes about 1 black and milds per day     Goals Met:  Independence with exercise equipment Exercise tolerated well No report of concerns or symptoms today Strength training completed today  Goals Unmet:  Not Applicable  Comments: Pt able to follow exercise prescription today without complaint.  Will continue to monitor for progression.

## 2023-12-04 ENCOUNTER — Encounter (HOSPITAL_COMMUNITY)

## 2023-12-07 ENCOUNTER — Encounter (HOSPITAL_COMMUNITY)
Admission: RE | Admit: 2023-12-07 | Discharge: 2023-12-07 | Disposition: A | Discharge status: Home / Self Care | Source: Ambulatory Visit | Attending: Internal Medicine | Admitting: Internal Medicine

## 2023-12-07 DIAGNOSIS — Z953 Presence of xenogenic heart valve: Secondary | ICD-10-CM

## 2023-12-07 NOTE — Progress Notes (Signed)
 Daily Session Note  Patient Details  Name: VERNOR MONNIG MRN: [id number] Date of Birth: 1961/01/07 Referring Provider:   Flowsheet Row CARDIAC REHAB PHASE II ORIENTATION from 10/08/2023 in [facility] CARDIAC REHABILITATION  Referring Provider Wendel Haws MD    Encounter Date: 12/07/2023  Check In:  Session Check In - 12/07/23 0920       Check-In   Supervising physician immediately available to respond to emergencies See telemetry face sheet for immediately available MD    Location [facility]    Staff Present Laymon Rattler, BSN, RN, Rosalba Gelineau, MA, RCEP, CCRP, CCET    Virtual Visit No    Medication changes reported     No    Fall or balance concerns reported    No    Tobacco Cessation No Change    Warm-up and Cool-down Performed on first and last piece of equipment    Resistance Training Performed Yes    VAD Patient? No    PAD/SET Patient? No      Pain Assessment   Currently in Pain? No/denies          Capillary Blood Glucose: No results found for this or any previous visit (from the past 24 hours).    Social History   Tobacco Use  Smoking Status Every Day   Types: Cigars  Smokeless Tobacco Never  Tobacco Comments   smokes about 1 black and milds per day     Goals Met:  Independence with exercise equipment Exercise tolerated well No report of concerns or symptoms today Strength training completed today  Goals Unmet:  Not Applicable  Comments: Pt able to follow exercise prescription today without complaint.  Will continue to monitor for progression.

## 2023-12-08 DIAGNOSIS — I350 Nonrheumatic aortic (valve) stenosis: Secondary | ICD-10-CM | POA: Diagnosis not present

## 2023-12-08 DIAGNOSIS — J432 Centrilobular emphysema: Secondary | ICD-10-CM | POA: Diagnosis not present

## 2023-12-08 DIAGNOSIS — I700 Atherosclerosis of aorta: Secondary | ICD-10-CM | POA: Diagnosis not present

## 2023-12-08 DIAGNOSIS — I110 Hypertensive heart disease with heart failure: Secondary | ICD-10-CM | POA: Diagnosis not present

## 2023-12-08 DIAGNOSIS — I5032 Chronic diastolic (congestive) heart failure: Secondary | ICD-10-CM | POA: Diagnosis not present

## 2023-12-08 DIAGNOSIS — I7121 Aneurysm of the ascending aorta, without rupture: Secondary | ICD-10-CM | POA: Diagnosis not present

## 2023-12-08 DIAGNOSIS — R001 Bradycardia, unspecified: Secondary | ICD-10-CM | POA: Diagnosis not present

## 2023-12-08 DIAGNOSIS — E7800 Pure hypercholesterolemia, unspecified: Secondary | ICD-10-CM | POA: Diagnosis not present

## 2023-12-09 ENCOUNTER — Encounter (HOSPITAL_COMMUNITY)
Admission: RE | Admit: 2023-12-09 | Discharge: 2023-12-09 | Disposition: A | Discharge status: Home / Self Care | Source: Ambulatory Visit | Attending: Internal Medicine

## 2023-12-09 ENCOUNTER — Encounter (HOSPITAL_COMMUNITY): Payer: Self-pay | Admitting: *Deleted

## 2023-12-09 DIAGNOSIS — Z953 Presence of xenogenic heart valve: Secondary | ICD-10-CM

## 2023-12-09 NOTE — Progress Notes (Signed)
 Cardiac Individual Treatment Plan  Patient Details  Name: Stephen Koch MRN: [id number] Date of Birth: 04-19-1961 Referring Provider:   Flowsheet Row CARDIAC REHAB PHASE II ORIENTATION from 10/08/2023 in [place] [facility]  Referring Provider Wendel Haws MD    Initial Encounter Date:  Flowsheet Row CARDIAC REHAB PHASE II ORIENTATION from 10/08/2023 in [place] [facility]  Date 10/08/23    Visit Diagnosis: S/P aortic valve replacement with bioprosthetic valve  Patient's Home Medications on Admission:  Current Outpatient Medications:    amiodarone  (PACERONE ) 200 MG tablet, Take 200 mg by mouth two times daily for 10 days;then take 200 mg by mouth daily thereafter, Disp: 60 tablet, Rfl: 1   amLODipine  (NORVASC ) 10 MG tablet, Take 10 mg by mouth daily., Disp: , Rfl:    aspirin  EC 325 MG tablet, Take 1 tablet (325 mg total) by mouth daily., Disp: 100 tablet, Rfl: 0   atorvastatin  (LIPITOR) 40 MG tablet, Take 40 mg by mouth daily., Disp: , Rfl:    gabapentin  (NEURONTIN ) 300 MG capsule, Take 300 mg by mouth 2 (two) times daily. , Disp: , Rfl:    hydrocortisone  (ANUSOL -HC) 2.5 % rectal cream, Place 1 Application rectally 2 (two) times daily. As needed, Disp: 30 g, Rfl: 0   methocarbamol  (ROBAXIN ) 500 MG tablet, Take 500 mg by mouth 2 (two) times daily., Disp: , Rfl:    metoprolol  tartrate (LOPRESSOR ) 25 MG tablet, Take 1 tablet (25 mg total) by mouth 2 (two) times daily., Disp: 60 tablet, Rfl: 1   Multiple Vitamin (MULTIVITAMIN WITH MINERALS) TABS tablet, Take 1 tablet by mouth daily., Disp: , Rfl:    naproxen (NAPROSYN) 500 MG tablet, Take 500 mg by mouth 2 (two) times daily as needed for moderate pain (pain score 4-6)., Disp: , Rfl:    omeprazole  (PRILOSEC) 20 MG capsule, TAKE 1 CAPSULE BY MOUTH DAILY BEFORE BREAKFAST. MAY TAKE ADDITIONAL CAPSULE BEFORE SUPPER IF NEEDED, Disp: 180 capsule, Rfl: 3   Plecanatide  (TRULANCE ) 3 MG TABS, Take 1 tablet (3 mg total)  by mouth daily., Disp: 30 tablet, Rfl: 11   tamsulosin  (FLOMAX ) 0.4 MG CAPS capsule, Take 0.4 mg by mouth daily., Disp: , Rfl:   Past Medical History: Past Medical History:  Diagnosis Date   Arthritis    Chronic back pain    Forklift injury   Essential hypertension, benign    GERD (gastroesophageal reflux disease)    Heart murmur    History of medication noncompliance     Tobacco Use: Social History   Tobacco Use  Smoking Status Every Day   Types: Cigars  Smokeless Tobacco Never  Tobacco Comments   smokes about 1 black and milds per day     Labs: Review Flowsheet  More data may exist      Latest Ref Rng & Units 11/01/2009 11/11/2010 06/18/2023 08/18/2023 08/20/2023  Labs for ITP Cardiac and Pulmonary Rehab  Hemoglobin A1c 4.8 - 5.6 % - - - 4.7  -  PH, Arterial 7.35 - 7.45 - - 7.381  - 7.352  7.354  7.336  7.326  7.343  7.374   PCO2 arterial 32 - 48 mmHg - - 43.4  - 42.8  40.3  39.9  45.8  44.9  46.1   Bicarbonate 20.0 - 28.0 mmol/L - - 26.3  27.8  25.7  - 23.7  22.4  21.6  23.9  27.0  24.4  26.9   TCO2 22 - 32 mmol/L 27  25  28  29  27  - 25  24  23  25  25  25  26  29  26  27  26  28    Acid-base deficit 0.0 - 2.0 mmol/L - - - - 2.0  3.0  4.0  2.0  1.0   O2 Saturation % - - 65  78  91  - 99  98  96  100  89  100  100     Details       Multiple values from one day are sorted in reverse-chronological order         Capillary Blood Glucose: Lab Results  Component Value Date   GLUCAP 84 08/24/2023   GLUCAP 110 (H) 08/24/2023   GLUCAP 132 (H) 08/23/2023   GLUCAP 98 08/23/2023   GLUCAP 97 08/23/2023     Exercise Target Goals: Exercise Program Goal: Individual exercise prescription set using results from initial 6 min walk test and THRR while considering  patient's activity barriers and safety.   Exercise Prescription Goal: Starting with aerobic activity 30 plus minutes a day, 3 days per week for initial exercise prescription. Provide home exercise prescription and  guidelines that participant acknowledges understanding prior to discharge.  Activity Barriers & Risk Stratification:  Activity Barriers & Cardiac Risk Stratification - 10/08/23 1352       Activity Barriers & Cardiac Risk Stratification   Activity Barriers Shortness of Breath;Assistive Device;Balance Concerns;Arthritis   OA in hands.   Cardiac Risk Stratification Moderate          6 Minute Walk:  6 Minute Walk     Row Name 10/08/23 1515         6 Minute Walk   Phase Initial     Distance 900 feet     Walk Time 6 minutes     # of Rest Breaks 0     MPH 1.7     METS 2.62     RPE 13     Perceived Dyspnea  1     VO2 Peak 9.16     Symptoms No     Resting HR 51 bpm     Resting BP 150/100     Resting Oxygen Saturation  97 %     Exercise Oxygen Saturation  during 6 min walk 97 %     Max Ex. HR 63 bpm     Max Ex. BP 158/90     2 Minute Post BP 148/90        Oxygen Initial Assessment:   Oxygen Re-Evaluation:   Oxygen Discharge (Final Oxygen Re-Evaluation):   Initial Exercise Prescription:  Initial Exercise Prescription - 10/08/23 1500       Date of Initial Exercise RX and Referring Provider   Date 10/08/23    Referring Provider Wendel Haws MD      Treadmill   MPH 0.8    Grade 0    Minutes 15    METs 1.7      NuStep   Level 3    SPM 60    Minutes 15    METs 2      Prescription Details   Frequency (times per week) 3    Duration Progress to 30 minutes of continuous aerobic without signs/symptoms of physical distress      Intensity   THRR 40-80% of Max Heartrate 93-134    Ratings of Perceived Exertion 11-13    Perceived Dyspnea 0-4      Resistance Training  Training Prescription Yes    Weight 4    Reps 10-15          Perform Capillary Blood Glucose checks as needed.  Exercise Prescription Changes:   Exercise Prescription Changes     Row Name 10/08/23 1500 10/19/23 1500 10/28/23 1300 11/16/23 1300 12/02/23 1300     Response to  Exercise   Blood Pressure (Admit) 150/100 114/62 144/84 120/68 98/62   Blood Pressure (Exercise) 158/90 158/78 -- -- --   Blood Pressure (Exit) 148/90 148/80 120/78 124/70 118/70   Heart Rate (Admit) 51 bpm 57 bpm 46 bpm 65 bpm 59 bpm   Heart Rate (Exercise) 63 bpm 90 bpm 116 bpm 81 bpm 80 bpm   Heart Rate (Exit) 54 bpm 51 bpm 88 bpm 72 bpm 75 bpm   Oxygen Saturation (Admit) 97 % -- -- -- --   Oxygen Saturation (Exercise) 97 % -- -- -- --   Oxygen Saturation (Exit) 97 % -- -- -- --   Rating of Perceived Exertion (Exercise) 13 13 13 13 15    Perceived Dyspnea (Exercise) 1 1 -- -- --   Duration -- Continue with 30 min of aerobic exercise without signs/symptoms of physical distress. Continue with 30 min of aerobic exercise without signs/symptoms of physical distress. Continue with 30 min of aerobic exercise without signs/symptoms of physical distress. Continue with 30 min of aerobic exercise without signs/symptoms of physical distress.   Intensity -- THRR unchanged THRR unchanged THRR unchanged THRR unchanged     Progression   Progression -- Continue to progress workloads to maintain intensity without signs/symptoms of physical distress. Continue to progress workloads to maintain intensity without signs/symptoms of physical distress. Continue to progress workloads to maintain intensity without signs/symptoms of physical distress. Continue to progress workloads to maintain intensity without signs/symptoms of physical distress.     Resistance Training   Training Prescription -- Yes Yes Yes Yes   Weight -- 4 4 4 4    Reps -- 10-15 10-15 10-15 10-15     Treadmill   MPH -- 2 1.6 -- --   Grade -- 0 0 -- --   Minutes -- 13 15 -- --   METs -- 2.53 2.23 -- --     NuStep   Level -- 6 -- 5 5   SPM -- 90 -- 104 87   Minutes -- 15 -- 15 15   METs -- 2.2 -- 2.6 2.3     Recumbant Elliptical   Level -- -- 3 5 5    RPM -- -- 45 40 50   Minutes -- -- 15 15 15    METs -- -- 3.1 3.9 3.8      Exercise  Comments:   Exercise Goals and Review:   Exercise Goals     Row Name 10/08/23 1519             Exercise Goals   Increase Physical Activity Yes       Intervention Provide advice, education, support and counseling about physical activity/exercise needs.;Develop an individualized exercise prescription for aerobic and resistive training based on initial evaluation findings, risk stratification, comorbidities and participant's personal goals.       Expected Outcomes Short Term: Attend rehab on a regular basis to increase amount of physical activity.;Long Term: Add in home exercise to make exercise part of routine and to increase amount of physical activity.;Long Term: Exercising regularly at least 3-5 days a week.       Increase Strength and Stamina Yes  Intervention Provide advice, education, support and counseling about physical activity/exercise needs.;Develop an individualized exercise prescription for aerobic and resistive training based on initial evaluation findings, risk stratification, comorbidities and participant's personal goals.       Expected Outcomes Short Term: Perform resistance training exercises routinely during rehab and add in resistance training at home;Short Term: Increase workloads from initial exercise prescription for resistance, speed, and METs.;Long Term: Improve cardiorespiratory fitness, muscular endurance and strength as measured by increased METs and functional capacity ( )       Able to understand and use rate of perceived exertion (RPE) scale Yes       Intervention Provide education and explanation on how to use RPE scale       Expected Outcomes Short Term: Able to use RPE daily in rehab to express subjective intensity level;Long Term:  Able to use RPE to guide intensity level when exercising independently       Able to understand and use Dyspnea scale Yes       Intervention Provide education and explanation on how to use Dyspnea scale       Expected  Outcomes Short Term: Able to use Dyspnea scale daily in rehab to express subjective sense of shortness of breath during exertion;Long Term: Able to use Dyspnea scale to guide intensity level when exercising independently       Knowledge and understanding of Target Heart Rate Range (THRR) Yes       Intervention Provide education and explanation of THRR including how the numbers were predicted and where they are located for reference       Expected Outcomes Short Term: Able to state/look up THRR;Long Term: Able to use THRR to govern intensity when exercising independently;Short Term: Able to use daily as guideline for intensity in rehab       Able to check pulse independently Yes       Intervention Provide education and demonstration on how to check pulse in carotid and radial arteries.;Review the importance of being able to check your own pulse for safety during independent exercise       Expected Outcomes Short Term: Able to explain why pulse checking is important during independent exercise;Long Term: Able to check pulse independently and accurately       Understanding of Exercise Prescription Yes       Intervention Provide education, explanation, and written materials on patient's individual exercise prescription       Expected Outcomes Short Term: Able to explain program exercise prescription;Long Term: Able to explain home exercise prescription to exercise independently          Exercise Goals Re-Evaluation :  Exercise Goals Re-Evaluation     Row Name 11/16/23 1004             Exercise Goal Re-Evaluation   Exercise Goals Review Increase Physical Activity;Increase Strength and Stamina;Able to understand and use rate of perceived exertion (RPE) scale;Knowledge and understanding of Target Heart Rate Range (THRR);Understanding of Exercise Prescription       Comments Stephen Koch has been having a hard time doing home exercise as his legs hurt him all the time and it makes it hard to get around. He  has been using a rollating walker to help with balance and get in more steps. He has been doing sitting exercises here at rehab like the XR and Nustep.       Expected Outcomes Short: Continue to attend rehab to increase strength and stamina. Long: Incorporate more exercise at home.  Discharge Exercise Prescription (Final Exercise Prescription Changes):  Exercise Prescription Changes - 12/02/23 1300       Response to Exercise   Blood Pressure (Admit) 98/62    Blood Pressure (Exit) 118/70    Heart Rate (Admit) 59 bpm    Heart Rate (Exercise) 80 bpm    Heart Rate (Exit) 75 bpm    Rating of Perceived Exertion (Exercise) 15    Duration Continue with 30 min of aerobic exercise without signs/symptoms of physical distress.    Intensity THRR unchanged      Progression   Progression Continue to progress workloads to maintain intensity without signs/symptoms of physical distress.      Resistance Training   Training Prescription Yes    Weight 4    Reps 10-15      NuStep   Level 5    SPM 87    Minutes 15    METs 2.3      Recumbant Elliptical   Level 5    RPM 50    Minutes 15    METs 3.8          Nutrition:  Target Goals: Understanding of nutrition guidelines, daily intake of sodium 1500mg , cholesterol 200mg , calories 30% from fat and 7% or less from saturated fats, daily to have 5 or more servings of fruits and vegetables.  Biometrics:  Pre Biometrics - 10/08/23 1520       Pre Biometrics   Height 6' 2 (1.88 m)    Weight 184 lb 11.9 oz (83.8 kg)    Waist Circumference 39 inches    Hip Circumference 40 inches    Waist to Hip Ratio 0.98 %    BMI (Calculated) 23.71    Grip Strength 40.7 kg    Single Leg Stand 2.32 seconds           Nutrition Therapy Plan and Nutrition Goals:   Nutrition Assessments:  MEDIFICTS Score Key: >=70 Need to make dietary changes  40-70 Heart Healthy Diet <= 40 Therapeutic Level Cholesterol Diet  Flowsheet Row CARDIAC  REHAB PHASE II ORIENTATION from 10/08/2023 in [facility]  Picture Your Plate Total Score on Admission 35   Picture Your Plate Scores: <59 Unhealthy dietary pattern with much room for improvement. 41-50 Dietary pattern unlikely to meet recommendations for good health and room for improvement. 51-60 More healthful dietary pattern, with some room for improvement.  >60 Healthy dietary pattern, although there may be some specific behaviors that could be improved.    Nutrition Goals Re-Evaluation:  Nutrition Goals Re-Evaluation     Row Name 11/16/23 1001             Goals   Nutrition Goal Healthy eating.       Comment Stephen Koch has cut back on his coffee intake, drinking more water  and less soda. He states he drinks about 2 16oz bottles a day. With eating he still is working on eating healthy as he kind of just eats what he wants now.       Expected Outcome Short: Continue to add in more water  daily. Long: Work on [employer].          Nutrition Goals Discharge (Final Nutrition Goals Re-Evaluation):  Nutrition Goals Re-Evaluation - 11/16/23 1001       Goals   Nutrition Goal Healthy eating.    Comment Stephen Koch has cut back on his coffee intake, drinking more water  and less soda. He states he drinks about 2 16oz bottles a  day. With eating he still is working on eating healthy as he kind of just eats what he wants now.    Expected Outcome Short: Continue to add in more water  daily. Long: Work on [employer].          Psychosocial: Target Goals: Acknowledge presence or absence of significant depression and/or stress, maximize coping skills, provide positive support system. Participant is able to verbalize types and ability to use techniques and skills needed for reducing stress and depression.  Initial Review & Psychosocial Screening:  Initial Psych Review & Screening - 10/08/23 1531       Initial Review   Current issues with Current Sleep Concerns       Family Dynamics   Good Support System? Yes      Barriers   Psychosocial barriers to participate in program The patient should benefit from training in stress management and relaxation.;There are no identifiable barriers or psychosocial needs.;Psychosocial barriers identified (see note)      Screening Interventions   Interventions Encouraged to exercise;To provide support and resources with identified psychosocial needs;Provide feedback about the scores to participant    Expected Outcomes Short Term goal: Utilizing psychosocial counselor, staff and physician to assist with identification of specific Stressors or current issues interfering with healing process. Setting desired goal for each stressor or current issue identified.;Long Term Goal: Stressors or current issues are controlled or eliminated.;Short Term goal: Identification and review with participant of any Quality of Life or Depression concerns found by scoring the questionnaire.;Long Term goal: The participant improves quality of Life and PHQ9 Scores as seen by post scores and/or verbalization of changes          Quality of Life Scores:  Quality of Life - 10/08/23 1521       Quality of Life   Select Quality of Life      Quality of Life Scores   Health/Function Pre 15.5 %    Socioeconomic Pre 28.8 %    Psych/Spiritual Pre 20 %    Family Pre 26.4 %    GLOBAL Pre 20.6 %         Scores of 19 and below usually indicate a poorer quality of life in these areas.  A difference of  2-3 points is a clinically meaningful difference.  A difference of 2-3 points in the total score of the Quality of Life Index has been associated with significant improvement in overall quality of life, self-image, physical symptoms, and general health in studies assessing change in quality of life.  PHQ-9: Review Flowsheet       10/08/2023  Depression screen PHQ 2/9  Decreased Interest 0  Down, Depressed, Hopeless 0  PHQ - 2 Score 0  Altered  sleeping 3  Tired, decreased energy 1  Change in appetite 3  Feeling bad or failure about yourself  0  Trouble concentrating 0  Moving slowly or fidgety/restless 3  Suicidal thoughts 0  PHQ-9 Score 10  Difficult doing work/chores Very difficult   Interpretation of Total Score  Total Score Depression Severity:  1-4 = Minimal depression, 5-9 = Mild depression, 10-14 = Moderate depression, 15-19 = Moderately severe depression, 20-27 = Severe depression   Psychosocial Evaluation and Intervention:  Psychosocial Evaluation - 10/08/23 1531       Psychosocial Evaluation & Interventions   Interventions Stress management education;Relaxation education;Encouraged to exercise with the program and follow exercise prescription    Comments Patient was referred to CR with AV replacement. He denies any  depression, anxiety or stressors. He lives along but is married. His wife is currently living with her sister as her caregiver. His PHQ-9 score was 10 due to sleeping too much during the day; overeating and having little energy. He says he has great support from his wife, mother, and 2 brothers. He does have trouble staying alseep at night but he thinks he sleeps too much during the day. He hopes this will improve when he feels stronger.  He seems very motivated to participate in the program and was very active prior to his surgery. He want to improve his strength, stamina, and energy; get back to doing all the activities he was doing prior to his surgery and to quit smoking. He has no barriers identified to participate in the program.    Expected Outcomes Short Term: Patient will start the program and attend consistently. Long Term: Patient will complete the program meeting personal goals.    Continue Psychosocial Services  Follow up required by staff          Psychosocial Re-Evaluation:  Psychosocial Re-Evaluation     Row Name 11/16/23 1000             Psychosocial Re-Evaluation   Current issues  with None Identified       Comments Stephen Koch is doing well in rehab. He states that he sleeps well at night and stays away from stress! He likes to stay positive!       Expected Outcomes Short: Continue to attend rehab. Long: Continue to stay positive and away from stress.       Interventions Encouraged to attend Cardiac Rehabilitation for the exercise       Continue Psychosocial Services  Follow up required by staff          Psychosocial Discharge (Final Psychosocial Re-Evaluation):  Psychosocial Re-Evaluation - 11/16/23 1000       Psychosocial Re-Evaluation   Current issues with None Identified    Comments Stephen Koch is doing well in rehab. He states that he sleeps well at night and stays away from stress! He likes to stay positive!    Expected Outcomes Short: Continue to attend rehab. Long: Continue to stay positive and away from stress.    Interventions Encouraged to attend Cardiac Rehabilitation for the exercise    Continue Psychosocial Services  Follow up required by staff          Vocational Rehabilitation: Provide vocational rehab assistance to qualifying candidates.   Vocational Rehab Evaluation & Intervention:  Vocational Rehab - 10/08/23 1527       Initial Vocational Rehab Evaluation & Intervention   Assessment shows need for Vocational Rehabilitation No      Vocational Rehab Re-Evaulation   Comments Patient is disabled.          Education: Education Goals: Education classes will be provided on a weekly basis, covering required topics. Participant will state understanding/return demonstration of topics presented.  Learning Barriers/Preferences:  Learning Barriers/Preferences - 10/08/23 1404       Learning Barriers/Preferences   Learning Barriers None    Learning Preferences Audio;Written Material;Skilled Demonstration          Education Topics: Hypertension, Hypertension Reduction -Define heart disease and high blood pressure. Discus how high blood pressure  affects the body and ways to reduce high blood pressure. Flowsheet Row CARDIAC REHAB PHASE II EXERCISE from 12/02/2023 in [place] [facility]  Date 11/18/23  Educator jh  Instruction Review Code 1- Verbalizes Understanding    Exercise  and Your Heart -Discuss why it is important to exercise, the FITT principles of exercise, normal and abnormal responses to exercise, and how to exercise safely. Flowsheet Row CARDIAC REHAB PHASE II EXERCISE from 12/02/2023 in [place] [facility]  Date 10/14/23  Educator hj  Instruction Review Code 1- Verbalizes Understanding    Angina -Discuss definition of angina, causes of angina, treatment of angina, and how to decrease risk of having angina.   Cardiac Medications -Review what the following cardiac medications are used for, how they affect the body, and side effects that may occur when taking the medications.  Medications include Aspirin , Beta blockers, calcium  channel blockers, ACE Inhibitors, angiotensin receptor blockers, diuretics, digoxin, and antihyperlipidemics.   Congestive Heart Failure -Discuss the definition of CHF, how to live with CHF, the signs and symptoms of CHF, and how keep track of weight and sodium intake. Flowsheet Row CARDIAC REHAB PHASE II EXERCISE from 12/02/2023 in [place] [facility]  Date 11/18/23  Educator jh  Instruction Review Code 1- Verbalizes Understanding    Heart Disease and Intimacy -Discus the effect sexual activity has on the heart, how changes occur during intimacy as we age, and safety during sexual activity. Flowsheet Row CARDIAC REHAB PHASE II EXERCISE from 12/02/2023 in [place] [facility]  Date 10/28/23  Educator [facility]  Instruction Review Code 1- Verbalizes Understanding    Smoking Cessation / COPD -Discuss different methods to quit smoking, the health benefits of quitting smoking, and the definition of COPD. Flowsheet Row CARDIAC REHAB PHASE II EXERCISE  from 12/02/2023 in [place] [facility]  Date 12/02/23  Educator Jh  Instruction Review Code 1- Verbalizes Understanding    Nutrition I: Fats -Discuss the types of cholesterol, what cholesterol does to the heart, and how cholesterol levels can be controlled. Flowsheet Row CARDIAC REHAB PHASE II EXERCISE from 12/02/2023 in [place] [facility]  Date 11/04/23  Educator jh  Instruction Review Code 1- Verbalizes Understanding    Nutrition II: Labels -Discuss the different components of food labels and how to read food label Flowsheet Row CARDIAC REHAB PHASE II EXERCISE from 12/02/2023 in [place] [facility]  Date 11/04/23  Educator jh  Instruction Review Code 1- Verbalizes Understanding    Heart Parts/Heart Disease and PAD -Discuss the anatomy of the heart, the pathway of blood circulation through the heart, and these are affected by heart disease.   Stress I: Signs and Symptoms -Discuss the causes of stress, how stress may lead to anxiety and depression, and ways to limit stress. Flowsheet Row CARDIAC REHAB PHASE II EXERCISE from 12/02/2023 in [place] [facility]  Date 11/11/23  Educator hb  Instruction Review Code 1- Verbalizes Understanding    Stress II: Relaxation -Discuss different types of relaxation techniques to limit stress. Flowsheet Row CARDIAC REHAB PHASE II EXERCISE from 12/02/2023 in [place] [facility]  Date 11/11/23  Educator hb  Instruction Review Code 1- Verbalizes Understanding    Warning Signs of Stroke / TIA -Discuss definition of a stroke, what the signs and symptoms are of a stroke, and how to identify when someone is having stroke.   Knowledge Questionnaire Score:  Knowledge Questionnaire Score - 10/08/23 1527       Knowledge Questionnaire Score   Pre Score 18/28          Core Components/Risk Factors/Patient Goals at Admission:  Personal Goals and Risk Factors at Admission -  10/08/23 1527       Core Components/Risk Factors/Patient  Goals on Admission    Weight Management Weight Maintenance    Improve shortness of breath with ADL's Yes    Intervention Provide education, individualized exercise plan and daily activity instruction to help decrease symptoms of SOB with activities of daily living.    Expected Outcomes Short Term: Improve cardiorespiratory fitness to achieve a reduction of symptoms when performing ADLs;Long Term: Be able to perform more ADLs without symptoms or delay the onset of symptoms    Heart Failure Yes    Intervention Provide a combined exercise and nutrition program that is supplemented with education, support and counseling about heart failure. Directed toward relieving symptoms such as shortness of breath, decreased exercise tolerance, and extremity edema.    Expected Outcomes Improve functional capacity of life;Short term: Attendance in program 2-3 days a week with increased exercise capacity. Reported lower sodium intake. Reported increased fruit and vegetable intake. Reports medication compliance.;Short term: Daily weights obtained and reported for increase. Utilizing diuretic protocols set by physician.;Long term: Adoption of self-care skills and reduction of barriers for early signs and symptoms recognition and intervention leading to self-care maintenance.    Hypertension Yes    Intervention Provide education on lifestyle modifcations including regular physical activity/exercise, weight management, moderate sodium restriction and increased consumption of fresh fruit, vegetables, and low fat dairy, alcohol  moderation, and smoking cessation.;Monitor prescription use compliance.    Expected Outcomes Short Term: Continued assessment and intervention until BP is < 140/1mm HG in hypertensive participants. < 130/82mm HG in hypertensive participants with diabetes, heart failure or chronic kidney disease.;Long Term: Maintenance of blood pressure at goal  levels.    Lipids Yes    Intervention Provide education and support for participant on nutrition & aerobic/resistive exercise along with prescribed medications to achieve LDL 70mg , HDL >40mg .    Expected Outcomes Short Term: Participant states understanding of desired cholesterol values and is compliant with medications prescribed. Participant is following exercise prescription and nutrition guidelines.;Long Term: Cholesterol controlled with medications as prescribed, with individualized exercise RX and with personalized nutrition plan. Value goals: LDL < 70mg , HDL > 40 mg.          Core Components/Risk Factors/Patient Goals Review:   Goals and Risk Factor Review     Row Name 11/16/23 1003             Core Components/Risk Factors/Patient Goals Review   Personal Goals Review Hypertension;Weight Management/Obesity       Review Stephen Koch is doing well in rehab. He states he is taking all his medications as prescribed and checks his BP occassionally at home.       Expected Outcomes Short: Continue to attend rehab. Long: Continue checking BP at home and report any abnormalities to medical staff.          Core Components/Risk Factors/Patient Goals at Discharge (Final Review):   Goals and Risk Factor Review - 11/16/23 1003       Core Components/Risk Factors/Patient Goals Review   Personal Goals Review Hypertension;Weight Management/Obesity    Review Stephen Koch is doing well in rehab. He states he is taking all his medications as prescribed and checks his BP occassionally at home.    Expected Outcomes Short: Continue to attend rehab. Long: Continue checking BP at home and report any abnormalities to medical staff.          ITP Comments:  ITP Comments     Row Name 10/08/23 1539 10/09/23 1105 10/14/23 1004 11/11/23 0833 11/27/23 0933   ITP Comments Patient arrived for  1st visit/orientation/education at 1330. Patient was referred to CR by Dr. Lurena Red due to S/P AV replacement. During  orientation advised patient on arrival and appointment times what to wear, what to do before, during and after exercise. Reviewed attendance and class policy.  Pt is scheduled to return Cardiac Rehab on 10/09/23 at 915. Pt was advised to come to class 15 minutes before class starts.  Discussed RPE/Dpysnea scales. Patient participated in warm up stretches. Patient was able to complete 6 minute walk test.  Telemetry:NSR with some PVC's. Patient was measured for the equipment. Discussed equipment safety with patient. Took patient pre-anthropometric measurements. Patient finished visit at 1510. First full day of exercise!  Patient was oriented to gym and equipment including functions, settings, policies, and procedures.  Patient's individual exercise prescription and treatment plan were reviewed.  All starting workloads were established based on the results of the 6 minute walk test done at initial orientation visit.  The plan for exercise progression was also introduced and progression will be customized based on patient's performance and goals. 30 day review completed. ITP sent to Dr. Dorn Ross, Medical Director of Cardiac Rehab. Continue with ITP unless changes are made by physician. Pt new to program 30 day review completed. ITP sent to Dr. Dorn Ross, Medical Director of Cardiac Rehab. Continue with ITP unless changes are made by physician. Called patient. He continues to have pain throughout his body. He plans to return Monday.    Row Name 12/09/23 0931 12/09/23 1002         ITP Comments Called patient regarding missed sessions. Left VM. 30 day review completed. ITP sent to Dr. Dorn Ross, Medical Director of Cardiac Rehab. Continue with ITP unless changes are made by physician.         Comments: 30 day review

## 2023-12-11 ENCOUNTER — Encounter (HOSPITAL_COMMUNITY): Admission: RE | Admit: 2023-12-11 | Source: Ambulatory Visit

## 2023-12-14 ENCOUNTER — Encounter (HOSPITAL_COMMUNITY)
Admission: RE | Admit: 2023-12-14 | Discharge: 2023-12-14 | Disposition: A | Discharge status: Home / Self Care | Source: Ambulatory Visit | Attending: Internal Medicine

## 2023-12-14 DIAGNOSIS — Z953 Presence of xenogenic heart valve: Secondary | ICD-10-CM

## 2023-12-14 NOTE — Progress Notes (Signed)
 Daily Session Note  Patient Details  Name: Stephen Koch MRN: [id number] Date of Birth: 1960/06/21 Referring Provider:   Flowsheet Row CARDIAC REHAB PHASE II ORIENTATION from 10/08/2023 in [facility] C[facility]  Referring Provider Wendel Haws MD    Encounter Date: 12/14/2023  Check In:  Session Check In - 12/14/23 9076       Check-In   Supervising physician immediately available to respond to emergencies See telemetry face sheet for immediately available MD    Location [facility]    Staff Present Powell Benders, BS, Exercise Physiologist;Jomayra Novitsky Jackquline, BSN, RN, WTA-C    Virtual Visit No    Medication changes reported     No    Fall or balance concerns reported    No    Tobacco Cessation No Change    Warm-up and Cool-down Performed on first and last piece of equipment    Resistance Training Performed Yes    VAD Patient? No    PAD/SET Patient? No      Pain Assessment   Currently in Pain? No/denies          Capillary Blood Glucose: No results found for this or any previous visit (from the past 24 hours).    Social History   Tobacco Use  Smoking Status Every Day   Types: Cigars  Smokeless Tobacco Never  Tobacco Comments   smokes about 1 black and milds per day     Goals Met:  Independence with exercise equipment Exercise tolerated well No report of concerns or symptoms today Strength training completed today  Goals Unmet:  Not Applicable  Comments:  Pt able to follow exercise prescription today without complaint.  Will continue to monitor for progression.

## 2023-12-16 ENCOUNTER — Encounter (HOSPITAL_COMMUNITY)
Admission: RE | Admit: 2023-12-16 | Discharge: 2023-12-16 | Disposition: A | Discharge status: Home / Self Care | Source: Ambulatory Visit | Attending: Internal Medicine | Admitting: Internal Medicine

## 2023-12-16 DIAGNOSIS — Z953 Presence of xenogenic heart valve: Secondary | ICD-10-CM

## 2023-12-16 NOTE — Progress Notes (Signed)
 Daily Session Note  Patient Details  Name: Stephen Koch MRN: [id number] Date of Birth: 10-Nov-1960 Referring Provider:   Flowsheet Row CARDIAC REHAB PHASE II ORIENTATION from 10/08/2023 in [facility] CARDIAC REHABIL[facility]  Referring Provider Wendel Haws MD    Encounter Date: 12/16/2023  Check In:  Session Check In - 12/16/23 0949       Check-In   Supervising physician immediately available to respond to emergencies See telemetry face sheet for immediately available MD    Location [facility]    Staff Present Adrien Louder, RN, BSN;Laureen Delores, BS, RRT, CPFT;Evoleth Nordmeyer Zina, RN;Brittany Jackquline, BSN, RN, WTA-C    Virtual Visit No    Medication changes reported     No    Fall or balance concerns reported    No    Warm-up and Cool-down Performed on first and last piece of equipment    Resistance Training Performed Yes    VAD Patient? No    PAD/SET Patient? No      Pain Assessment   Currently in Pain? No/denies          Capillary Blood Glucose: No results found for this or any previous visit (from the past 24 hours).    Social History   Tobacco Use  Smoking Status Every Day   Types: Cigars  Smokeless Tobacco Never  Tobacco Comments   smokes about 1 black and milds per day     Goals Met:  Independence with exercise equipment Exercise tolerated well No report of concerns or symptoms today Strength training completed today  Goals Unmet:  Not Applicable  Comments: Pt able to follow exercise prescription today without complaint.  Will continue to monitor for progression.

## 2023-12-18 ENCOUNTER — Encounter (HOSPITAL_COMMUNITY)
Admission: RE | Admit: 2023-12-18 | Discharge: 2023-12-18 | Disposition: A | Discharge status: Home / Self Care | Source: Ambulatory Visit | Attending: Internal Medicine | Admitting: Internal Medicine

## 2023-12-18 DIAGNOSIS — Z953 Presence of xenogenic heart valve: Secondary | ICD-10-CM

## 2023-12-18 NOTE — Progress Notes (Signed)
 Daily Session Note  Patient Details  Name: CAVAN BEARDEN MRN: [id number] Date of Birth: 17-Apr-1961 Referring Provider:   Flowsheet Row CARDIAC REHAB PHASE II ORIENTATION from 10/08/2023 in [facility]AC [facility]  Referring Provider Wendel Haws MD    Encounter Date: 12/18/2023  Check In:  Session Check In - 12/18/23 0909       Check-In   Supervising physician immediately available to respond to emergencies See telemetry face sheet for immediately available MD    Location [facility]    Staff Present Powell Benders, BS, Exercise Physiologist;Jessica Vonzell, MA, RCEP, CCRP, CCET    Virtual Visit No    Medication changes reported     No    Fall or balance concerns reported    No    Tobacco Cessation No Change    Warm-up and Cool-down Performed on first and last piece of equipment    Resistance Training Performed Yes    VAD Patient? No    PAD/SET Patient? No      Pain Assessment   Currently in Pain? No/denies          Capillary Blood Glucose: No results found for this or any previous visit (from the past 24 hours).    Social History   Tobacco Use  Smoking Status Every Day   Types: Cigars  Smokeless Tobacco Never  Tobacco Comments   smokes about 1 black and milds per day     Goals Met:  Independence with exercise equipment Exercise tolerated well No report of concerns or symptoms today Strength training completed today  Goals Unmet:  Not Applicable  Comments: Pt able to follow exercise prescription today without complaint.  Will continue to monitor for progression.

## 2023-12-21 ENCOUNTER — Encounter (HOSPITAL_COMMUNITY)
Admission: RE | Admit: 2023-12-21 | Discharge: 2023-12-21 | Disposition: A | Discharge status: Home / Self Care | Source: Ambulatory Visit | Attending: Internal Medicine

## 2023-12-21 DIAGNOSIS — Z953 Presence of xenogenic heart valve: Secondary | ICD-10-CM

## 2023-12-21 NOTE — Progress Notes (Signed)
 Daily Session Note  Patient Details  Name: Stephen Koch MRN: [id number] Date of Birth: [birth year] Referring Provider:   Flowsheet Row CARDIAC REHAB PHASE II ORIENTATION from 10/08/2023 in [facility] CARDIAC REHABIL[facility]  Referring Provider Wendel Haws MD    Encounter Date: 12/21/2023  Check In:  Session Check In - 12/21/23 0919       Check-In   Supervising physician immediately available to respond to emergencies See telemetry face sheet for immediately available MD    Location [facility]    Staff Present Laymon Rattler, BSN, RN, WTA-C;Heather Con, BS, Exercise Physiologist;Victoria Zina, RN    Virtual Visit No    Medication changes reported     No    Fall or balance concerns reported    No    Tobacco Cessation No Change    Warm-up and Cool-down Performed on first and last piece of equipment    Resistance Training Performed Yes    VAD Patient? No    PAD/SET Patient? No      Pain Assessment   Currently in Pain? No/denies          Capillary Blood Glucose: No results found for this or any previous visit (from the past 24 hours).    Social History   Tobacco Use  Smoking Status Every Day   Types: Cigars  Smokeless Tobacco Never  Tobacco Comments   smokes about 1 black and milds per day     Goals Met:  Independence with exercise equipment Exercise tolerated well No report of concerns or symptoms today Strength training completed today  Goals Unmet:  Not Applicable  Comments: Pt able to follow exercise prescription today without complaint.  Will continue to monitor for progression.

## 2023-12-23 ENCOUNTER — Encounter (HOSPITAL_COMMUNITY)
Admission: RE | Admit: 2023-12-23 | Discharge: 2023-12-23 | Disposition: A | Discharge status: Home / Self Care | Source: Ambulatory Visit | Attending: Internal Medicine

## 2023-12-23 DIAGNOSIS — Z953 Presence of xenogenic heart valve: Secondary | ICD-10-CM

## 2023-12-23 NOTE — Progress Notes (Signed)
 Daily Session Note  Patient Details  Name: Stephen Koch MRN: [id number] Date of Birth: 1961-01-17 Referring Provider:   Flowsheet Row CARDIAC REHAB PHASE II ORIENTATION from 10/08/2023 in [facility] CARDIAC REHABIL[facility]  Referring Provider Wendel Haws MD    Encounter Date: 12/23/2023  Check In:  Session Check In - 12/23/23 0915       Check-In   Supervising physician immediately available to respond to emergencies See telemetry face sheet for immediately available MD    Location [facility]    Staff Present Powell Benders, BS, Exercise Physiologist;Jessica Vonzell, MA, RCEP, CCRP, CCET    Virtual Visit No    Medication changes reported     No    Fall or balance concerns reported    No    Tobacco Cessation No Change    Warm-up and Cool-down Performed on first and last piece of equipment    Resistance Training Performed Yes    VAD Patient? No    PAD/SET Patient? No      Pain Assessment   Currently in Pain? No/denies    Multiple Pain Sites No          Capillary Blood Glucose: No results found for this or any previous visit (from the past 24 hours).    Social History   Tobacco Use  Smoking Status Every Day   Types: Cigars  Smokeless Tobacco Never  Tobacco Comments   smokes about 1 black and milds per day     Goals Met:  Independence with exercise equipment Exercise tolerated well No report of concerns or symptoms today Strength training completed today  Goals Unmet:  Not Applicable  Comments: Pt able to follow exercise prescription today without complaint.  Will continue to monitor for progression.

## 2023-12-25 ENCOUNTER — Encounter (HOSPITAL_COMMUNITY): Payer: Self-pay

## 2023-12-25 ENCOUNTER — Emergency Department (HOSPITAL_COMMUNITY)

## 2023-12-25 ENCOUNTER — Ambulatory Visit: Attending: Internal Medicine | Admitting: Internal Medicine

## 2023-12-25 ENCOUNTER — Encounter (HOSPITAL_COMMUNITY)
Admission: RE | Admit: 2023-12-25 | Discharge: 2023-12-25 | Disposition: A | Discharge status: Home / Self Care | Source: Ambulatory Visit | Attending: Internal Medicine | Admitting: Internal Medicine

## 2023-12-25 ENCOUNTER — Observation Stay (HOSPITAL_BASED_OUTPATIENT_CLINIC_OR_DEPARTMENT_OTHER)
Admission: EM | Admit: 2023-12-25 | Discharge: 2023-12-26 | Disposition: A | Discharge status: Home / Self Care | Source: Home / Self Care | Attending: Emergency Medicine | Admitting: Emergency Medicine

## 2023-12-25 VITALS — Ht 74.0 in | Wt 212.1 lb

## 2023-12-25 VITALS — BP 140/72 | HR 63 | Wt 211.0 lb

## 2023-12-25 DIAGNOSIS — I672 Cerebral atherosclerosis: Secondary | ICD-10-CM | POA: Diagnosis not present

## 2023-12-25 DIAGNOSIS — F1010 Alcohol abuse, uncomplicated: Secondary | ICD-10-CM | POA: Insufficient documentation

## 2023-12-25 DIAGNOSIS — N400 Enlarged prostate without lower urinary tract symptoms: Secondary | ICD-10-CM | POA: Insufficient documentation

## 2023-12-25 DIAGNOSIS — K219 Gastro-esophageal reflux disease without esophagitis: Secondary | ICD-10-CM | POA: Insufficient documentation

## 2023-12-25 DIAGNOSIS — Z953 Presence of xenogenic heart valve: Secondary | ICD-10-CM

## 2023-12-25 DIAGNOSIS — I9789 Other postprocedural complications and disorders of the circulatory system, not elsewhere classified: Secondary | ICD-10-CM

## 2023-12-25 DIAGNOSIS — F17210 Nicotine dependence, cigarettes, uncomplicated: Secondary | ICD-10-CM | POA: Insufficient documentation

## 2023-12-25 DIAGNOSIS — I517 Cardiomegaly: Secondary | ICD-10-CM | POA: Diagnosis not present

## 2023-12-25 DIAGNOSIS — I10 Essential (primary) hypertension: Secondary | ICD-10-CM | POA: Insufficient documentation

## 2023-12-25 DIAGNOSIS — R55 Syncope and collapse: Secondary | ICD-10-CM | POA: Diagnosis not present

## 2023-12-25 DIAGNOSIS — I350 Nonrheumatic aortic (valve) stenosis: Secondary | ICD-10-CM

## 2023-12-25 DIAGNOSIS — I447 Left bundle-branch block, unspecified: Secondary | ICD-10-CM | POA: Diagnosis not present

## 2023-12-25 DIAGNOSIS — Z952 Presence of prosthetic heart valve: Secondary | ICD-10-CM | POA: Insufficient documentation

## 2023-12-25 DIAGNOSIS — Z7902 Long term (current) use of antithrombotics/antiplatelets: Secondary | ICD-10-CM | POA: Diagnosis not present

## 2023-12-25 DIAGNOSIS — I4891 Unspecified atrial fibrillation: Secondary | ICD-10-CM | POA: Diagnosis not present

## 2023-12-25 DIAGNOSIS — I7121 Aneurysm of the ascending aorta, without rupture: Secondary | ICD-10-CM | POA: Insufficient documentation

## 2023-12-25 DIAGNOSIS — R1111 Vomiting without nausea: Secondary | ICD-10-CM | POA: Diagnosis not present

## 2023-12-25 DIAGNOSIS — Z743 Need for continuous supervision: Secondary | ICD-10-CM | POA: Diagnosis not present

## 2023-12-25 LAB — CBC WITH DIFFERENTIAL/PLATELET
Abs Immature Granulocytes: 0.02 K/uL (ref 0.00–0.07)
Basophils Absolute: 0 K/uL (ref 0.0–0.1)
Basophils Relative: 0 %
Eosinophils Absolute: 0.1 K/uL (ref 0.0–0.5)
Eosinophils Relative: 3 %
HCT: 41.6 % (ref 39.0–52.0)
Hemoglobin: 13.3 g/dL (ref 13.0–17.0)
Immature Granulocytes: 0 %
Lymphocytes Relative: 27 %
Lymphs Abs: 1.5 K/uL (ref 0.7–4.0)
MCH: 25.9 pg — ABNORMAL LOW (ref 26.0–34.0)
MCHC: 32 g/dL (ref 30.0–36.0)
MCV: 81.1 fL (ref 80.0–100.0)
Monocytes Absolute: 0.9 K/uL (ref 0.1–1.0)
Monocytes Relative: 16 %
Neutro Abs: 2.8 K/uL (ref 1.7–7.7)
Neutrophils Relative %: 54 %
Platelets: 210 K/uL (ref 150–400)
RBC: 5.13 MIL/uL (ref 4.22–5.81)
RDW: 20.4 % — ABNORMAL HIGH (ref 11.5–15.5)
WBC: 5.3 K/uL (ref 4.0–10.5)
nRBC: 0 % (ref 0.0–0.2)

## 2023-12-25 LAB — URINALYSIS, ROUTINE W REFLEX MICROSCOPIC
Bilirubin Urine: NEGATIVE
Glucose, UA: NEGATIVE mg/dL
Ketones, ur: NEGATIVE mg/dL
Leukocytes,Ua: NEGATIVE
Nitrite: NEGATIVE
Protein, ur: NEGATIVE mg/dL
Specific Gravity, Urine: 1.008 (ref 1.005–1.030)
pH: 6 (ref 5.0–8.0)

## 2023-12-25 LAB — RAPID URINE DRUG SCREEN, HOSP PERFORMED
Amphetamines: NOT DETECTED
Barbiturates: NOT DETECTED
Benzodiazepines: NOT DETECTED
Cocaine: POSITIVE — AB
Opiates: NOT DETECTED
Tetrahydrocannabinol: POSITIVE — AB

## 2023-12-25 LAB — COMPREHENSIVE METABOLIC PANEL WITH GFR
ALT: 18 U/L (ref 0–44)
AST: 20 U/L (ref 15–41)
Albumin: 3.7 g/dL (ref 3.5–5.0)
Alkaline Phosphatase: 62 U/L (ref 38–126)
Anion gap: 12 (ref 5–15)
BUN: 15 mg/dL (ref 8–23)
CO2: 26 mmol/L (ref 22–32)
Calcium: 9.1 mg/dL (ref 8.9–10.3)
Chloride: 101 mmol/L (ref 98–111)
Creatinine, Ser: 1.06 mg/dL (ref 0.61–1.24)
GFR, Estimated: >60 mL/min (ref >60)
Glucose, Bld: 95 mg/dL (ref 70–99)
Potassium: 3.5 mmol/L (ref 3.5–5.1)
Sodium: 139 mmol/L (ref 135–145)
Total Bilirubin: 0.8 mg/dL (ref 0.0–1.2)
Total Protein: 7.1 g/dL (ref 6.5–8.1)

## 2023-12-25 LAB — ETHANOL: Alcohol, Ethyl (B): <15 mg/dL (ref <15)

## 2023-12-25 LAB — TROPONIN I (HIGH SENSITIVITY)
Troponin I (High Sensitivity): 9 ng/L (ref <18)
Troponin I (High Sensitivity): 8 ng/L (ref <18)

## 2023-12-25 MED ORDER — PANTOPRAZOLE SODIUM 40 MG PO TBEC
40.0000 mg | DELAYED_RELEASE_TABLET | Freq: Every day | ORAL | Status: DC
  Administered 2023-12-26: 40 mg via ORAL
  Filled 2023-12-25: qty 1

## 2023-12-25 MED ORDER — SODIUM CHLORIDE 0.9 % IV BOLUS
1000.0000 mL | Freq: Once | INTRAVENOUS | Status: AC
  Administered 2023-12-25: 1000 mL via INTRAVENOUS

## 2023-12-25 MED ORDER — HEPARIN SODIUM (PORCINE) 5000 UNIT/ML IJ SOLN: 5000.0000 [IU] | Freq: Three times a day (TID) | INTRAMUSCULAR | Status: DC

## 2023-12-25 MED ORDER — CHLORTHALIDONE 25 MG PO TABS: 25.0000 mg | ORAL_TABLET | Freq: Every day | ORAL | 3 refills | Status: AC

## 2023-12-25 MED ORDER — ACETAMINOPHEN 650 MG RE SUPP: 650.0000 mg | Freq: Four times a day (QID) | RECTAL | Status: DC | PRN

## 2023-12-25 MED ORDER — TAMSULOSIN HCL 0.4 MG PO CAPS
0.4000 mg | ORAL_CAPSULE | Freq: Every day | ORAL | Status: DC
  Administered 2023-12-26: 0.4 mg via ORAL
  Filled 2023-12-25: qty 1

## 2023-12-25 MED ORDER — SODIUM CHLORIDE 0.9% FLUSH
3.0000 mL | Freq: Two times a day (BID) | INTRAVENOUS | Status: DC
  Administered 2023-12-25: 3 mL via INTRAVENOUS

## 2023-12-25 MED ORDER — ONDANSETRON HCL 4 MG PO TABS: 4.0000 mg | ORAL_TABLET | Freq: Four times a day (QID) | ORAL | Status: DC | PRN

## 2023-12-25 MED ORDER — AMLODIPINE BESYLATE 5 MG PO TABS
10.0000 mg | ORAL_TABLET | Freq: Every day | ORAL | Status: DC
  Administered 2023-12-26: 10 mg via ORAL
  Filled 2023-12-25: qty 2

## 2023-12-25 MED ORDER — ACETAMINOPHEN 325 MG PO TABS
650.0000 mg | ORAL_TABLET | Freq: Four times a day (QID) | ORAL | Status: DC | PRN
Start: 2023-12-25 — End: 2023-12-26

## 2023-12-25 MED ORDER — GABAPENTIN 300 MG PO CAPS
300.0000 mg | ORAL_CAPSULE | Freq: Two times a day (BID) | ORAL | Status: DC
  Administered 2023-12-25 – 2023-12-26 (×2): 300 mg via ORAL
  Filled 2023-12-25 (×2): qty 1

## 2023-12-25 MED ORDER — ONDANSETRON HCL 4 MG/2ML IJ SOLN: 4.0000 mg | Freq: Four times a day (QID) | INTRAMUSCULAR | Status: DC | PRN

## 2023-12-25 MED ORDER — AMLODIPINE BESYLATE 5 MG PO TABS
10.0000 mg | ORAL_TABLET | Freq: Once | ORAL | Status: AC
  Administered 2023-12-25: 10 mg via ORAL
  Filled 2023-12-25: qty 2

## 2023-12-25 MED ORDER — FOLIC ACID 1 MG PO TABS
1.0000 mg | ORAL_TABLET | Freq: Every day | ORAL | Status: DC
  Administered 2023-12-25 – 2023-12-26 (×2): 1 mg via ORAL
  Filled 2023-12-25 (×2): qty 1

## 2023-12-25 MED ORDER — THIAMINE MONONITRATE 100 MG PO TABS
100.0000 mg | ORAL_TABLET | Freq: Every day | ORAL | Status: DC
  Administered 2023-12-25 – 2023-12-26 (×2): 100 mg via ORAL
  Filled 2023-12-25 (×2): qty 1

## 2023-12-25 MED ORDER — PLECANATIDE 3 MG PO TABS: 3.0000 mg | ORAL_TABLET | Freq: Every day | ORAL | Status: DC

## 2023-12-25 MED ORDER — ATORVASTATIN CALCIUM 40 MG PO TABS
40.0000 mg | ORAL_TABLET | Freq: Every day | ORAL | Status: DC
  Administered 2023-12-26: 40 mg via ORAL
  Filled 2023-12-25: qty 1

## 2023-12-25 MED ORDER — SODIUM CHLORIDE 0.9 % IV SOLN: INTRAVENOUS | Status: AC

## 2023-12-25 MED ORDER — CHLORTHALIDONE 25 MG PO TABS
25.0000 mg | ORAL_TABLET | Freq: Every day | ORAL | Status: DC
  Administered 2023-12-26: 25 mg via ORAL
  Filled 2023-12-25: qty 1

## 2023-12-25 MED ORDER — HYDRALAZINE HCL 20 MG/ML IJ SOLN
5.0000 mg | INTRAMUSCULAR | Status: DC | PRN
  Administered 2023-12-25: 5 mg via INTRAVENOUS
  Filled 2023-12-25: qty 1

## 2023-12-25 MED ORDER — HYDRALAZINE HCL 20 MG/ML IJ SOLN: 10.0000 mg | Freq: Once | INTRAMUSCULAR | Status: DC

## 2023-12-25 NOTE — Patient Instructions (Signed)
 Medication Instructions:  STOP Amiodarone  STOP Aspirin  325 mg  START Aspirin  81 mg daily  START Chlorthalidone  25 mg daily   Labwork: None today  Testing/Procedures: None today  Follow-Up: 10 months  Any Other Special Instructions Will Be Listed Below (If Applicable).  If you need a refill on your cardiac medications before your next appointment, please call your pharmacy.

## 2023-12-25 NOTE — Progress Notes (Addendum)
 Cardiology Office Note  Date: 12/25/2023   ID: Stephen Koch, DOB 08/26/1960, MRN [id number]  PCP:  Leigh Lung, MD  Cardiologist:  Diannah SHAUNNA Maywood, MD Electrophysiologist:  None   History of Present Illness: Stephen Koch is a 63 y.o. male   Echo 12/2022 showed normal LVEF, severe LVH, G1 DD, severe AS, mild to moderate ar, borderline dilatation of the aortic root, 39 mm (4.4 cm per CT imaging in 06/2022).  He underwent amyloid screening (due to severe aortic valve stenosis, LVH) that did not show any evidence of cardiac amyloidosis. Cardiac MRI showed at least moderate aortic valve stenosis with aortic regurgitation.  He reports having DOE with household chores and was referred to structural heart clinic.  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  He did not undergo ascending aorta repair as it was noted to be 39 to 41 mm by TEE and felt not to require any replacement at the time of aortic valve replacement.  He had postop A-fib for which she was started on amiodarone  by CT surgery.  Currently on aspirin  325 mg once daily.  Going to cardiac rehab.  Doing well.  He is here today for follow-up visit.  Does not have any symptoms of angina or DOE.  No dizziness, palpitations, syncope, leg swelling.  Overall doing great.  Does not check blood pressures at home but whenever he goes to cardiac rehab, he is usually around 130 to 140 mm Hg SBP.   Past Medical History:  Diagnosis Date   Arthritis    Chronic back pain    Forklift injury   Essential hypertension, benign    GERD (gastroesophageal reflux disease)    Heart murmur    History of medication noncompliance     Past Surgical History:  Procedure Laterality Date   ABDOMINAL AORTOGRAM N/A 06/18/2023   Procedure: ABDOMINAL AORTOGRAM;  Surgeon: Thukkani, Arun K, MD;  Location: [facility] INVASIVE [facility] LAB;  Service: Cardiovascular;  Laterality: N/A;   AORTIC VALVE REPLACEMENT N/A 08/20/2023   Procedure:  REPLACEMENT, AORTIC VALVE, OPEN USING INSPIRIS RESILIA AORTIC VALVE;  Surgeon: Maryjane Mt, MD;  Location: [facility] OR;  Service: Open Heart Surgery;  Laterality: N/A;   BIOPSY  01/04/2019   Procedure: BIOPSY;  Surgeon: Harvey Margo LITTIE, MD;  Location: [facility];  Service: Endoscopy;;  gastric   COLONOSCOPY N/A 07/06/2012   DOQ:Wnmfjo mucosa in the terminal ileum/Moderate sized internal hemorrhoids   COLONOSCOPY WITH PROPOFOL  N/A 01/04/2019   normal TI, internal hemorrhoids Grade 3 s/p band placement X 3.    ESOPHAGOGASTRODUODENOSCOPY N/A 04/08/2013   DOQ:dfjoo gastric ulcer/duodenal inflammation. bx with chronic active gastritis with focal intestinal metaplasia, ulceration and H pylori   ESOPHAGOGASTRODUODENOSCOPY (EGD) WITH PROPOFOL  N/A 01/04/2019   moderate gastritis s/p biopsy, negative Hpylori   HEMORRHOID BANDING N/A 01/04/2019   Procedure: HEMORRHOID BANDING;  Surgeon: Harvey Margo LITTIE, MD;  Location: [facility];  Service: Endoscopy;  Laterality: N/A;   Left knee surgery Left    arthroscopy   QUADRICEPS TENDON REPAIR Left 03/05/2017   Procedure: REPAIR QUADRICEP TENDON;  Surgeon: Margrette Taft BRAVO, MD;  Location: [facility] ORS;  Service: Orthopedics;  Laterality: Left;   Right arm surgery     tendon repair   RIGHT HEART CATH AND CORONARY ANGIOGRAPHY N/A 06/18/2023   Procedure: RIGHT HEART CATH AND CORONARY ANGIOGRAPHY;  Surgeon: Wendel Lurena POUR, MD;  Location: [facility];  Service: Cardiovascular;  Laterality: N/A;  TEE WITHOUT CARDIOVERSION N/A 08/20/2023   Procedure: ECHOCARDIOGRAM, TRANSESOPHAGEAL;  Surgeon: Maryjane Mt, MD;  Location: [facility] OR;  Service: Open Heart Surgery;  Laterality: N/A;    Current Outpatient Medications  Medication Sig Dispense Refill   amiodarone  (PACERONE ) 200 MG tablet Take 200 mg by mouth two times daily for 10 days;then take 200 mg by mouth daily thereafter 60 tablet 1   amLODipine  (NORVASC ) 10 MG tablet Take 10 mg by mouth daily.     aspirin  EC 325 MG  tablet Take 1 tablet (325 mg total) by mouth daily. 100 tablet 0   atorvastatin  (LIPITOR) 40 MG tablet Take 40 mg by mouth daily.     gabapentin  (NEURONTIN ) 300 MG capsule Take 300 mg by mouth 2 (two) times daily.      hydrocortisone  (ANUSOL -HC) 2.5 % rectal cream Place 1 Application rectally 2 (two) times daily. As needed 30 g 0   methocarbamol  (ROBAXIN ) 500 MG tablet Take 500 mg by mouth 2 (two) times daily.     metoprolol  tartrate (LOPRESSOR ) 25 MG tablet Take 1 tablet (25 mg total) by mouth 2 (two) times daily. 60 tablet 1   Multiple Vitamin (MULTIVITAMIN WITH MINERALS) TABS tablet Take 1 tablet by mouth daily.     naproxen (NAPROSYN) 500 MG tablet Take 500 mg by mouth 2 (two) times daily as needed for moderate pain (pain score 4-6).     omeprazole  (PRILOSEC) 20 MG capsule TAKE 1 CAPSULE BY MOUTH DAILY BEFORE BREAKFAST. MAY TAKE ADDITIONAL CAPSULE BEFORE SUPPER IF NEEDED 180 capsule 3   Plecanatide  (TRULANCE ) 3 MG TABS Take 1 tablet (3 mg total) by mouth daily. 30 tablet 11   tamsulosin  (FLOMAX ) 0.4 MG CAPS capsule Take 0.4 mg by mouth daily.     No current facility-administered medications for this visit.   Allergies:  Ibuprofen    Social History: The patient  reports that he has been smoking cigars. He has never used smokeless tobacco. He reports current alcohol  use of about 3.0 - 5.0 standard drinks of alcohol  per week. He reports current drug use. Drug: Marijuana.   Family History: The patient's family history includes Heart disease in his father.   ROS:  Please see the history of present illness. Otherwise, complete review of systems is positive for none.  All other systems are reviewed and negative.   Physical Exam: VS:  BP (!) 140/72   Pulse 63   Wt 211 lb (95.7 kg)   SpO2 99%   BMI 27.09 kg/m , BMI Body mass index is 27.09 kg/m.  Wt Readings from Last 3 Encounters:  12/25/23 211 lb (95.7 kg)  12/25/23 212 lb 1.6 oz (96.2 kg)  10/08/23 184 lb 11.9 oz (83.8 kg)     General: Patient appears comfortable at rest. HEENT: Conjunctiva and lids normal, oropharynx clear with moist mucosa. Neck: Supple, no elevated JVP or carotid bruits, no thyromegaly. Lungs: Clear to auscultation, nonlabored breathing at rest. Cardiac: Regular rate and rhythm, no S3 or significant systolic murmur, no pericardial rub. Abdomen: Soft, nontender, no hepatomegaly, bowel sounds present, no guarding or rebound. Extremities: No pitting edema, distal pulses 2+. Skin: Warm and dry. Musculoskeletal: No kyphosis. Neuropsychiatric: Alert and oriented x3, affect grossly appropriate.  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 - Echocardiogram in June 2025 showed normal aortic prosthesis with mean PG 12 mmHg.  Repeat surveillance echocardiogram in 5 years or sooner if any new symptoms. - Dental cleanings twice per year. - SBE prophylaxis prior to dental procedures.  Ascending aortic aneurysm 4.3 cm in 2025 - 3.9 to 4.1 cm on TEE in April 25.  Repeat CTA chest/aorta in 2026.  HTN, poorly controlled - Does not check blood pressures at home.  Usually in cardiac rehab it is more than 130 mmHg SBP.  Between 130 and 140, it ranges. - Start chlorthalidone  25 mg once daily.  Discussed side effects. - Continue amlodipine  10 mg once daily. - Continue metoprolol  at rate of 5 mg twice daily.  Postop atrial fibrillation - Patient was started on amiodarone  in the postop setting in April 25.  Not on [facility] - EKG in May 2025 showed sinus bradycardia, HR 59 bpm. - Event monitor  in June 2025 did not reveal any atrial arrhythmias. - Stop amiodarone .  No indication for [facility] as there are no recurrences.   15 minutes spent in reviewing prior records, imaging, test, more than 3 labs, 15 minutes spent with the patient discussing the above problems and 10 minutes spent in documentation.   Medication Adjustments/Labs and Tests Ordered: Current medicines are reviewed at length with the patient today.  Concerns regarding medicines are outlined above.    Disposition:  Follow up 10 months  Signed, Troy Hartzog Arleta Maywood, MD, 12/25/2023 10:46 AM    [facility] at [facility] [address] [place], [place] [address]

## 2023-12-25 NOTE — Patient Instructions (Signed)
 Discharge Patient Instructions  Patient Details  Name: Stephen Koch MRN: [id number] Date of Birth: 01-24-1961 Referring Provider:  Leigh Lung, MD   Number of Visits: 8  Reason for Discharge:  Patient reached a stable level of exercise. Patient independent in their exercise. Patient has met program and personal goals.  Smoking History:  Social History   Tobacco Use  Smoking Status Every Day   Types: Cigars  Smokeless Tobacco Never  Tobacco Comments   smokes about 1 black and milds per day     Diagnosis:  S/P aortic valve replacement with bioprosthetic valve  Initial Exercise Prescription:  Initial Exercise Prescription - 10/08/23 1500       Date of Initial Exercise RX and Referring Provider   Date 10/08/23    Referring Provider Wendel Haws MD      Treadmill   MPH 0.8    Grade 0    Minutes 15    METs 1.7      NuStep   Level 3    SPM 60    Minutes 15    METs 2      Prescription Details   Frequency (times per week) 3    Duration Progress to 30 minutes of continuous aerobic without signs/symptoms of physical distress      Intensity   THRR 40-80% of Max Heartrate 93-134    Ratings of Perceived Exertion 11-13    Perceived Dyspnea 0-4      Resistance Training   Training Prescription Yes    Weight 4    Reps 10-15          Discharge Exercise Prescription (Final Exercise Prescription Changes):  Exercise Prescription Changes - 12/11/23 0800       Response to Exercise   Blood Pressure (Admit) 122/56    Blood Pressure (Exit) 132/74    Heart Rate (Admit) 69 bpm    Heart Rate (Exercise) 104 bpm    Heart Rate (Exit) 77 bpm    Rating of Perceived Exertion (Exercise) 15    Duration Continue with 30 min of aerobic exercise without signs/symptoms of physical distress.    Intensity THRR unchanged      Progression   Progression Continue to progress workloads to maintain intensity without signs/symptoms of physical distress.      Resistance  Training   Training Prescription Yes    Weight 4    Reps 10-15      NuStep   Level 5    SPM 94    Minutes 15    METs 2.4      Recumbant Elliptical   Level 6    RPM 51    Minutes 15    METs 4.5          Functional Capacity:  6 Minute Walk     Row Name 10/08/23 1515 12/25/23 0930       6 Minute Walk   Phase Initial Discharge    Distance 900 feet 880 feet    Distance % Change -- -2.2 %    Distance Feet Change -- 20 ft    Walk Time 6 minutes 6 minutes    # of Rest Breaks 0 0    MPH 1.7 1.67    METS 2.62 2.48    RPE 13 15    Perceived Dyspnea  1 --    VO2 Peak 9.16 8.69    Symptoms No Yes (comment)    Comments -- chronic leg pain 8/10 (has worsened throughout program,  now using rollator)    Resting HR 51 bpm 72 bpm    Resting BP 150/100 122/60    Resting Oxygen Saturation  97 % --    Exercise Oxygen Saturation  during 6 min walk 97 % --    Max Ex. HR 63 bpm 91 bpm    Max Ex. BP 158/90 132/64    2 Minute Post BP 148/90 --       Nutrition & Weight - Outcomes:  Pre Biometrics - 10/08/23 1520       Pre Biometrics   Height 6' 2 (1.88 m)    Weight 83.8 kg    Waist Circumference 39 inches    Hip Circumference 40 inches    Waist to Hip Ratio 0.98 %    BMI (Calculated) 23.71    Grip Strength 40.7 kg    Single Leg Stand 2.32 seconds          Post Biometrics - 12/25/23 0932        Post  Biometrics   Height 6' 2 (1.88 m)    Weight 96.2 kg    Waist Circumference 39 inches    Hip Circumference 41 inches    Waist to Hip Ratio 0.95 %    BMI (Calculated) 27.22    Grip Strength 46.8 kg    Single Leg Stand 4.6 seconds

## 2023-12-25 NOTE — ED Triage Notes (Signed)
 Pt comes in by EMS for unresponsiveness and vomiting. Pt was outside drinking a beer (16 oz.) and smoking weed. Pt's family witness the pt becoming unresponsive in a chair for 5 sec. Then threw up. By the time EMS arrived pt was A&Ox4. Pt does admit he felt funny right before passing out.   Pt was at his cardiologist appointment this morning for a follow up. They did change his medications but pt has not taken the new meds.    Pt is currently alert and oriented to person, & place. Disoriented to time,

## 2023-12-25 NOTE — H&P (Signed)
 [facility] H&P   Patient Demographics:    Stephen Koch, is a 63 y.o. male  MRN: [id number]   DOB - Jul 16, 1960  Admit Date - 12/25/2023  Outpatient Primary MD for the patient is Leigh Lung, MD  Referring MD/NP/PA: PA Rigney  Outpatient Specialists: cardiology Dr  Stacia  Patient coming from: home  Chief Complaint  Patient presents with   Dizziness   Emesis      HPI:    Stephen Koch  is a 63 y.o. male, with past medical history of THC use, tobacco abuse, alcohol  use, history of severe aortic stenosis, status post surgical valve replacement April 2025 in [place], hypertension, hyperlipidemia and BPH, patient was seen by his cardiologist today for routine checkup, he was prescribed chlorthalidone  for uncontrolled blood pressure, but he did not start taking it yet. - Presents to ED secondary to syncope, had syncope this afternoon, reports he was sitting under a tree, with his sister, he was smoking weed (he reports is new patch, and she just finished smoking a cigarette) as well he was drinking beer, says on average he drinks 1-2 beers per day, where apparently he had syncope, he denies chest pain, shortness of breath or palpitation, but he does report he has been feeling dizzy, nauseated and lightheaded and he did vomit before that event. - In ED CT head with no acute findings, no significant lab abnormalities, patient was not orthostatic, but given his cardiac history.  Hospitalist consulted to admit for observation.    Review of systems:     A full 10 point Review of Systems was done, except as stated above, all other Review of Systems were negative.   With Past History of the following :    Past Medical History:  Diagnosis Date   Arthritis    Chronic back pain    Forklift injury   Essential hypertension, benign    GERD (gastroesophageal reflux disease)    Heart  murmur    History of medication noncompliance       Past Surgical History:  Procedure Laterality Date   ABDOMINAL AORTOGRAM N/A 06/18/2023   Procedure: ABDOMINAL AORTOGRAM;  Surgeon: Thukkani, Arun K, MD;  Location: [facility];  Service: Cardiovascular;  Laterality: N/A;   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    ESOPHAGOGASTRODUODENOSCOPY N/A 04/08/2013   DOQ:dfjoo gastric ulcer/duodenal inflammation. bx  with chronic active gastritis with focal intestinal metaplasia, ulceration and H pylori   ESOPHAGOGASTRODUODENOSCOPY (EGD) WITH PROPOFOL  N/A 01/04/2019   moderate gastritis s/p biopsy, negative Hpylori   HEMORRHOID BANDING N/A 01/04/2019   Procedure: HEMORRHOID BANDING;  Surgeon: Harvey Margo CROME, MD;  Location: [facility];  Service: Endoscopy;  Laterality: N/A;   Left knee surgery Left    arthroscopy   QUADRICEPS TENDON REPAIR Left 03/05/2017   Procedure: REPAIR QUADRICEP TENDON;  Surgeon: Margrette Taft BRAVO, MD;  Location: [facility];  Service: Orthopedics;  Laterality: Left;   Right arm surgery     tendon repair   RIGHT HEART CATH AND CORONARY ANGIOGRAPHY N/A 06/18/2023   Procedure: RIGHT HEART CATH AND CORONARY ANGIOGRAPHY;  Surgeon: Wendel Lurena POUR, MD;  Location: [facility];  Service: Cardiovascular;  Laterality: N/A;   TEE WITHOUT CARDIOVERSION N/A 08/20/2023   Procedure: ECHOCARDIOGRAM, TRANSESOPHAGEAL;  Surgeon: Maryjane Mt, MD;  Location: [facility] OR;   Service: Open Heart Surgery;  Laterality: N/A;      Social History:     Social History   Tobacco Use   Smoking status: Every Day    Types: Cigars   Smokeless tobacco: Never   Tobacco comments:    smokes about 1 black and milds per day   Substance Use Topics   Alcohol  use: Yes    Alcohol /week: 3.0 - 5.0 standard drinks of alcohol     Types: 3 - 5 Cans of beer per week        Family History :     Family History  Problem Relation Age of Onset   Heart disease Father    Colon cancer Neg Hx    Colon polyps Neg Hx      Home Medications:   Prior to Admission medications   Medication Sig Start Date End Date Taking? Authorizing Provider  amLODipine  (NORVASC ) 10 MG tablet Take 10 mg by mouth daily. 09/03/23  Yes [provider]  atorvastatin  (LIPITOR) 40 MG tablet Take 40 mg by mouth daily. 12/08/22  Yes [provider]  chlorthalidone  (HYGROTON ) 25 MG tablet Take 1 tablet (25 mg total) by mouth daily. 12/25/23 03/24/24 Yes Mallipeddi, Vishnu P, MD  gabapentin  (NEURONTIN ) 300 MG capsule Take 300 mg by mouth 2 (two) times daily.  05/11/19  Yes [provider]  methocarbamol  (ROBAXIN ) 500 MG tablet Take 500 mg by mouth 2 (two) times daily. 04/14/19  Yes [provider]  Multiple Vitamin (MULTIVITAMIN WITH MINERALS) TABS tablet Take 1 tablet by mouth daily.   Yes [provider]  naproxen (NAPROSYN) 500 MG tablet Take 500 mg by mouth 2 (two) times daily as needed for moderate pain (pain score 4-6). 06/08/23  Yes [provider]  omeprazole  (PRILOSEC) 20 MG capsule TAKE 1 CAPSULE BY MOUTH DAILY BEFORE BREAKFAST. MAY TAKE ADDITIONAL CAPSULE BEFORE SUPPER IF NEEDED Patient taking differently: Take 20 mg by mouth daily. *May take one additional capsule at bedtime as needed for acid reflux 11/05/23  Yes Ezzard Sonny RAMAN, PA-C  Plecanatide  (TRULANCE ) 3 MG TABS Take 1 tablet (3 mg total) by mouth daily. 10/07/23  Yes Ezzard Sonny RAMAN, PA-C   tamsulosin  (FLOMAX ) 0.4 MG CAPS capsule Take 0.4 mg by mouth daily. 11/11/21  Yes [provider]     Allergies:     Allergies  Allergen Reactions   Ibuprofen  Other (See Comments)    Causes gastric bleeding     Physical Exam:   Vitals  Blood pressure (!) 166/105,  pulse (!) 59, temperature 97.8 F (36.6 C), temperature source Oral, resp. rate 18, height 6' 2 (1.88 m), weight 96.2 kg, SpO2 94%.   1. General Alert male, laying in bed, no apparent distress  2. Normal affect and insight, Not Suicidal or Homicidal, Awake Alert, Oriented X 3.  3. No F.N deficits, ALL C.Nerves Intact, Strength 5/5 all 4 extremities, Sensation intact all 4 extremities, Plantars down going.  4. Ears and Eyes appear Normal, Conjunctivae clear, PERRLA. Moist Oral Mucosa.  5. Supple Neck, No JVD, No cervical lymphadenopathy appriciated, No Carotid Bruits.  6. Symmetrical Chest wall movement, Good air movement bilaterally, CTAB.  7. RRR, No Gallops, Rubs or Murmurs, No Parasternal Heave.  8. Positive Bowel Sounds, Abdomen Soft, No tenderness, No organomegaly appriciated,No rebound -guarding or rigidity.  9.  No Cyanosis, Normal Skin Turgor, No Skin Rash or Bruise.  10. Good muscle tone,  joints appear normal , no effusions, Normal ROM.    Data Review:    CBC Recent Labs  Lab 12/25/23 1447  WBC 5.3  HGB 13.3  HCT 41.6  PLT 210  MCV 81.1  MCH 25.9*  MCHC 32.0  RDW 20.4*  LYMPHSABS 1.5  MONOABS 0.9  EOSABS 0.1  BASOSABS 0.0   ------------------------------------------------------------------------------------------------------------------  Chemistries  Recent Labs  Lab 12/25/23 1447  NA 139  K 3.5  CL 101  CO2 26  GLUCOSE 95  BUN 15  CREATININE 1.06  CALCIUM  9.1  AST 20  ALT 18  ALKPHOS 62  BILITOT 0.8   ------------------------------------------------------------------------------------------------------------------ estimated creatinine clearance is 80.8  mL/min (by C-G formula based on SCr of 1.06 mg/dL). ------------------------------------------------------------------------------------------------------------------ No results for input(s): TSH, T4TOTAL, T3FREE, THYROIDAB in the last 72 hours.  Invalid input(s): FREET3  Coagulation profile No results for input(s): INR, PROTIME in the last 168 hours. ------------------------------------------------------------------------------------------------------------------- No results for input(s): DDIMER in the last 72 hours. -------------------------------------------------------------------------------------------------------------------  Cardiac Enzymes No results for input(s): CKMB, TROPONINI, MYOGLOBIN in the last 168 hours.  Invalid input(s): CK ------------------------------------------------------------------------------------------------------------------    Component Value Date/Time   BNP 130.0 (H) 05/25/2023 1549     ---------------------------------------------------------------------------------------------------------------  Urinalysis    Component Value Date/Time   COLORURINE YELLOW 12/25/2023 1655   APPEARANCEUR CLEAR 12/25/2023 1655   LABSPEC 1.008 12/25/2023 1655   PHURINE 6.0 12/25/2023 1655   GLUCOSEU NEGATIVE 12/25/2023 1655   HGBUR SMALL (A) 12/25/2023 1655   BILIRUBINUR NEGATIVE 12/25/2023 1655   KETONESUR NEGATIVE 12/25/2023 1655   PROTEINUR NEGATIVE 12/25/2023 1655   UROBILINOGEN 0.2 11/01/2009 1537   NITRITE NEGATIVE 12/25/2023 1655   LEUKOCYTESUR NEGATIVE 12/25/2023 1655    ----------------------------------------------------------------------------------------------------------------   Imaging Results:    CT Head Wo Contrast Result Date: 12/25/2023 CLINICAL DATA:  Syncope/presyncope, cerebrovascular cause suspected EXAM: CT HEAD WITHOUT CONTRAST TECHNIQUE: Contiguous axial images were obtained from the base of the skull  through the vertex without intravenous contrast. RADIATION DOSE REDUCTION: This exam was performed according to the departmental dose-optimization program which includes automated exposure control, adjustment of the mA and/or kV according to patient size and/or use of iterative reconstruction technique. COMPARISON:  Remote head CT 11/11/2010 FINDINGS: Brain: No intracranial hemorrhage, mass effect, or midline shift. No hydrocephalus. Minimal bilateral basal gangliar mineralization, likely senescent. The basilar cisterns are patent. No evidence of territorial infarct or acute ischemia. No extra-axial or intracranial fluid collection. Vascular: Atherosclerosis of skullbase vasculature without hyperdense vessel or abnormal calcification. Skull: No fracture or focal lesion. Sinuses/Orbits: Paranasal sinuses and mastoid air cells are clear. The visualized orbits  are unremarkable. Other: None. IMPRESSION: No acute intracranial abnormality. Electronically Signed   By: Andrea Gasman M.D.   On: 12/25/2023 18:03   DG Chest Port 1 View Result Date: 12/25/2023 CLINICAL DATA:  Syncope EXAM: PORTABLE CHEST - 1 VIEW COMPARISON:  09/29/2023 FINDINGS: Unchanged mild cardiomegaly. No pulmonary vascular congestion. Median sternotomy changes again seen. Lungs are clear. IMPRESSION: Unchanged mild cardiomegaly. Electronically Signed   By: Aliene Lloyd M.D.   On: 12/25/2023 15:13    EKG:  PR interval 186 ms QRS duration 124 ms QT/QTcB 452/443 ms P-R-T axes 68 19 236 Sinus bradycardia Left ventricular hypertrophy with QRS widening and repolarization abnormality ( R in aVL , Sokolow-Lyon , Romhilt-Estes ) Inferior infarct , age undetermined Abnormal ECG When compared with ECG of 16-Sep-2023 14:47, Inferior infarct is now Present T wave inversion no longer evident in Anterior leads  Assessment & Plan:    Principal Problem:   Syncope Active Problems:   ETOH abuse   GERD (gastroesophageal reflux disease)   S/P  aortic valve replacement with bioprosthetic valve   Severe aortic stenosis    Syncope - This is most likely vasovagal syncope, in the setting of his vomiting, as well due to alcohol  use with THC use as he endorses drinking 1-2 beers per day, and using new patch with which he just finished before his syncope. - Will monitor on telemetry overnight to ensure no bradycardia, or malignant arrhythmias or heart block given his recent aortic valve replacement surgery in April. - Continue gentle hydration overnight. 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  Mentation per cardiology to repeat CTA chest/aorta in 2026.  BPH -Continue with tamsulosin    HTN, poorly controlled - Blood pressure is elevated, he was started on chlorthalidone  by cardiology today but he did not start taking it yet.  -Continue chlorthalidone  25 mg once daily.   - Continue amlodipine  10 mg once daily. - Does appear patient not anymore on metoprolol , as discussed with pharmacy, last time it was dispensed in April for 30 days no refills, him and wife denies him still using it.    DVT Prophylaxis Heparin    AM Labs Ordered, also please review Full Orders  Family Communication: Admission, patients condition and plan of care including tests being ordered have been discussed with the patient and wife at bedside who indicate understanding and agree with the plan and Code Status.  Code Status full code  Likely DC to home  Consults called: None  Admission status: Observation  Time spent in minutes : 70 minutes   Brayton Lye M.D on 12/25/2023 at 7:46 PM   [facility] - Office  [phone]

## 2023-12-25 NOTE — Progress Notes (Signed)
 Daily Session Note  Patient Details  Name: Stephen Koch MRN: [id number] Date of Birth: 12-16-1960 Referring Provider:   Flowsheet Row CARDIAC REHAB PHASE II ORIENTATION from 10/08/2023 in [facility]  Referring Provider Wendel Haws MD    Encounter Date: 12/25/2023  Check In:  Session Check In - 12/25/23 0930       Check-In   Supervising physician immediately available to respond to emergencies See telemetry face sheet for immediately available MD    Location [facility]    Staff Present Ronal Idell Glen, RN, BSN, Randie Gelineau, MA, RCEP, CCRP, CCET;Rachel Tallaboa, RN, BSN    Virtual Visit No    Medication changes reported     No    Fall or balance concerns reported    No    Warm-up and Cool-down Performed on first and last piece of equipment    Resistance Training Performed Yes    VAD Patient? No    PAD/SET Patient? No      Pain Assessment   Currently in Pain? No/denies          Capillary Blood Glucose: No results found for this or any previous visit (from the past 24 hours).    Social History   Tobacco Use  Smoking Status Every Day   Types: Cigars  Smokeless Tobacco Never  Tobacco Comments   smokes about 1 black and milds per day     Goals Met:  Independence with exercise equipment Exercise tolerated well No report of concerns or symptoms today Strength training completed today  Goals Unmet:  Not Applicable  Comments: Pt able to follow exercise prescription today without complaint.  Will continue to monitor for progression.   6 Minute Walk     Row Name 10/08/23 1515 12/25/23 0930       6 Minute Walk   Phase Initial Discharge    Distance 900 feet 880 feet    Distance % Change -- -2.2 %    Distance Feet Change -- 20 ft    Walk Time 6 minutes 6 minutes    # of Rest Breaks 0 0    MPH 1.7 1.67    METS 2.62 2.48    RPE 13 15    Perceived Dyspnea  1 --    VO2 Peak 9.16 8.69    Symptoms No Yes  (comment)    Comments -- chronic leg pain 8/10 (has worsened throughout program, now using rollator)    Resting HR 51 bpm 72 bpm    Resting BP 150/100 122/60    Resting Oxygen Saturation  97 % --    Exercise Oxygen Saturation  during 6 min walk 97 % --    Max Ex. HR 63 bpm 91 bpm    Max Ex. BP 158/90 132/64    2 Minute Post BP 148/90 --

## 2023-12-25 NOTE — ED Provider Notes (Signed)
 [place] EMERGENCY DEPARTMENT AT [facility] Provider Note   CSN: [id number] Arrival date & time: 12/25/23  1402     Patient presents with: Dizziness and Emesis   Stephen Koch is a 63 y.o. male.   Patient is a 63 year old male who presents to the emergency department secondary to a syncopal event which occurred just prior to arrival.  He did follow-up with his cardiologist this morning and did have some medication changes but notes that he has not taken any of these as of yet.  He notes that he did not have any preceding chest pain, shortness of breath, palpitations but notes that he had a generalized uneasy feeling.  Patient notes that he does not remember the event.  On presentation he admits to generalized weakness.  EMS notes that the patient was drinking alcohol  and smoking weed when they initially picked him up.  Patient does have active smell of alcohol  on exam.  He denies any active dizziness, lightheadedness at this point.  He denies any abnormal headache.  He denies any abdominal pain.  Patient did vomit after passing out.   Dizziness Associated symptoms: vomiting   Emesis      Prior to Admission medications   Medication Sig Start Date End Date Taking? Authorizing Provider  amLODipine  (NORVASC ) 10 MG tablet Take 10 mg by mouth daily. 09/03/23   [provider]  aspirin  EC 81 MG tablet Take 81 mg by mouth daily. Swallow whole.    [provider]  atorvastatin  (LIPITOR) 40 MG tablet Take 40 mg by mouth daily. 12/08/22   [provider]  chlorthalidone  (HYGROTON ) 25 MG tablet Take 1 tablet (25 mg total) by mouth daily. 12/25/23 03/24/24  Mallipeddi, Vishnu P, MD  gabapentin  (NEURONTIN ) 300 MG capsule Take 300 mg by mouth 2 (two) times daily.  05/11/19   [provider]  hydrocortisone  (ANUSOL -HC) 2.5 % rectal cream Place 1 Application rectally 2 (two) times daily. As needed 10/07/23   Ezzard Sonny RAMAN, PA-C  methocarbamol  (ROBAXIN )  500 MG tablet Take 500 mg by mouth 2 (two) times daily. 04/14/19   [provider]  metoprolol  tartrate (LOPRESSOR ) 25 MG tablet Take 1 tablet (25 mg total) by mouth 2 (two) times daily. 08/25/23   Zimmerman, Donielle M, PA-C  Multiple Vitamin (MULTIVITAMIN WITH MINERALS) TABS tablet Take 1 tablet by mouth daily.    [provider]  naproxen (NAPROSYN) 500 MG tablet Take 500 mg by mouth 2 (two) times daily as needed for moderate pain (pain score 4-6). 06/08/23   [provider]  omeprazole  (PRILOSEC) 20 MG capsule TAKE 1 CAPSULE BY MOUTH DAILY BEFORE BREAKFAST. MAY TAKE ADDITIONAL CAPSULE BEFORE SUPPER IF NEEDED 11/05/23   Ezzard Sonny RAMAN, PA-C  Plecanatide  (TRULANCE ) 3 MG TABS Take 1 tablet (3 mg total) by mouth daily. 10/07/23   Ezzard Sonny RAMAN, PA-C  tamsulosin  (FLOMAX ) 0.4 MG CAPS capsule Take 0.4 mg by mouth daily. 11/11/21   [provider]    Allergies: Ibuprofen     Review of Systems  Gastrointestinal:  Positive for vomiting.  Neurological:  Positive for dizziness.  All other systems reviewed and are negative.   Updated Vital Signs BP 139/83   Pulse (!) 58   Temp 97.8 F (36.6 C) (Oral)   Resp 18   Ht 6' 2 (1.88 m)   Wt 96.2 kg   SpO2 94%   BMI 27.22 kg/m   Physical Exam Vitals and nursing note reviewed.  Constitutional:  General: He is not in acute distress.    Appearance: Normal appearance. He is not ill-appearing.  HENT:     Head: Normocephalic and atraumatic.     Nose: Nose normal.     Mouth/Throat:     Mouth: Mucous membranes are moist.  Eyes:     Extraocular Movements: Extraocular movements intact.     Conjunctiva/sclera: Conjunctivae normal.     Pupils: Pupils are equal, round, and reactive to light.  Cardiovascular:     Rate and Rhythm: Normal rate and regular rhythm.     Pulses: Normal pulses.     Heart sounds: Normal heart sounds. No murmur heard.    No gallop.  Pulmonary:     Effort: Pulmonary effort is normal. No  respiratory distress.     Breath sounds: Normal breath sounds. No stridor. No wheezing, rhonchi or rales.  Abdominal:     General: Abdomen is flat. Bowel sounds are normal. There is no distension.     Palpations: Abdomen is soft.     Tenderness: There is no abdominal tenderness. There is no guarding.  Musculoskeletal:        General: Normal range of motion.     Cervical back: Normal range of motion and neck supple. No rigidity or tenderness.  Skin:    General: Skin is warm and dry.  Neurological:     General: No focal deficit present.     Mental Status: He is alert and oriented to person, place, and time. Mental status is at baseline.     Cranial Nerves: No cranial nerve deficit.     Sensory: No sensory deficit.     Motor: No weakness.     Coordination: Coordination normal.     Gait: Gait normal.     Comments: Normal finger-nose, normal rapid head movements, normal heel-to-shin, horizontal nystagmus noted  Psychiatric:        Mood and Affect: Mood normal.        Behavior: Behavior normal.        Thought Content: Thought content normal.        Judgment: Judgment normal.     (all labs ordered are listed, but only abnormal results are displayed) Labs Reviewed  CBC WITH DIFFERENTIAL/PLATELET  COMPREHENSIVE METABOLIC PANEL WITH GFR  URINALYSIS, ROUTINE W REFLEX MICROSCOPIC  ETHANOL  TROPONIN I (HIGH SENSITIVITY)    EKG: None  Radiology: No results found.   Procedures   Medications Ordered in the ED  sodium chloride  0.9 % bolus 1,000 mL (has no administration in time range)                                    Medical Decision Making Amount and/or Complexity of Data Reviewed Labs: ordered. Radiology: ordered.  Risk Decision regarding hospitalization.   This patient presents to the ED for concern of syncope, this involves an extensive number of treatment options, and is a complaint that carries with it a high risk of complications and morbidity.  The differential  diagnosis includes vasovagal syncope, CVA, TIA, arrhythmia, acute kidney injury, electrolyte derangement, intracranial hemorrhage   Co morbidities that complicate the patient evaluation  Previous aortic valve replacement   Additional history obtained:  Additional history obtained from medical records External records from outside source obtained and reviewed including medical records   Lab Tests:  I Ordered, and personally interpreted labs.  The pertinent results include: No leukocytosis, no anemia,  normal kidney function liver function, normal electrolytes, negative serial troponins, unremarkable urinalysis, negative alcohol  level   Imaging Studies ordered:  I ordered imaging studies including CT scan head, chest x-ray I independently visualized and interpreted imaging which showed no acute intracranial process, no acute cardiopulmonary process I agree with the radiologist interpretation   Cardiac Monitoring: / EKG:  The patient was maintained on a cardiac monitor.  I personally viewed and interpreted the cardiac monitored which showed an underlying rhythm of: Sinus bradycardia, nonspecific ST changes, no T wave changes, no STEMI   Consultations Obtained:  I requested consultation with the hospitalist,  and discussed lab and imaging findings as well as pertinent plan - they recommend: Admission   Problem List / ED Course / Critical interventions / Medication management  Patient is doing well at this time and does remain stable.  Patient notes his symptoms have greatly improved with treatment in the emergency department.  The exact cause of his syncope is unclear at this point.  Did discuss with patient we will plan for admission to the hospital service for observation at this point.  Blood work has been overall unremarkable.  He had no focal neurological deficits on exam and have low suspicion for CVA or TIA at this point.  Head CT was otherwise unremarkable with no indication  of acute intracranial hemorrhage.  Patient does note that he was evaluated by cardiology this morning and did have some changes made to his medications though he has not started taking these.  Patient has no obvious underlying indication for infectious source at this point.  Do not suspect meningitis or encephalitis.  Have discussed patient case with Dr. Sherlon with the hospitalist service who has excepted for admission. I ordered medication including IV fluids for syncope Reevaluation of the patient after these medicines showed that the patient improved I have reviewed the patients home medicines and have made adjustments as needed   Social Determinants of Health:  None   Test / Admission - Considered:  Admission     Final diagnoses:  None    ED Discharge Orders     None          Daralene Lonni JONETTA DEVONNA 12/25/23 1900    Charlyn Sora, MD 12/26/23 [phone]

## 2023-12-26 ENCOUNTER — Other Ambulatory Visit: Payer: Self-pay

## 2023-12-26 DIAGNOSIS — R55 Syncope and collapse: Secondary | ICD-10-CM | POA: Diagnosis not present

## 2023-12-26 LAB — CBC
HCT: 41.3 % (ref 39.0–52.0)
Hemoglobin: 13.4 g/dL (ref 13.0–17.0)
MCH: 25.6 pg — ABNORMAL LOW (ref 26.0–34.0)
MCHC: 32.4 g/dL (ref 30.0–36.0)
MCV: 79 fL — ABNORMAL LOW (ref 80.0–100.0)
Platelets: 214 K/uL (ref 150–400)
RBC: 5.23 MIL/uL (ref 4.22–5.81)
RDW: 20.5 % — ABNORMAL HIGH (ref 11.5–15.5)
WBC: 6 K/uL (ref 4.0–10.5)
nRBC: 0 % (ref 0.0–0.2)

## 2023-12-26 LAB — BASIC METABOLIC PANEL WITH GFR
Anion gap: 10 (ref 5–15)
BUN: 15 mg/dL (ref 8–23)
CO2: 26 mmol/L (ref 22–32)
Calcium: 8.9 mg/dL (ref 8.9–10.3)
Chloride: 105 mmol/L (ref 98–111)
Creatinine, Ser: 0.87 mg/dL (ref 0.61–1.24)
GFR, Estimated: >60 mL/min (ref >60)
Glucose, Bld: 100 mg/dL — ABNORMAL HIGH (ref 70–99)
Potassium: 3.5 mmol/L (ref 3.5–5.1)
Sodium: 141 mmol/L (ref 135–145)

## 2023-12-26 LAB — HIV ANTIBODY (ROUTINE TESTING W REFLEX): HIV Screen 4th Generation wRfx: NONREACTIVE

## 2023-12-26 MED ORDER — METOPROLOL TARTRATE 25 MG PO TABS: 25.0000 mg | ORAL_TABLET | Freq: Two times a day (BID) | ORAL | 1 refills | Status: AC

## 2023-12-26 MED ORDER — HYDRALAZINE HCL 20 MG/ML IJ SOLN
10.0000 mg | INTRAMUSCULAR | Status: DC | PRN
  Administered 2023-12-26: 10 mg via INTRAVENOUS
  Filled 2023-12-26: qty 1

## 2023-12-26 MED ORDER — METOPROLOL TARTRATE 25 MG PO TABS
25.0000 mg | ORAL_TABLET | Freq: Two times a day (BID) | ORAL | Status: DC
  Administered 2023-12-26: 25 mg via ORAL
  Filled 2023-12-26: qty 1

## 2023-12-26 NOTE — Progress Notes (Signed)
 Notified by CCMD that patient had 7 beat run of vtach. Patient reports no complaints. VS obtained, Dr. Maree made aware.

## 2023-12-26 NOTE — Progress Notes (Signed)
 Discharge instructions reviewed with patient, verbalized understanding. Patient informed that ZioPatch will be delivered to him, verbalized understanding. IV and tele removed. Patient to be transported to private vehicle via wheelchair when ride arrives.

## 2023-12-26 NOTE — Discharge Summary (Signed)
 Physician Discharge Summary  RITCHARD PARAGAS FMW:[id number] DOB: 1960-08-29 DOA: 12/25/2023  PCP: Leigh Lung, MD  Admit date: 12/25/2023  Discharge date: 12/26/2023  Admitted From:Home  Disposition:  Home  Recommendations for Outpatient Follow-up:  Follow up with PCP in 1-2 weeks Follow-up with cardiology as recommended and monitor blood pressures carefully Continue chlorthalidone , amlodipine , metoprolol  Zio patch for 2 weeks which will be mailed to patient to assess for any arrhythmias Continue other home medications as prior Counseled on cessation of THC use as well as tobacco and alcohol   Home Health: None  Equipment/Devices: None  Discharge Condition:Stable  CODE STATUS: Full  Diet recommendation: Heart Healthy  Brief/Interim Summary: Stephen Koch  is a 63 y.o. male, with past medical history of THC use, tobacco abuse, alcohol  use, history of severe aortic stenosis, status post surgical valve replacement April 2025 in [place], hypertension, hyperlipidemia and BPH, patient was seen by his cardiologist today for routine checkup, he was prescribed chlorthalidone  for uncontrolled blood pressure, but he did not start taking it yet. - Presents to ED secondary to syncope, had syncope this afternoon, reports he was sitting under a tree, with his sister, he was smoking weed (he reports is new patch, and she just finished smoking a cigarette) as well he was drinking beer, says on average he drinks 1-2 beers per day.  Patient underwent head CT in the ED with no acute findings and no significant lab abnormalities noted.  Patient was not noted to be orthostatic and was placed on observation given his cardiac history.  He was noted to have some uncontrolled hypertension, but this quickly improved with administration of his oral medications as well as a couple doses of IV hydralazine .  He is otherwise asymptomatic and is ambulating without any symptoms or concerns.  He will be discharged  with Zio patch for further monitoring of any arrhythmias.  No other acute events or concerns noted.  Discharge Diagnoses:  Principal Problem:   Syncope Active Problems:   ETOH abuse   GERD (gastroesophageal reflux disease)   S/P aortic valve replacement with bioprosthetic valve   Severe aortic stenosis  Principal discharge diagnosis: Syncope likely vasovagal.  Discharge Instructions  Discharge Instructions     Diet - low sodium heart healthy   Complete by: As directed    Increase activity slowly   Complete by: As directed       Allergies as of 12/26/2023       Reactions   Ibuprofen  Other (See Comments)   Causes gastric bleeding        Medication List     TAKE these medications    amLODipine  10 MG tablet Commonly known as: NORVASC  Take 10 mg by mouth daily.   atorvastatin  40 MG tablet Commonly known as: LIPITOR Take 40 mg by mouth daily.   chlorthalidone  25 MG tablet Commonly known as: HYGROTON  Take 1 tablet (25 mg total) by mouth daily.   gabapentin  300 MG capsule Commonly known as: NEURONTIN  Take 300 mg by mouth 2 (two) times daily.   methocarbamol  500 MG tablet Commonly known as: ROBAXIN  Take 500 mg by mouth 2 (two) times daily.   metoprolol  tartrate 25 MG tablet Commonly known as: LOPRESSOR  Take 1 tablet (25 mg total) by mouth 2 (two) times daily.   multivitamin with minerals Tabs tablet Take 1 tablet by mouth daily.   naproxen 500 MG tablet Commonly known as: NAPROSYN Take 500 mg by mouth 2 (two) times daily as needed for moderate pain (pain  score 4-6).   omeprazole  20 MG capsule Commonly known as: PRILOSEC TAKE 1 CAPSULE BY MOUTH DAILY BEFORE BREAKFAST. MAY TAKE ADDITIONAL CAPSULE BEFORE SUPPER IF NEEDED What changed: See the new instructions.   tamsulosin  0.4 MG Caps capsule Commonly known as: FLOMAX  Take 0.4 mg by mouth daily.   Trulance  3 MG Tabs Generic drug: Plecanatide  Take 1 tablet (3 mg total) by mouth daily.         Follow-up Information     Leigh Lung, MD. Schedule an appointment as soon as possible for a visit in 1 week(s).   Specialty: Family Medicine Contact information: [address] [place] [address] [phone]         Stacia Diannah SQUIBB, MD. Go to.   Specialties: Cardiology, Internal Medicine Contact information: [address] [place] [address] [phone]                Allergies  Allergen Reactions   Ibuprofen  Other (See Comments)    Causes gastric bleeding    Consultations: None   Procedures/Studies: CT Head Wo Contrast Result Date: 12/25/2023 CLINICAL DATA:  Syncope/presyncope, cerebrovascular cause suspected EXAM: CT HEAD WITHOUT CONTRAST TECHNIQUE: Contiguous axial images were obtained from the base of the skull through the vertex without intravenous contrast. RADIATION DOSE REDUCTION: This exam was performed according to the departmental dose-optimization program which includes automated exposure control, adjustment of the mA and/or kV according to patient size and/or use of iterative reconstruction technique. COMPARISON:  Remote head CT 11/11/2010 FINDINGS: Brain: No intracranial hemorrhage, mass effect, or midline shift. No hydrocephalus. Minimal bilateral basal gangliar mineralization, likely senescent. The basilar cisterns are patent. No evidence of territorial infarct or acute ischemia. No extra-axial or intracranial fluid collection. Vascular: Atherosclerosis of skullbase vasculature without hyperdense vessel or abnormal calcification. Skull: No fracture or focal lesion. Sinuses/Orbits: Paranasal sinuses and mastoid air cells are clear. The visualized orbits are unremarkable. Other: None. IMPRESSION: No acute intracranial abnormality. Electronically Signed   By: Andrea Gasman M.D.   On: 12/25/2023 18:03   DG Chest Port 1 View Result Date: 12/25/2023 CLINICAL DATA:  Syncope EXAM: PORTABLE CHEST - 1 VIEW COMPARISON:  09/29/2023 FINDINGS:  Unchanged mild cardiomegaly. No pulmonary vascular congestion. Median sternotomy changes again seen. Lungs are clear. IMPRESSION: Unchanged mild cardiomegaly. Electronically Signed   By: Aliene Lloyd M.D.   On: 12/25/2023 15:13     Discharge Exam: Vitals:   12/26/23 1030 12/26/23 1200  BP: (!) 174/104 (!) 147/99  Pulse: (!) 57 60  Resp:    Temp:    SpO2:     Vitals:   12/26/23 0905 12/26/23 1029 12/26/23 1030 12/26/23 1200  BP: (!) 196/97 (!) 167/100 (!) 174/104 (!) 147/99  Pulse: (!) 57 (!) 58 (!) 57 60  Resp:      Temp:      TempSrc:      SpO2:      Weight:      Height:        General: Pt is alert, awake, not in acute distress Cardiovascular: RRR, S1/S2 +, no rubs, no gallops Respiratory: CTA bilaterally, no wheezing, no rhonchi Abdominal: Soft, NT, ND, bowel sounds + Extremities: no edema, no cyanosis    The results of significant diagnostics from this hospitalization (including imaging, microbiology, ancillary and laboratory) are listed below for reference.     Microbiology: No results found for this or any previous visit (from the past 240 hours).   Labs: BNP (last 3  results) Recent Labs    01/20/23 1255 05/25/23 1549  BNP 85.0 130.0*   Basic Metabolic Panel: Recent Labs  Lab 12/25/23 1447 12/26/23 0452  NA 139 141  K 3.5 3.5  CL 101 105  CO2 26 26  GLUCOSE 95 100*  BUN 15 15  CREATININE 1.06 0.87  CALCIUM  9.1 8.9   Liver Function Tests: Recent Labs  Lab 12/25/23 1447  AST 20  ALT 18  ALKPHOS 62  BILITOT 0.8  PROT 7.1  ALBUMIN  3.7   No results for input(s): LIPASE, AMYLASE in the last 168 hours. No results for input(s): AMMONIA in the last 168 hours. CBC: Recent Labs  Lab 12/25/23 1447 12/26/23 0452  WBC 5.3 6.0  NEUTROABS 2.8  --   HGB 13.3 13.4  HCT 41.6 41.3  MCV 81.1 79.0*  PLT 210 214   Cardiac Enzymes: No results for input(s): CKTOTAL, CKMB, CKMBINDEX, TROPONINI in the last 168 hours. BNP: Invalid  input(s): POCBNP CBG: No results for input(s): GLUCAP in the last 168 hours. D-Dimer No results for input(s): DDIMER in the last 72 hours. Hgb A1c No results for input(s): HGBA1C in the last 72 hours. Lipid Profile No results for input(s): CHOL, HDL, LDLCALC, TRIG, CHOLHDL, LDLDIRECT in the last 72 hours. Thyroid function studies No results for input(s): TSH, T4TOTAL, T3FREE, THYROIDAB in the last 72 hours.  Invalid input(s): FREET3 Anemia work up No results for input(s): VITAMINB12, FOLATE, FERRITIN, TIBC, IRON, RETICCTPCT in the last 72 hours. Urinalysis    Component Value Date/Time   COLORURINE YELLOW 12/25/2023 1655   APPEARANCEUR CLEAR 12/25/2023 1655   LABSPEC 1.008 12/25/2023 1655   PHURINE 6.0 12/25/2023 1655   GLUCOSEU NEGATIVE 12/25/2023 1655   HGBUR SMALL (A) 12/25/2023 1655   BILIRUBINUR NEGATIVE 12/25/2023 1655   KETONESUR NEGATIVE 12/25/2023 1655   PROTEINUR NEGATIVE 12/25/2023 1655   UROBILINOGEN 0.2 11/01/2009 1537   NITRITE NEGATIVE 12/25/2023 1655   LEUKOCYTESUR NEGATIVE 12/25/2023 1655   Sepsis Labs Recent Labs  Lab 12/25/23 1447 12/26/23 0452  WBC 5.3 6.0   Microbiology No results found for this or any previous visit (from the past 240 hours).   Time coordinating discharge: 35 minutes  SIGNED:   Adron JONETTA Fairly, DO [facility] 12/26/2023, 12:18 PM  If 7PM-7AM, please contact night-coverage [email or web address]

## 2023-12-26 NOTE — Progress Notes (Signed)
 Cbg- 131. Did not cross over.

## 2023-12-26 NOTE — Plan of Care (Signed)

## 2023-12-29 ENCOUNTER — Telehealth: Payer: Self-pay | Admitting: *Deleted

## 2023-12-29 ENCOUNTER — Ambulatory Visit: Attending: Internal Medicine

## 2023-12-29 DIAGNOSIS — R55 Syncope and collapse: Secondary | ICD-10-CM

## 2023-12-29 LAB — GLUCOSE, CAPILLARY: Glucose-Capillary: 131 mg/dL — ABNORMAL HIGH (ref 70–99)

## 2023-12-29 NOTE — Telephone Encounter (Signed)
 Order placed

## 2023-12-29 NOTE — Telephone Encounter (Signed)
-----   Message from Adron JONETTA Fairly sent at 12/26/2023 12:04 PM EDT ----- Patient will need 2 week zio patch sent to him for syncope, thanks. F/u with Dr. Malipeddi.

## 2024-01-04 ENCOUNTER — Encounter (HOSPITAL_COMMUNITY)
Admission: RE | Admit: 2024-01-04 | Discharge: 2024-01-04 | Disposition: A | Discharge status: Home / Self Care | Source: Ambulatory Visit | Attending: Internal Medicine | Admitting: Internal Medicine

## 2024-01-04 DIAGNOSIS — Z953 Presence of xenogenic heart valve: Secondary | ICD-10-CM | POA: Insufficient documentation

## 2024-01-04 NOTE — Progress Notes (Signed)
 Daily Session Note  Patient Details  Name: NITISH ROES MRN: [id number] Date of Birth: [birth year] Referring Provider:   Flowsheet Row CARDIAC REHAB PHASE II ORIENTATION from 10/08/2023 in [facility] C[facility]AC [facility]  Referring Provider Wendel Haws MD    Encounter Date: 01/04/2024  Check In:  Session Check In - 01/04/24 0924       Check-In   Supervising physician immediately available to respond to emergencies See telemetry face sheet for immediately available MD    Location [facility]    Staff Present Harlene Gelineau, MA, RCEP, CCRP, CCET;Ivanna Kocak Jackquline, BSN, RN, WTA-C    Virtual Visit No    Medication changes reported     No    Fall or balance concerns reported    No    Tobacco Cessation No Change    Warm-up and Cool-down Performed on first and last piece of equipment    Resistance Training Performed Yes    VAD Patient? No    PAD/SET Patient? No      Pain Assessment   Currently in Pain? No/denies          Capillary Blood Glucose: No results found for this or any previous visit (from the past 24 hours).    Social History   Tobacco Use  Smoking Status Every Day   Types: Cigars  Smokeless Tobacco Never  Tobacco Comments   smokes about 1 black and milds per day     Goals Met:  Independence with exercise equipment Exercise tolerated well No report of concerns or symptoms today Strength training completed today  Goals Unmet:  Not Applicable  Comments: Pt able to follow exercise prescription today without complaint.  Will continue to monitor for progression.

## 2024-01-06 ENCOUNTER — Encounter (HOSPITAL_COMMUNITY): Payer: Self-pay | Admitting: *Deleted

## 2024-01-06 ENCOUNTER — Inpatient Hospital Stay (HOSPITAL_COMMUNITY)
Admission: RE | Admit: 2024-01-06 | Discharge: 2024-01-06 | Discharge status: Home / Self Care | Source: Ambulatory Visit | Attending: Internal Medicine

## 2024-01-06 DIAGNOSIS — Z953 Presence of xenogenic heart valve: Secondary | ICD-10-CM

## 2024-01-06 NOTE — Progress Notes (Signed)
 Cardiac Individual Treatment Plan  Patient Details  Name: Stephen Koch MRN: [id number] Date of Birth: [birth year] Referring Provider:   Flowsheet Row CARDIAC REHAB PHASE II ORIENTATION from 10/08/2023 in [facility] CARDIAC REHABILITATION  Referring Provider Wendel Haws MD    Initial Encounter Date:  Flowsheet Row CARDIAC REHAB PHASE II ORIENTATION from 10/08/2023 in [facility] CARDIAC REH[facility]ILITATION  Date 10/08/23    Visit Diagnosis: S/P aortic valve replacement with bioprosthetic valve  Patient's Home Medications on Admission:  Current Outpatient Medications:    amLODipine  (NORVASC ) 10 MG tablet, Take 10 mg by mouth daily., Disp: , Rfl:    atorvastatin  (LIPITOR) 40 MG tablet, Take 40 mg by mouth daily., Disp: , Rfl:    chlorthalidone  (HYGROTON ) 25 MG tablet, Take 1 tablet (25 mg total) by mouth daily., Disp: 90 tablet, Rfl: 3   gabapentin  (NEURONTIN ) 300 MG capsule, Take 300 mg by mouth 2 (two) times daily. , Disp: , Rfl:    methocarbamol  (ROBAXIN ) 500 MG tablet, Take 500 mg by mouth 2 (two) times daily., Disp: , Rfl:    metoprolol  tartrate (LOPRESSOR ) 25 MG tablet, Take 1 tablet (25 mg total) by mouth 2 (two) times daily., Disp: 60 tablet, Rfl: 1   Multiple Vitamin (MULTIVITAMIN WITH MINERALS) TABS tablet, Take 1 tablet by mouth daily., Disp: , Rfl:    naproxen (NAPROSYN) 500 MG tablet, Take 500 mg by mouth 2 (two) times daily as needed for moderate pain (pain score 4-6)., Disp: , Rfl:    omeprazole  (PRILOSEC) 20 MG capsule, TAKE 1 CAPSULE BY MOUTH DAILY BEFORE BREAKFAST. MAY TAKE ADDITIONAL CAPSULE BEFORE SUPPER IF NEEDED (Patient taking differently: Take 20 mg by mouth daily. *May take one additional capsule at bedtime as needed for acid reflux), Disp: 180 capsule, Rfl: 3   Plecanatide  (TRULANCE ) 3 MG TABS, Take 1 tablet (3 mg total) by mouth daily., Disp: 30 tablet, Rfl: 11   tamsulosin  (FLOMAX ) 0.4 MG CAPS capsule, Take 0.4 mg by mouth daily., Disp: , Rfl:   Past Medical  History: Past Medical History:  Diagnosis Date   Arthritis    Chronic back pain    Forklift injury   Essential hypertension, benign    GERD (gastroesophageal reflux disease)    Heart murmur    History of medication noncompliance     Tobacco Use: Social History   Tobacco Use  Smoking Status Every Day   Types: Cigars  Smokeless Tobacco Never  Tobacco Comments   smokes about 1 black and milds per day     Labs: Review Flowsheet  More data may exist      Latest Ref Rng & Units 11/01/2009 11/11/2010 06/18/2023 08/18/2023 08/20/2023  Labs for ITP Cardiac and Pulmonary Rehab  Hemoglobin A1c 4.8 - 5.6 % - - - 4.7  -  PH, Arterial 7.35 - 7.45 - - 7.381  - 7.352  7.354  7.336  7.326  7.343  7.374   PCO2 arterial 32 - 48 mmHg - - 43.4  - 42.8  40.3  39.9  45.8  44.9  46.1   Bicarbonate 20.0 - 28.0 mmol/L - - 26.3  27.8  25.7  - 23.7  22.4  21.6  23.9  27.0  24.4  26.9   TCO2 22 - 32 mmol/L 27  25  28  29  27   - 25  24  23  25  25  25  26  29  26  27  26  28    Acid-base deficit  0.0 - 2.0 mmol/L - - - - 2.0  3.0  4.0  2.0  1.0   O2 Saturation % - - 65  78  91  - 99  98  96  100  89  100  100     Details       Multiple values from one day are sorted in reverse-chronological order         Capillary Blood Glucose: Lab Results  Component Value Date   GLUCAP 131 (H) 12/26/2023   GLUCAP 84 08/24/2023   GLUCAP 110 (H) 08/24/2023   GLUCAP 132 (H) 08/23/2023   GLUCAP 98 08/23/2023     Exercise Target Goals: Exercise Program Goal: Individual exercise prescription set using results from initial 6 min walk test and THRR while considering  patient's activity barriers and safety.   Exercise Prescription Goal: Starting with aerobic activity 30 plus minutes a day, 3 days per week for initial exercise prescription. Provide home exercise prescription and guidelines that participant acknowledges understanding prior to discharge.  Activity Barriers & Risk Stratification:  Activity Barriers &  Cardiac Risk Stratification - 10/08/23 1352       Activity Barriers & Cardiac Risk Stratification   Activity Barriers Shortness of Breath;Assistive Device;Balance Concerns;Arthritis   OA in hands.   Cardiac Risk Stratification Moderate          6 Minute Walk:  6 Minute Walk     Row Name 10/08/23 1515 12/25/23 0930       6 Minute Walk   Phase Initial Discharge    Distance 900 feet 880 feet    Distance % Change -- -2.2 %    Distance Feet Change -- 20 ft    Walk Time 6 minutes 6 minutes    # of Rest Breaks 0 0    MPH 1.7 1.67    METS 2.62 2.48    RPE 13 15    Perceived Dyspnea  1 --    VO2 Peak 9.16 8.69    Symptoms No Yes (comment)    Comments -- chronic leg pain 8/10 (has worsened throughout program, now using rollator)    Resting HR 51 bpm 72 bpm    Resting BP 150/100 122/60    Resting Oxygen Saturation  97 % --    Exercise Oxygen Saturation  during 6 min walk 97 % --    Max Ex. HR 63 bpm 91 bpm    Max Ex. BP 158/90 132/64    2 Minute Post BP 148/90 --       Oxygen Initial Assessment:   Oxygen Re-Evaluation:   Oxygen Discharge (Final Oxygen Re-Evaluation):   Initial Exercise Prescription:  Initial Exercise Prescription - 10/08/23 1500       Date of Initial Exercise RX and Referring Provider   Date 10/08/23    Referring Provider Wendel Haws MD      Treadmill   MPH 0.8    Grade 0    Minutes 15    METs 1.7      NuStep   Level 3    SPM 60    Minutes 15    METs 2      Prescription Details   Frequency (times per week) 3    Duration Progress to 30 minutes of continuous aerobic without signs/symptoms of physical distress      Intensity   THRR 40-80% of Max Heartrate 93-134    Ratings of Perceived Exertion 11-13    Perceived Dyspnea 0-4  Resistance Training   Training Prescription Yes    Weight 4    Reps 10-15          Perform Capillary Blood Glucose checks as needed.  Exercise Prescription Changes:   Exercise Prescription  Changes     Row Name 10/08/23 1500 10/19/23 1500 10/28/23 1300 11/16/23 1300 12/02/23 1300     Response to Exercise   Blood Pressure (Admit) 150/100 114/62 144/84 120/68 98/62   Blood Pressure (Exercise) 158/90 158/78 -- -- --   Blood Pressure (Exit) 148/90 148/80 120/78 124/70 118/70   Heart Rate (Admit) 51 bpm 57 bpm 46 bpm 65 bpm 59 bpm   Heart Rate (Exercise) 63 bpm 90 bpm 116 bpm 81 bpm 80 bpm   Heart Rate (Exit) 54 bpm 51 bpm 88 bpm 72 bpm 75 bpm   Oxygen Saturation (Admit) 97 % -- -- -- --   Oxygen Saturation (Exercise) 97 % -- -- -- --   Oxygen Saturation (Exit) 97 % -- -- -- --   Rating of Perceived Exertion (Exercise) 13 13 13 13 15    Perceived Dyspnea (Exercise) 1 1 -- -- --   Duration -- Continue with 30 min of aerobic exercise without signs/symptoms of physical distress. Continue with 30 min of aerobic exercise without signs/symptoms of physical distress. Continue with 30 min of aerobic exercise without signs/symptoms of physical distress. Continue with 30 min of aerobic exercise without signs/symptoms of physical distress.   Intensity -- THRR unchanged THRR unchanged THRR unchanged THRR unchanged     Progression   Progression -- Continue to progress workloads to maintain intensity without signs/symptoms of physical distress. Continue to progress workloads to maintain intensity without signs/symptoms of physical distress. Continue to progress workloads to maintain intensity without signs/symptoms of physical distress. Continue to progress workloads to maintain intensity without signs/symptoms of physical distress.     Resistance Training   Training Prescription -- Yes Yes Yes Yes   Weight -- 4 4 4 4    Reps -- 10-15 10-15 10-15 10-15     Treadmill   MPH -- 2 1.6 -- --   Grade -- 0 0 -- --   Minutes -- 13 15 -- --   METs -- 2.53 2.23 -- --     NuStep   Level -- 6 -- 5 5   SPM -- 90 -- 104 87   Minutes -- 15 -- 15 15   METs -- 2.2 -- 2.6 2.3     Recumbant Elliptical    Level -- -- 3 5 5    RPM -- -- 45 40 50   Minutes -- -- 15 15 15    METs -- -- 3.1 3.9 3.8    Row Name 12/11/23 0800             Response to Exercise   Blood Pressure (Admit) 122/56       Blood Pressure (Exit) 132/74       Heart Rate (Admit) 69 bpm       Heart Rate (Exercise) 104 bpm       Heart Rate (Exit) 77 bpm       Rating of Perceived Exertion (Exercise) 15       Duration Continue with 30 min of aerobic exercise without signs/symptoms of physical distress.       Intensity THRR unchanged         Progression   Progression Continue to progress workloads to maintain intensity without signs/symptoms of physical distress.  Resistance Training   Training Prescription Yes       Weight 4       Reps 10-15         NuStep   Level 5       SPM 94       Minutes 15       METs 2.4         Recumbant Elliptical   Level 6       RPM 51       Minutes 15       METs 4.5          Exercise Comments:   Exercise Goals and Review:   Exercise Goals     Row Name 10/08/23 1519             Exercise Goals   Increase Physical Activity Yes       Intervention Provide advice, education, support and counseling about physical activity/exercise needs.;Develop an individualized exercise prescription for aerobic and resistive training based on initial evaluation findings, risk stratification, comorbidities and participant's personal goals.       Expected Outcomes Short Term: Attend rehab on a regular basis to increase amount of physical activity.;Long Term: Add in home exercise to make exercise part of routine and to increase amount of physical activity.;Long Term: Exercising regularly at least 3-5 days a week.       Increase Strength and Stamina Yes       Intervention Provide advice, education, support and counseling about physical activity/exercise needs.;Develop an individualized exercise prescription for aerobic and resistive training based on initial evaluation findings, risk  stratification, comorbidities and participant's personal goals.       Expected Outcomes Short Term: Perform resistance training exercises routinely during rehab and add in resistance training at home;Short Term: Increase workloads from initial exercise prescription for resistance, speed, and METs.;Long Term: Improve cardiorespiratory fitness, muscular endurance and strength as measured by increased METs and functional capacity ( )       Able to understand and use rate of perceived exertion (RPE) scale Yes       Intervention Provide education and explanation on how to use RPE scale       Expected Outcomes Short Term: Able to use RPE daily in rehab to express subjective intensity level;Long Term:  Able to use RPE to guide intensity level when exercising independently       Able to understand and use Dyspnea scale Yes       Intervention Provide education and explanation on how to use Dyspnea scale       Expected Outcomes Short Term: Able to use Dyspnea scale daily in rehab to express subjective sense of shortness of breath during exertion;Long Term: Able to use Dyspnea scale to guide intensity level when exercising independently       Knowledge and understanding of Target Heart Rate Range (THRR) Yes       Intervention Provide education and explanation of THRR including how the numbers were predicted and where they are located for reference       Expected Outcomes Short Term: Able to state/look up THRR;Long Term: Able to use THRR to govern intensity when exercising independently;Short Term: Able to use daily as guideline for intensity in rehab       Able to check pulse independently Yes       Intervention Provide education and demonstration on how to check pulse in carotid and radial arteries.;Review the importance of being able to check your own  pulse for safety during independent exercise       Expected Outcomes Short Term: Able to explain why pulse checking is important during independent  exercise;Long Term: Able to check pulse independently and accurately       Understanding of Exercise Prescription Yes       Intervention Provide education, explanation, and written materials on patient's individual exercise prescription       Expected Outcomes Short Term: Able to explain program exercise prescription;Long Term: Able to explain home exercise prescription to exercise independently          Exercise Goals Re-Evaluation :  Exercise Goals Re-Evaluation     Row Name 11/16/23 1004 12/25/23 1331           Exercise Goal Re-Evaluation   Exercise Goals Review Increase Physical Activity;Increase Strength and Stamina;Able to understand and use rate of perceived exertion (RPE) scale;Knowledge and understanding of Target Heart Rate Range (THRR);Understanding of Exercise Prescription Increase Physical Activity;Increase Strength and Stamina;Understanding of Exercise Prescription      Comments Stephen Koch has been having a hard time doing home exercise as his legs hurt him all the time and it makes it hard to get around. He has been using a rollating walker to help with balance and get in more steps. He has been doing sitting exercises here at rehab like the XR and Nustep. Stephen Koch did not do as well on his post 6WMT but his legs have gotten worse throughout program.  He is feeling better overall.  He plans to use his stationary bike at home for exercise after graduation      Expected Outcomes Short: Continue to attend rehab to increase strength and stamina. Long: Incorporate more exercise at home. Short: Graduate Long: Continue to exercise independently          Discharge Exercise Prescription (Final Exercise Prescription Changes):  Exercise Prescription Changes - 12/11/23 0800       Response to Exercise   Blood Pressure (Admit) 122/56    Blood Pressure (Exit) 132/74    Heart Rate (Admit) 69 bpm    Heart Rate (Exercise) 104 bpm    Heart Rate (Exit) 77 bpm    Rating of Perceived Exertion  (Exercise) 15    Duration Continue with 30 min of aerobic exercise without signs/symptoms of physical distress.    Intensity THRR unchanged      Progression   Progression Continue to progress workloads to maintain intensity without signs/symptoms of physical distress.      Resistance Training   Training Prescription Yes    Weight 4    Reps 10-15      NuStep   Level 5    SPM 94    Minutes 15    METs 2.4      Recumbant Elliptical   Level 6    RPM 51    Minutes 15    METs 4.5          Nutrition:  Target Goals: Understanding of nutrition guidelines, daily intake of sodium 1500mg , cholesterol 200mg , calories 30% from fat and 7% or less from saturated fats, daily to have 5 or more servings of fruits and vegetables.  Biometrics:  Pre Biometrics - 10/08/23 1520       Pre Biometrics   Height 6' 2 (1.88 m)    Weight 184 lb 11.9 oz (83.8 kg)    Waist Circumference 39 inches    Hip Circumference 40 inches    Waist to Hip Ratio 0.98 %  BMI (Calculated) 23.71    Grip Strength 40.7 kg    Single Leg Stand 2.32 seconds          Post Biometrics - 12/25/23 0932        Post  Biometrics   Height 6' 2 (1.88 m)    Weight 212 lb 1.6 oz (96.2 kg)    Waist Circumference 39 inches    Hip Circumference 41 inches    Waist to Hip Ratio 0.95 %    BMI (Calculated) 27.22    Grip Strength 46.8 kg    Single Leg Stand 4.6 seconds          Nutrition Therapy Plan and Nutrition Goals:   Nutrition Assessments:  MEDIFICTS Score Key: >=70 Need to make dietary changes  40-70 Heart Healthy Diet <= 40 Therapeutic Level Cholesterol Diet  Flowsheet Row CARDIAC REHAB PHASE II EXERCISE from 01/06/2024 in [facility] CARDIAC REHABILITATION  Picture Your Plate Total Score on Discharge 56   Picture Your Plate Scores: <59 Unhealthy dietary pattern with much room for improvement. 41-50 Dietary pattern unlikely to meet recommendations for good health and room for improvement. 51-60 More  healthful dietary pattern, with some room for improvement.  >60 Healthy dietary pattern, although there may be some specific behaviors that could be improved.    Nutrition Goals Re-Evaluation:  Nutrition Goals Re-Evaluation     Row Name 11/16/23 1001 12/25/23 1333           Goals   Nutrition Goal Healthy eating. Short: Continue to add in more water  daily. Long: Work on [employer].      Comment Stephen Koch has cut back on his coffee intake, drinking more water  and less soda. He states he drinks about 2 16oz bottles a day. With eating he still is working on eating healthy as he kind of just eats what he wants now. Stephen Koch continues to work on adding in water .  He is eating much better overall and plans to stick with it      Expected Outcome Short: Continue to add in more water  daily. Long: Work on [employer]. Continue to follow heart healthy diet         Nutrition Goals Discharge (Final Nutrition Goals Re-Evaluation):  Nutrition Goals Re-Evaluation - 12/25/23 1333       Goals   Nutrition Goal Short: Continue to add in more water  daily. Long: Work on [employer].    Comment Stephen Koch continues to work on adding in water .  He is eating much better overall and plans to stick with it    Expected Outcome Continue to follow heart healthy diet          Psychosocial: Target Goals: Acknowledge presence or absence of significant depression and/or stress, maximize coping skills, provide positive support system. Participant is able to verbalize types and ability to use techniques and skills needed for reducing stress and depression.  Initial Review & Psychosocial Screening:  Initial Psych Review & Screening - 10/08/23 1531       Initial Review   Current issues with Current Sleep Concerns      Family Dynamics   Good Support System? Yes      Barriers   Psychosocial barriers to participate in program The patient should benefit from training in stress management and relaxation.;There are no  identifiable barriers or psychosocial needs.;Psychosocial barriers identified (see note)      Screening Interventions   Interventions Encouraged to exercise;To provide support and resources with identified psychosocial needs;Provide  feedback about the scores to participant    Expected Outcomes Short Term goal: Utilizing psychosocial counselor, staff and physician to assist with identification of specific Stressors or current issues interfering with healing process. Setting desired goal for each stressor or current issue identified.;Long Term Goal: Stressors or current issues are controlled or eliminated.;Short Term goal: Identification and review with participant of any Quality of Life or Depression concerns found by scoring the questionnaire.;Long Term goal: The participant improves quality of Life and PHQ9 Scores as seen by post scores and/or verbalization of changes          Quality of Life Scores:  Quality of Life - 01/06/24 1048       Quality of Life Scores   Health/Function Pre 15.5 %    Health/Function Post 20.93 %    Health/Function % Change 35.03 %    Socioeconomic Pre 28.8 %    Socioeconomic Post 26.81 %    Socioeconomic % Change  -6.91 %    Psych/Spiritual Pre 20 %    Psych/Spiritual Post 27 %    Psych/Spiritual % Change 35 %    Family Pre 26.4 %    Family Post 25.3 %    Family % Change -4.17 %    GLOBAL Pre 20.6 %    GLOBAL Post 24.11 %    GLOBAL % Change 17.04 %         Scores of 19 and below usually indicate a poorer quality of life in these areas.  A difference of  2-3 points is a clinically meaningful difference.  A difference of 2-3 points in the total score of the Quality of Life Index has been associated with significant improvement in overall quality of life, self-image, physical symptoms, and general health in studies assessing change in quality of life.  PHQ-9: Review Flowsheet       01/06/2024 10/08/2023  Depression screen PHQ 2/9  Decreased Interest 1 0   Down, Depressed, Hopeless 0 0  PHQ - 2 Score 1 0  Altered sleeping 0 3  Tired, decreased energy 2 1  Change in appetite 0 3  Feeling bad or failure about yourself  0 0  Trouble concentrating 0 0  Moving slowly or fidgety/restless 1 3  Suicidal thoughts 0 0  PHQ-9 Score 4 10  Difficult doing work/chores Not difficult at all Very difficult   Interpretation of Total Score  Total Score Depression Severity:  1-4 = Minimal depression, 5-9 = Mild depression, 10-14 = Moderate depression, 15-19 = Moderately severe depression, 20-27 = Severe depression   Psychosocial Evaluation and Intervention:  Psychosocial Evaluation - 10/08/23 1531       Psychosocial Evaluation & Interventions   Interventions Stress management education;Relaxation education;Encouraged to exercise with the program and follow exercise prescription    Comments Patient was referred to CR with AV replacement. He denies any depression, anxiety or stressors. He lives along but is married. His wife is currently living with her sister as her caregiver. His PHQ-9 score was 10 due to sleeping too much during the day; overeating and having little energy. He says he has great support from his wife, mother, and 2 brothers. He does have trouble staying alseep at night but he thinks he sleeps too much during the day. He hopes this will improve when he feels stronger.  He seems very motivated to participate in the program and was very active prior to his surgery. He want to improve his strength, stamina, and energy; get back to  doing all the activities he was doing prior to his surgery and to quit smoking. He has no barriers identified to participate in the program.    Expected Outcomes Short Term: Patient will start the program and attend consistently. Long Term: Patient will complete the program meeting personal goals.    Continue Psychosocial Services  Follow up required by staff          Psychosocial Re-Evaluation:  Psychosocial  Re-Evaluation     Row Name 11/16/23 1000 12/25/23 1332           Psychosocial Re-Evaluation   Current issues with None Identified None Identified      Comments Stephen Koch is doing well in rehab. He states that he sleeps well at night and stays away from stress! He likes to stay positive! Stephen Koch has enjoyed the program. He says it has gotten him motivated to move more.  He is feeling better overall and able to do more now than when he started despite his legs hurting more.      Expected Outcomes Short: Continue to attend rehab. Long: Continue to stay positive and away from stress. Continue to stay positive and keep moving      Interventions Encouraged to attend Cardiac Rehabilitation for the exercise Encouraged to attend Cardiac Rehabilitation for the exercise      Continue Psychosocial Services  Follow up required by staff Follow up required by staff         Psychosocial Discharge (Final Psychosocial Re-Evaluation):  Psychosocial Re-Evaluation - 12/25/23 1332       Psychosocial Re-Evaluation   Current issues with None Identified    Comments Stephen Koch has enjoyed the program. He says it has gotten him motivated to move more.  He is feeling better overall and able to do more now than when he started despite his legs hurting more.    Expected Outcomes Continue to stay positive and keep moving    Interventions Encouraged to attend Cardiac Rehabilitation for the exercise    Continue Psychosocial Services  Follow up required by staff          Vocational Rehabilitation: Provide vocational rehab assistance to qualifying candidates.   Vocational Rehab Evaluation & Intervention:  Vocational Rehab - 10/08/23 1527       Initial Vocational Rehab Evaluation & Intervention   Assessment shows need for Vocational Rehabilitation No      Vocational Rehab Re-Evaulation   Comments Patient is disabled.          Education: Education Goals: Education classes will be provided on a weekly basis, covering  required topics. Participant will state understanding/return demonstration of topics presented.  Learning Barriers/Preferences:  Learning Barriers/Preferences - 10/08/23 1404       Learning Barriers/Preferences   Learning Barriers None    Learning Preferences Audio;Written Material;Skilled Demonstration          Education Topics: Hypertension, Hypertension Reduction -Define heart disease and high blood pressure. Discus how high blood pressure affects the body and ways to reduce high blood pressure. Flowsheet Row CARDIAC REHAB PHASE II EXERCISE from 12/02/2023 in [place] [facility] CARDIAC REHABILITATION  Date 11/18/23  Educator jh  Instruction Review Code 1- Verbalizes Understanding    Exercise and Your Heart -Discuss why it is important to exercise, the FITT principles of exercise, normal and abnormal responses to exercise, and how to exercise safely. Flowsheet Row CARDIAC REHAB PHASE II EXERCISE from 12/02/2023 in [place] [facility]  Date 10/14/23  Educator hj  Instruction Review Code 1-  Verbalizes Understanding    Angina -Discuss definition of angina, causes of angina, treatment of angina, and how to decrease risk of having angina.   Cardiac Medications -Review what the following cardiac medications are used for, how they affect the body, and side effects that may occur when taking the medications.  Medications include Aspirin , Beta blockers, calcium  channel blockers, ACE Inhibitors, angiotensin receptor blockers, diuretics, digoxin, and antihyperlipidemics.   Congestive Heart Failure -Discuss the definition of CHF, how to live with CHF, the signs and symptoms of CHF, and how keep track of weight and sodium intake. Flowsheet Row CARDIAC REHAB PHASE II EXERCISE from 12/02/2023 in [place] [facility]  Date 11/18/23  Educator jh  Instruction Review Code 1- Verbalizes Understanding    Heart Disease and Intimacy -Discus the effect sexual activity has on  the heart, how changes occur during intimacy as we age, and safety during sexual activity. Flowsheet Row CARDIAC REHAB PHASE II EXERCISE from 12/02/2023 in [place] [facility]  Date 10/28/23  Educator [facility]  Instruction Review Code 1- Verbalizes Understanding    Smoking Cessation / COPD -Discuss different methods to quit smoking, the health benefits of quitting smoking, and the definition of COPD. Flowsheet Row CARDIAC REHAB PHASE II EXERCISE from 12/02/2023 in [place] [facility]  Date 12/02/23  Educator Jh  Instruction Review Code 1- Verbalizes Understanding    Nutrition I: Fats -Discuss the types of cholesterol, what cholesterol does to the heart, and how cholesterol levels can be controlled. Flowsheet Row CARDIAC REHAB PHASE II EXERCISE from 12/02/2023 in [place] [facility]  Date 11/04/23  Educator jh  Instruction Review Code 1- Verbalizes Understanding    Nutrition II: Labels -Discuss the different components of food labels and how to read food label Flowsheet Row CARDIAC REHAB PHASE II EXERCISE from 12/02/2023 in [place] [facility]  Date 11/04/23  Educator jh  Instruction Review Code 1- Verbalizes Understanding    Heart Parts/Heart Disease and PAD -Discuss the anatomy of the heart, the pathway of blood circulation through the heart, and these are affected by heart disease.   Stress I: Signs and Symptoms -Discuss the causes of stress, how stress may lead to anxiety and depression, and ways to limit stress. Flowsheet Row CARDIAC REHAB PHASE II EXERCISE from 12/02/2023 in [place] [facility]  Date 11/11/23  Educator hb  Instruction Review Code 1- Verbalizes Understanding    Stress II: Relaxation -Discuss different types of relaxation techniques to limit stress. Flowsheet Row CARDIAC REHAB PHASE II EXERCISE from 12/02/2023 in [place] [facility]  Date 11/11/23  Educator hb  Instruction Review Code  1- Verbalizes Understanding    Warning Signs of Stroke / TIA -Discuss definition of a stroke, what the signs and symptoms are of a stroke, and how to identify when someone is having stroke.   Knowledge Questionnaire Score:  Knowledge Questionnaire Score - 01/06/24 1047       Knowledge Questionnaire Score   Post Score 23/28          Core Components/Risk Factors/Patient Goals at Admission:  Personal Goals and Risk Factors at Admission - 10/08/23 1527       Core Components/Risk Factors/Patient Goals on Admission    Weight Management Weight Maintenance    Improve shortness of breath with ADL's Yes    Intervention Provide education, individualized exercise plan and daily activity instruction to help decrease symptoms of SOB with activities of daily living.    Expected Outcomes Short Term:  Improve cardiorespiratory fitness to achieve a reduction of symptoms when performing ADLs;Long Term: Be able to perform more ADLs without symptoms or delay the onset of symptoms    Heart Failure Yes    Intervention Provide a combined exercise and nutrition program that is supplemented with education, support and counseling about heart failure. Directed toward relieving symptoms such as shortness of breath, decreased exercise tolerance, and extremity edema.    Expected Outcomes Improve functional capacity of life;Short term: Attendance in program 2-3 days a week with increased exercise capacity. Reported lower sodium intake. Reported increased fruit and vegetable intake. Reports medication compliance.;Short term: Daily weights obtained and reported for increase. Utilizing diuretic protocols set by physician.;Long term: Adoption of self-care skills and reduction of barriers for early signs and symptoms recognition and intervention leading to self-care maintenance.    Hypertension Yes    Intervention Provide education on lifestyle modifcations including regular physical activity/exercise, weight management,  moderate sodium restriction and increased consumption of fresh fruit, vegetables, and low fat dairy, alcohol  moderation, and smoking cessation.;Monitor prescription use compliance.    Expected Outcomes Short Term: Continued assessment and intervention until BP is < 140/72mm HG in hypertensive participants. < 130/69mm HG in hypertensive participants with diabetes, heart failure or chronic kidney disease.;Long Term: Maintenance of blood pressure at goal levels.    Lipids Yes    Intervention Provide education and support for participant on nutrition & aerobic/resistive exercise along with prescribed medications to achieve LDL 70mg , HDL >40mg .    Expected Outcomes Short Term: Participant states understanding of desired cholesterol values and is compliant with medications prescribed. Participant is following exercise prescription and nutrition guidelines.;Long Term: Cholesterol controlled with medications as prescribed, with individualized exercise RX and with personalized nutrition plan. Value goals: LDL < 70mg , HDL > 40 mg.          Core Components/Risk Factors/Patient Goals Review:   Goals and Risk Factor Review     Row Name 11/16/23 1003 12/25/23 1333           Core Components/Risk Factors/Patient Goals Review   Personal Goals Review Hypertension;Weight Management/Obesity Hypertension;Weight Management/Obesity      Review Stephen Koch is doing well in rehab. He states he is taking all his medications as prescribed and checks his BP occassionally at home. Stephen Koch is doing well in rehab and nearing graduation.  His weight is fairly steady overall.  His pressures are doing well.  He will continue to keep an eye on them to maintain.      Expected Outcomes Short: Continue to attend rehab. Long: Continue checking BP at home and report any abnormalities to medical staff. Continue to monitor risk factors.         Core Components/Risk Factors/Patient Goals at Discharge (Final Review):   Goals and Risk Factor  Review - 12/25/23 1333       Core Components/Risk Factors/Patient Goals Review   Personal Goals Review Hypertension;Weight Management/Obesity    Review Stephen Koch is doing well in rehab and nearing graduation.  His weight is fairly steady overall.  His pressures are doing well.  He will continue to keep an eye on them to maintain.    Expected Outcomes Continue to monitor risk factors.          ITP Comments:  ITP Comments     Row Name 10/08/23 1539 10/09/23 1105 10/14/23 1004 11/11/23 0833 11/27/23 0933   ITP Comments Patient arrived for 1st visit/orientation/education at 1330. 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 During orientation advised patient on arrival and appointment times what to wear, what to do before, during and after exercise. Reviewed attendance and class policy.  Pt is scheduled to return Cardiac Rehab on 10/09/23 at 915. Pt was advised to come to class 15 minutes before class starts.  Discussed RPE/Dpysnea scales. Patient participated in warm up stretches. Patient was able to complete 6 minute walk test.  Telemetry:NSR with some PVC's. Patient was measured for the equipment. Discussed equipment safety with patient. Took patient pre-anthropometric measurements. Patient finished visit at 1510. First full day of exercise!  Patient was oriented to gym and equipment including functions, settings, policies, and procedures.  Patient's individual exercise prescription and treatment plan were reviewed.  All starting workloads were established based on the results of the 6 minute walk test done at initial orientation visit.  The plan for exercise progression was also introduced and progression will be customized based on patient's performance and goals. 30 day review completed. ITP sent to Dr. Dorn Ross, Medical Director of Cardiac Rehab. Continue with ITP unless changes are made by physician. Pt new to program 30 day review completed. ITP sent to Dr.  Dorn Ross, Medical Director of Cardiac Rehab. Continue with ITP unless changes are made by physician. Called patient. He continues to have pain throughout his body. He plans to return Monday.    Row Name 12/09/23 0931 12/09/23 1002 01/06/24 1434       ITP Comments Called patient regarding missed sessions. Left VM. 30 day review completed. ITP sent to Dr. Dorn Ross, Medical Director of Cardiac Rehab. Continue with ITP unless changes are made by physician. 30 day review completed. ITP sent to Dr. Dorn Ross, Medical Director of Cardiac Rehab. Continue with ITP unless changes are made by physician.        Comments: 30 day review

## 2024-01-06 NOTE — Progress Notes (Signed)
 Daily Session Note  Patient Details  Name: Stephen Koch MRN: [id number] Date of Birth: 1960/09/28 Referring Provider:   Flowsheet Row CARDIAC REHAB PHASE II ORIENTATION from 10/08/2023 in [facility]  Referring Provider Wendel Haws MD    Encounter Date: 01/06/2024  Check In:  Session Check In - 01/06/24 0913       Check-In   Supervising physician immediately available to respond to emergencies See telemetry face sheet for immediately available MD    Location [facility]    Staff Present Powell Benders, BS, Exercise Physiologist;Cameran Ahmed Jackquline, BSN, RN, WTA-C    Virtual Visit No    Medication changes reported     No    Fall or balance concerns reported    No    Tobacco Cessation No Change    Warm-up and Cool-down Performed on first and last piece of equipment    Resistance Training Performed Yes    VAD Patient? No    PAD/SET Patient? No      Pain Assessment   Currently in Pain? No/denies          Capillary Blood Glucose: No results found for this or any previous visit (from the past 24 hours).    Social History   Tobacco Use  Smoking Status Every Day   Types: Cigars  Smokeless Tobacco Never  Tobacco Comments   smokes about 1 black and milds per day     Goals Met:  Independence with exercise equipment Exercise tolerated well No report of concerns or symptoms today Strength training completed today  Goals Unmet:  Not Applicable  Comments: Pt able to follow exercise prescription today without complaint.  Will continue to monitor for progression.

## 2024-01-08 ENCOUNTER — Encounter (HOSPITAL_COMMUNITY)
Admission: RE | Admit: 2024-01-08 | Discharge: 2024-01-08 | Disposition: A | Discharge status: Home / Self Care | Source: Ambulatory Visit | Attending: Internal Medicine | Admitting: Internal Medicine

## 2024-01-08 DIAGNOSIS — Z953 Presence of xenogenic heart valve: Secondary | ICD-10-CM | POA: Diagnosis not present

## 2024-01-08 NOTE — Progress Notes (Signed)
 Daily Session Note  Patient Details  Name: Stephen Koch MRN: [id number] Date of Birth: [birth year] Referring Provider:   Flowsheet Row CARDIAC REHAB PHASE II ORIENTATION from 10/08/2023 in [facility]  Referring Provider Wendel Haws MD    Encounter Date: 01/08/2024  Check In:  Session Check In - 01/08/24 0917       Check-In   Supervising physician immediately available to respond to emergencies See telemetry face sheet for immediately available MD    Location [facility]    Staff Present Powell Benders, BS, Exercise Physiologist;Jessica Vonzell, MA, RCEP, CCRP, CCET    Virtual Visit No    Medication changes reported     No    Fall or balance concerns reported    No    Tobacco Cessation No Change    Warm-up and Cool-down Performed on first and last piece of equipment    Resistance Training Performed Yes    VAD Patient? No    PAD/SET Patient? No      Pain Assessment   Currently in Pain? No/denies    Pain Score 0-No pain    Multiple Pain Sites No          Capillary Blood Glucose: No results found for this or any previous visit (from the past 24 hours).    Social History   Tobacco Use  Smoking Status Every Day   Types: Cigars  Smokeless Tobacco Never  Tobacco Comments   smokes about 1 black and milds per day     Goals Met:  Independence with exercise equipment Exercise tolerated well No report of concerns or symptoms today Strength training completed today  Goals Unmet:  Not Applicable  Comments: Pt able to follow exercise prescription today without complaint.  Will continue to monitor for progression.

## 2024-01-11 ENCOUNTER — Encounter (HOSPITAL_COMMUNITY)
Admission: RE | Admit: 2024-01-11 | Discharge: 2024-01-11 | Disposition: A | Discharge status: Home / Self Care | Source: Ambulatory Visit | Attending: Internal Medicine | Admitting: Internal Medicine

## 2024-01-11 ENCOUNTER — Telehealth: Payer: Self-pay

## 2024-01-11 DIAGNOSIS — Z953 Presence of xenogenic heart valve: Secondary | ICD-10-CM

## 2024-01-11 NOTE — Progress Notes (Signed)
 Daily Session Note  Patient Details  Name: Stephen Koch MRN: [id number] Date of Birth: 17-Oct-1960 Referring Provider:   Flowsheet Row CARDIAC REHAB PHASE II ORIENTATION from 10/08/2023 in [facility]  Referring Provider Stephen Haws MD    Encounter Date: 01/11/2024  Check In:  Session Check In - 01/11/24 0921       Check-In   Supervising physician immediately available to respond to emergencies See telemetry face sheet for immediately available MD    Location [facility]    Staff Present Stephen Koch, BS, Exercise Physiologist;Stephen Vonzell, MA, RCEP, CCRP, CCET;Stephen Koch, BSN, RN, WTA-C    Virtual Visit No    Medication changes reported     No    Fall or balance concerns reported    No    Tobacco Cessation No Change    Warm-up and Cool-down Performed on first and last piece of equipment    Resistance Training Performed Yes    VAD Patient? No    PAD/SET Patient? No      Pain Assessment   Currently in Pain? No/denies    Pain Score 0-No pain    Multiple Pain Sites No          Capillary Blood Glucose: No results found for this or any previous visit (from the past 24 hours).    Social History   Tobacco Use  Smoking Status Every Day   Types: Cigars  Smokeless Tobacco Never  Tobacco Comments   smokes about 1 black and milds per day     Goals Met:  Independence with exercise equipment Exercise tolerated well No report of concerns or symptoms today Strength training completed today  Goals Unmet:  Not Applicable  Comments:  Stephen Koch graduated today from  rehab with 36 sessions completed.  Details of the patient's exercise prescription and what He needs to do in order to continue the prescription and progress were discussed with patient.  Patient was given a copy of prescription and goals.  Patient verbalized understanding. Stephen Koch plans to continue to exercise by coming back to maintance or join a gym. 3

## 2024-01-11 NOTE — Telephone Encounter (Signed)
 Patient said monitor did not stay on and it it was blinking so he thought is was done and threw it away.  I will call [employer] and ask them to send another. I spoke with Jasmine.  I advised patient to return new monitor in box with side sealed and place in mailbox to return.

## 2024-01-11 NOTE — Progress Notes (Signed)
 Cardiac Individual Treatment Plan  Patient Details  Name: Stephen Koch MRN: [id number] Date of Birth: [birth year] Referring Provider:   Flowsheet Row CARDIAC REHAB PHASE II ORIENTATION from 10/08/2023 in [facility] CARDIAC REHABILITATION  Referring Provider Wendel Haws MD    Initial Encounter Date:  Flowsheet Row CARDIAC REHAB PHASE II ORIENTATION from 10/08/2023 in [facility] [facility]IAC [facility]IT[facility]  Date 10/08/23    Visit Diagnosis: S/P aortic valve replacement with bioprosthetic valve  Patient's Home Medications on Admission:  Current Outpatient Medications:    amLODipine  (NORVASC ) 10 MG tablet, Take 10 mg by mouth daily., Disp: , Rfl:    atorvastatin  (LIPITOR) 40 MG tablet, Take 40 mg by mouth daily., Disp: , Rfl:    chlorthalidone  (HYGROTON ) 25 MG tablet, Take 1 tablet (25 mg total) by mouth daily., Disp: 90 tablet, Rfl: 3   gabapentin  (NEURONTIN ) 300 MG capsule, Take 300 mg by mouth 2 (two) times daily. , Disp: , Rfl:    methocarbamol  (ROBAXIN ) 500 MG tablet, Take 500 mg by mouth 2 (two) times daily., Disp: , Rfl:    metoprolol  tartrate (LOPRESSOR ) 25 MG tablet, Take 1 tablet (25 mg total) by mouth 2 (two) times daily., Disp: 60 tablet, Rfl: 1   Multiple Vitamin (MULTIVITAMIN WITH MINERALS) TABS tablet, Take 1 tablet by mouth daily., Disp: , Rfl:    naproxen (NAPROSYN) 500 MG tablet, Take 500 mg by mouth 2 (two) times daily as needed for moderate pain (pain score 4-6)., Disp: , Rfl:    omeprazole  (PRILOSEC) 20 MG capsule, TAKE 1 CAPSULE BY MOUTH DAILY BEFORE BREAKFAST. MAY TAKE ADDITIONAL CAPSULE BEFORE SUPPER IF NEEDED (Patient taking differently: Take 20 mg by mouth daily. *May take one additional capsule at bedtime as needed for acid reflux), Disp: 180 capsule, Rfl: 3   Plecanatide  (TRULANCE ) 3 MG TABS, Take 1 tablet (3 mg total) by mouth daily., Disp: 30 tablet, Rfl: 11   tamsulosin  (FLOMAX ) 0.4 MG CAPS capsule, Take 0.4 mg by mouth daily., Disp: , Rfl:   Past Medical  History: Past Medical History:  Diagnosis Date   Arthritis    Chronic back pain    Forklift injury   Essential hypertension, benign    GERD (gastroesophageal reflux disease)    Heart murmur    History of medication noncompliance     Tobacco Use: Social History   Tobacco Use  Smoking Status Every Day   Types: Cigars  Smokeless Tobacco Never  Tobacco Comments   smokes about 1 black and milds per day     Labs: Review Flowsheet  More data may exist      Latest Ref Rng & Units 11/01/2009 11/11/2010 06/18/2023 08/18/2023 08/20/2023  Labs for ITP Cardiac and Pulmonary Rehab  Hemoglobin A1c 4.8 - 5.6 % - - - 4.7  -  PH, Arterial 7.35 - 7.45 - - 7.381  - 7.352  7.354  7.336  7.326  7.343  7.374   PCO2 arterial 32 - 48 mmHg - - 43.4  - 42.8  40.3  39.9  45.8  44.9  46.1   Bicarbonate 20.0 - 28.0 mmol/L - - 26.3  27.8  25.7  - 23.7  22.4  21.6  23.9  27.0  24.4  26.9   TCO2 22 - 32 mmol/L 27  25  28  29  27   - 25  24  23  25  25  25  26  29  26  27  26  28    Acid-base deficit  0.0 - 2.0 mmol/L - - - - 2.0  3.0  4.0  2.0  1.0   O2 Saturation % - - 65  78  91  - 99  98  96  100  89  100  100     Details       Multiple values from one day are sorted in reverse-chronological order          Exercise Target Goals: Exercise Program Goal: Individual exercise prescription set using results from initial 6 min walk test and THRR while considering  patient's activity barriers and safety.   Exercise Prescription Goal: Initial exercise prescription builds to 30-45 minutes a day of aerobic activity, 2-3 days per week.  Home exercise guidelines will be given to patient during program as part of exercise prescription that the participant will acknowledge.   Education: Aerobic Exercise: - Group verbal and visual presentation on the components of exercise prescription. Introduces F.I.T.T principle from ACSM for exercise prescriptions.  Reviews F.I.T.T. principles of aerobic exercise including  progression. Written material provided at class time. Flowsheet Row CARDIAC REHAB PHASE II EXERCISE from 01/06/2024 in [place] [facility]  Date 12/23/23  Educator jh  Instruction Review Code 2- Demonstrated Understanding    Education: Resistance Exercise: - Group verbal and visual presentation on the components of exercise prescription. Introduces F.I.T.T principle from ACSM for exercise prescriptions  Reviews F.I.T.T. principles of resistance exercise including progression. Written material provided at class time.    Education: Exercise & Equipment Safety: - Individual verbal instruction and demonstration of equipment use and safety with use of the equipment.   Education: Exercise Physiology & General Exercise Guidelines: - Group verbal and written instruction with models to review the exercise physiology of the cardiovascular system and associated critical values. Provides general exercise guidelines with specific guidelines to those with heart or lung disease. Written material provided at class time.   Education: Flexibility, Balance, Mind/Body Relaxation: - Group verbal and visual presentation with interactive activity on the components of exercise prescription. Introduces F.I.T.T principle from ACSM for exercise prescriptions. Reviews F.I.T.T. principles of flexibility and balance exercise training including progression. Also discusses the mind body connection.  Reviews various relaxation techniques to help reduce and manage stress (i.e. Deep breathing, progressive muscle relaxation, and visualization). Balance handout provided to take home. Written material provided at class time.   Activity Barriers & Risk Stratification:  Activity Barriers & Cardiac Risk Stratification - 10/08/23 1352       Activity Barriers & Cardiac Risk Stratification   Activity Barriers Shortness of Breath;Assistive Device;Balance Concerns;Arthritis   OA in hands.   Cardiac Risk Stratification  Moderate          6 Minute Walk:  6 Minute Walk     Row Name 10/08/23 1515 12/25/23 0930       6 Minute Walk   Phase Initial Discharge    Distance 900 feet 880 feet    Distance % Change -- -2.2 %    Distance Feet Change -- 20 ft    Walk Time 6 minutes 6 minutes    # of Rest Breaks 0 0    MPH 1.7 1.67    METS 2.62 2.48    RPE 13 15    Perceived Dyspnea  1 --    VO2 Peak 9.16 8.69    Symptoms No Yes (comment)    Comments -- chronic leg pain 8/10 (has worsened throughout program, now using rollator)    Resting HR  51 bpm 72 bpm    Resting BP 150/100 122/60    Resting Oxygen Saturation  97 % --    Exercise Oxygen Saturation  during 6 min walk 97 % --    Max Ex. HR 63 bpm 91 bpm    Max Ex. BP 158/90 132/64    2 Minute Post BP 148/90 --       Oxygen Initial Assessment:   Oxygen Re-Evaluation:   Oxygen Discharge (Final Oxygen Re-Evaluation):   Initial Exercise Prescription:  Initial Exercise Prescription - 10/08/23 1500       Date of Initial Exercise RX and Referring Provider   Date 10/08/23    Referring Provider Wendel Haws MD      Treadmill   MPH 0.8    Grade 0    Minutes 15    METs 1.7      NuStep   Level 3    SPM 60    Minutes 15    METs 2      Prescription Details   Frequency (times per week) 3    Duration Progress to 30 minutes of continuous aerobic without signs/symptoms of physical distress      Intensity   THRR 40-80% of Max Heartrate 93-134    Ratings of Perceived Exertion 11-13    Perceived Dyspnea 0-4      Resistance Training   Training Prescription Yes    Weight 4    Reps 10-15          Perform Capillary Blood Glucose checks as needed.  Exercise Prescription Changes:   Exercise Prescription Changes     Row Name 10/08/23 1500 10/19/23 1500 10/28/23 1300 11/16/23 1300 12/02/23 1300     Response to Exercise   Blood Pressure (Admit) 150/100 114/62 144/84 120/68 98/62   Blood Pressure (Exercise) 158/90 158/78 -- -- --    Blood Pressure (Exit) 148/90 148/80 120/78 124/70 118/70   Heart Rate (Admit) 51 bpm 57 bpm 46 bpm 65 bpm 59 bpm   Heart Rate (Exercise) 63 bpm 90 bpm 116 bpm 81 bpm 80 bpm   Heart Rate (Exit) 54 bpm 51 bpm 88 bpm 72 bpm 75 bpm   Oxygen Saturation (Admit) 97 % -- -- -- --   Oxygen Saturation (Exercise) 97 % -- -- -- --   Oxygen Saturation (Exit) 97 % -- -- -- --   Rating of Perceived Exertion (Exercise) 13 13 13 13 15    Perceived Dyspnea (Exercise) 1 1 -- -- --   Duration -- Continue with 30 min of aerobic exercise without signs/symptoms of physical distress. Continue with 30 min of aerobic exercise without signs/symptoms of physical distress. Continue with 30 min of aerobic exercise without signs/symptoms of physical distress. Continue with 30 min of aerobic exercise without signs/symptoms of physical distress.   Intensity -- THRR unchanged THRR unchanged THRR unchanged THRR unchanged     Progression   Progression -- Continue to progress workloads to maintain intensity without signs/symptoms of physical distress. Continue to progress workloads to maintain intensity without signs/symptoms of physical distress. Continue to progress workloads to maintain intensity without signs/symptoms of physical distress. Continue to progress workloads to maintain intensity without signs/symptoms of physical distress.     Resistance Training   Training Prescription -- Yes Yes Yes Yes   Weight -- 4 4 4 4    Reps -- 10-15 10-15 10-15 10-15     Treadmill   MPH -- 2 1.6 -- --   Grade -- 0 0 -- --  Minutes -- 13 15 -- --   METs -- 2.53 2.23 -- --     NuStep   Level -- 6 -- 5 5   SPM -- 90 -- 104 87   Minutes -- 15 -- 15 15   METs -- 2.2 -- 2.6 2.3     Recumbant Elliptical   Level -- -- 3 5 5    RPM -- -- 45 40 50   Minutes -- -- 15 15 15    METs -- -- 3.1 3.9 3.8    Row Name 12/11/23 0800             Response to Exercise   Blood Pressure (Admit) 122/56       Blood Pressure (Exit) 132/74        Heart Rate (Admit) 69 bpm       Heart Rate (Exercise) 104 bpm       Heart Rate (Exit) 77 bpm       Rating of Perceived Exertion (Exercise) 15       Duration Continue with 30 min of aerobic exercise without signs/symptoms of physical distress.       Intensity THRR unchanged         Progression   Progression Continue to progress workloads to maintain intensity without signs/symptoms of physical distress.         Resistance Training   Training Prescription Yes       Weight 4       Reps 10-15         NuStep   Level 5       SPM 94       Minutes 15       METs 2.4         Recumbant Elliptical   Level 6       RPM 51       Minutes 15       METs 4.5          Exercise Comments:   Exercise Comments     Row Name 01/11/24 9076           Exercise Comments Stephen Koch graduated today from  rehab with 36 sessions completed.  Details of the patient's exercise prescription and what He needs to do in order to continue the prescription and progress were discussed with patient.  Patient was given a copy of prescription and goals.  Patient verbalized understanding. Stephen Koch plans to continue to exercise by coming back to maintance or join a gym.          Exercise Goals and Review:   Exercise Goals     Row Name 10/08/23 1519             Exercise Goals   Increase Physical Activity Yes       Intervention Provide advice, education, support and counseling about physical activity/exercise needs.;Develop an individualized exercise prescription for aerobic and resistive training based on initial evaluation findings, risk stratification, comorbidities and participant's personal goals.       Expected Outcomes Short Term: Attend rehab on a regular basis to increase amount of physical activity.;Long Term: Add in home exercise to make exercise part of routine and to increase amount of physical activity.;Long Term: Exercising regularly at least 3-5 days a week.       Increase Strength and Stamina Yes        Intervention Provide advice, education, support and counseling about physical activity/exercise needs.;Develop an individualized exercise prescription for aerobic and resistive training  based on initial evaluation findings, risk stratification, comorbidities and participant's personal goals.       Expected Outcomes Short Term: Perform resistance training exercises routinely during rehab and add in resistance training at home;Short Term: Increase workloads from initial exercise prescription for resistance, speed, and METs.;Long Term: Improve cardiorespiratory fitness, muscular endurance and strength as measured by increased METs and functional capacity ( )       Able to understand and use rate of perceived exertion (RPE) scale Yes       Intervention Provide education and explanation on how to use RPE scale       Expected Outcomes Short Term: Able to use RPE daily in rehab to express subjective intensity level;Long Term:  Able to use RPE to guide intensity level when exercising independently       Able to understand and use Dyspnea scale Yes       Intervention Provide education and explanation on how to use Dyspnea scale       Expected Outcomes Short Term: Able to use Dyspnea scale daily in rehab to express subjective sense of shortness of breath during exertion;Long Term: Able to use Dyspnea scale to guide intensity level when exercising independently       Knowledge and understanding of Target Heart Rate Range (THRR) Yes       Intervention Provide education and explanation of THRR including how the numbers were predicted and where they are located for reference       Expected Outcomes Short Term: Able to state/look up THRR;Long Term: Able to use THRR to govern intensity when exercising independently;Short Term: Able to use daily as guideline for intensity in rehab       Able to check pulse independently Yes       Intervention Provide education and demonstration on how to check pulse in carotid  and radial arteries.;Review the importance of being able to check your own pulse for safety during independent exercise       Expected Outcomes Short Term: Able to explain why pulse checking is important during independent exercise;Long Term: Able to check pulse independently and accurately       Understanding of Exercise Prescription Yes       Intervention Provide education, explanation, and written materials on patient's individual exercise prescription       Expected Outcomes Short Term: Able to explain program exercise prescription;Long Term: Able to explain home exercise prescription to exercise independently          Exercise Goals Re-Evaluation :  Exercise Goals Re-Evaluation     Row Name 11/16/23 1004 12/25/23 1331           Exercise Goal Re-Evaluation   Exercise Goals Review Increase Physical Activity;Increase Strength and Stamina;Able to understand and use rate of perceived exertion (RPE) scale;Knowledge and understanding of Target Heart Rate Range (THRR);Understanding of Exercise Prescription Increase Physical Activity;Increase Strength and Stamina;Understanding of Exercise Prescription      Comments Stephen Koch has been having a hard time doing home exercise as his legs hurt him all the time and it makes it hard to get around. He has been using a rollating walker to help with balance and get in more steps. He has been doing sitting exercises here at rehab like the XR and Nustep. Stephen Koch did not do as well on his post 6WMT but his legs have gotten worse throughout program.  He is feeling better overall.  He plans to use his stationary bike at home for exercise after graduation  Expected Outcomes Short: Continue to attend rehab to increase strength and stamina. Long: Incorporate more exercise at home. Short: Graduate Long: Continue to exercise independently         Discharge Exercise Prescription (Final Exercise Prescription Changes):  Exercise Prescription Changes - 12/11/23 0800        Response to Exercise   Blood Pressure (Admit) 122/56    Blood Pressure (Exit) 132/74    Heart Rate (Admit) 69 bpm    Heart Rate (Exercise) 104 bpm    Heart Rate (Exit) 77 bpm    Rating of Perceived Exertion (Exercise) 15    Duration Continue with 30 min of aerobic exercise without signs/symptoms of physical distress.    Intensity THRR unchanged      Progression   Progression Continue to progress workloads to maintain intensity without signs/symptoms of physical distress.      Resistance Training   Training Prescription Yes    Weight 4    Reps 10-15      NuStep   Level 5    SPM 94    Minutes 15    METs 2.4      Recumbant Elliptical   Level 6    RPM 51    Minutes 15    METs 4.5          Nutrition:  Target Goals: Understanding of nutrition guidelines, daily intake of sodium 1500mg , cholesterol 200mg , calories 30% from fat and 7% or less from saturated fats, daily to have 5 or more servings of fruits and vegetables.  Education: Nutrition 1 -Group instruction provided by verbal, written material, interactive activities, discussions, models, and posters to present general guidelines for heart healthy nutrition including macronutrients, label reading, and promoting whole foods over processed counterparts. Education serves as Pensions consultant of discussion of heart healthy eating for all. Written material provided at class time. Flowsheet Row CARDIAC REHAB PHASE II EXERCISE from 01/06/2024 in [place] [facility]  Date 01/06/24  Educator DJ  Instruction Review Code 1- Verbalizes Understanding     Education: Nutrition 2 -Group instruction provided by verbal, written material, interactive activities, discussions, models, and posters to present general guidelines for heart healthy nutrition including sodium, cholesterol, and saturated fat. Providing guidance of habit forming to improve blood pressure, cholesterol, and body weight. Written material provided at class  time. Flowsheet Row CARDIAC REHAB PHASE II EXERCISE from 01/06/2024 in [place] [facility]  Date 01/06/24  Educator DJ  Instruction Review Code 1- Verbalizes Understanding      Biometrics:  Pre Biometrics - 10/08/23 1520       Pre Biometrics   Height 6' 2 (1.88 m)    Weight 83.8 kg    Waist Circumference 39 inches    Hip Circumference 40 inches    Waist to Hip Ratio 0.98 %    BMI (Calculated) 23.71    Grip Strength 40.7 kg    Single Leg Stand 2.32 seconds          Post Biometrics - 12/25/23 0932        Post  Biometrics   Height 6' 2 (1.88 m)    Weight 96.2 kg    Waist Circumference 39 inches    Hip Circumference 41 inches    Waist to Hip Ratio 0.95 %    BMI (Calculated) 27.22    Grip Strength 46.8 kg    Single Leg Stand 4.6 seconds          Nutrition Therapy Plan and Nutrition Goals:  Nutrition Assessments:  MEDIFICTS Score Key: >=70 Need to make dietary changes  40-70 Heart Healthy Diet <= 40 Therapeutic Level Cholesterol Diet  Flowsheet Row CARDIAC REHAB PHASE II EXERCISE from 01/06/2024 in [facility] CARDIAC REHABILITATION  Picture Your Plate Total Score on Discharge 56   Picture Your Plate Scores: <59 Unhealthy dietary pattern with much room for improvement. 41-50 Dietary pattern unlikely to meet recommendations for good health and room for improvement. 51-60 More healthful dietary pattern, with some room for improvement.  >60 Healthy dietary pattern, although there may be some specific behaviors that could be improved.    Nutrition Goals Re-Evaluation:  Nutrition Goals Re-Evaluation     Row Name 11/16/23 1001 12/25/23 1333           Goals   Nutrition Goal Healthy eating. Short: Continue to add in more water  daily. Long: Work on [employer].      Comment Stephen Koch has cut back on his coffee intake, drinking more water  and less soda. He states he drinks about 2 16oz bottles a day. With eating he still is working on eating  healthy as he kind of just eats what he wants now. Stephen Koch continues to work on adding in water .  He is eating much better overall and plans to stick with it      Expected Outcome Short: Continue to add in more water  daily. Long: Work on [employer]. Continue to follow heart healthy diet         Nutrition Goals Discharge (Final Nutrition Goals Re-Evaluation):  Nutrition Goals Re-Evaluation - 12/25/23 1333       Goals   Nutrition Goal Short: Continue to add in more water  daily. Long: Work on [employer].    Comment Stephen Koch continues to work on adding in water .  He is eating much better overall and plans to stick with it    Expected Outcome Continue to follow heart healthy diet          Psychosocial: Target Goals: Acknowledge presence or absence of significant depression and/or stress, maximize coping skills, provide positive support system. Participant is able to verbalize types and ability to use techniques and skills needed for reducing stress and depression.   Education: Stress, Anxiety, and Depression - Group verbal and visual presentation to define topics covered.  Reviews how body is impacted by stress, anxiety, and depression.  Also discusses healthy ways to reduce stress and to treat/manage anxiety and depression. Written material provided at class time.   Education: Sleep Hygiene -Provides group verbal and written instruction about how sleep can affect your health.  Define sleep hygiene, discuss sleep cycles and impact of sleep habits. Review good sleep hygiene tips.   Initial Review & Psychosocial Screening:  Initial Psych Review & Screening - 10/08/23 1531       Initial Review   Current issues with Current Sleep Concerns      Family Dynamics   Good Support System? Yes      Barriers   Psychosocial barriers to participate in program The patient should benefit from training in stress management and relaxation.;There are no identifiable barriers or psychosocial  needs.;Psychosocial barriers identified (see note)      Screening Interventions   Interventions Encouraged to exercise;To provide support and resources with identified psychosocial needs;Provide feedback about the scores to participant    Expected Outcomes Short Term goal: Utilizing psychosocial counselor, staff and physician to assist with identification of specific Stressors or current issues interfering with healing process. Setting desired  goal for each stressor or current issue identified.;Long Term Goal: Stressors or current issues are controlled or eliminated.;Short Term goal: Identification and review with participant of any Quality of Life or Depression concerns found by scoring the questionnaire.;Long Term goal: The participant improves quality of Life and PHQ9 Scores as seen by post scores and/or verbalization of changes          Quality of Life Scores:   Quality of Life - 01/06/24 1048       Quality of Life Scores   Health/Function Pre 15.5 %    Health/Function Post 20.93 %    Health/Function % Change 35.03 %    Socioeconomic Pre 28.8 %    Socioeconomic Post 26.81 %    Socioeconomic % Change  -6.91 %    Psych/Spiritual Pre 20 %    Psych/Spiritual Post 27 %    Psych/Spiritual % Change 35 %    Family Pre 26.4 %    Family Post 25.3 %    Family % Change -4.17 %    GLOBAL Pre 20.6 %    GLOBAL Post 24.11 %    GLOBAL % Change 17.04 %         Scores of 19 and below usually indicate a poorer quality of life in these areas.  A difference of  2-3 points is a clinically meaningful difference.  A difference of 2-3 points in the total score of the Quality of Life Index has been associated with significant improvement in overall quality of life, self-image, physical symptoms, and general health in studies assessing change in quality of life.  PHQ-9: Review Flowsheet       01/06/2024 10/08/2023  Depression screen PHQ 2/9  Decreased Interest 1 0  Down, Depressed, Hopeless 0 0   PHQ - 2 Score 1 0  Altered sleeping 0 3  Tired, decreased energy 2 1  Change in appetite 0 3  Feeling bad or failure about yourself  0 0  Trouble concentrating 0 0  Moving slowly or fidgety/restless 1 3  Suicidal thoughts 0 0  PHQ-9 Score 4 10  Difficult doing work/chores Not difficult at all Very difficult   Interpretation of Total Score  Total Score Depression Severity:  1-4 = Minimal depression, 5-9 = Mild depression, 10-14 = Moderate depression, 15-19 = Moderately severe depression, 20-27 = Severe depression   Psychosocial Evaluation and Intervention:  Psychosocial Evaluation - 10/08/23 1531       Psychosocial Evaluation & Interventions   Interventions Stress management education;Relaxation education;Encouraged to exercise with the program and follow exercise prescription    Comments Patient was referred to CR with AV replacement. He denies any depression, anxiety or stressors. He lives along but is married. His wife is currently living with her sister as her caregiver. His PHQ-9 score was 10 due to sleeping too much during the day; overeating and having little energy. He says he has great support from his wife, mother, and 2 brothers. He does have trouble staying alseep at night but he thinks he sleeps too much during the day. He hopes this will improve when he feels stronger.  He seems very motivated to participate in the program and was very active prior to his surgery. He want to improve his strength, stamina, and energy; get back to doing all the activities he was doing prior to his surgery and to quit smoking. He has no barriers identified to participate in the program.    Expected Outcomes Short Term: Patient will start  the program and attend consistently. Long Term: Patient will complete the program meeting personal goals.    Continue Psychosocial Services  Follow up required by staff          Psychosocial Re-Evaluation:  Psychosocial Re-Evaluation     Row Name 11/16/23  1000 12/25/23 1332           Psychosocial Re-Evaluation   Current issues with None Identified None Identified      Comments Stephen Koch is doing well in rehab. He states that he sleeps well at night and stays away from stress! He likes to stay positive! Stephen Koch has enjoyed the program. He says it has gotten him motivated to move more.  He is feeling better overall and able to do more now than when he started despite his legs hurting more.      Expected Outcomes Short: Continue to attend rehab. Long: Continue to stay positive and away from stress. Continue to stay positive and keep moving      Interventions Encouraged to attend Cardiac Rehabilitation for the exercise Encouraged to attend Cardiac Rehabilitation for the exercise      Continue Psychosocial Services  Follow up required by staff Follow up required by staff         Psychosocial Discharge (Final Psychosocial Re-Evaluation):  Psychosocial Re-Evaluation - 12/25/23 1332       Psychosocial Re-Evaluation   Current issues with None Identified    Comments Stephen Koch has enjoyed the program. He says it has gotten him motivated to move more.  He is feeling better overall and able to do more now than when he started despite his legs hurting more.    Expected Outcomes Continue to stay positive and keep moving    Interventions Encouraged to attend Cardiac Rehabilitation for the exercise    Continue Psychosocial Services  Follow up required by staff          Vocational Rehabilitation: Provide vocational rehab assistance to qualifying candidates.   Vocational Rehab Evaluation & Intervention:  Vocational Rehab - 10/08/23 1527       Initial Vocational Rehab Evaluation & Intervention   Assessment shows need for Vocational Rehabilitation No      Vocational Rehab Re-Evaulation   Comments Patient is disabled.          Education: Education Goals: Education classes will be provided on a variety of topics geared toward better understanding of heart  health and risk factor modification. Participant will state understanding/return demonstration of topics presented as noted by education test scores.  Learning Barriers/Preferences:  Learning Barriers/Preferences - 10/08/23 1404       Learning Barriers/Preferences   Learning Barriers None    Learning Preferences Audio;Written Material;Skilled Demonstration          General Cardiac Education Topics:  AED/CPR: - Group verbal and written instruction with the use of models to demonstrate the basic use of the AED with the basic ABC's of resuscitation.   Test and Procedures: - Group verbal and visual presentation and models provide information about basic cardiac anatomy and function. Reviews the testing methods done to diagnose heart disease and the outcomes of the test results. Describes the treatment choices: Medical Management, Angioplasty, or Coronary Bypass Surgery for treating various heart conditions including Myocardial Infarction, Angina, Valve Disease, and Cardiac Arrhythmias. Written material provided at class time. Flowsheet Row CARDIAC REHAB PHASE II EXERCISE from 01/06/2024 in [place] [facility]  Date 12/16/23  Educator DJ  Instruction Review Code 1- Verbalizes Understanding  Medication Safety: - Group verbal and visual instruction to review commonly prescribed medications for heart and lung disease. Reviews the medication, class of the drug, and side effects. Includes the steps to properly store meds and maintain the prescription regimen. Written material provided at class time.   Intimacy: - Group verbal instruction through game format to discuss how heart and lung disease can affect sexual intimacy. Written material provided at class time. Flowsheet Row CARDIAC REHAB PHASE II EXERCISE from 01/06/2024 in [place] [facility]  Date 12/23/23  Educator jh  Instruction Review Code 2- Demonstrated Understanding    Know Your Numbers and Heart  Failure: - Group verbal and visual instruction to discuss disease risk factors for cardiac and pulmonary disease and treatment options.  Reviews associated critical values for Overweight/Obesity, Hypertension, Cholesterol, and Diabetes.  Discusses basics of heart failure: signs/symptoms and treatments.  Introduces Heart Failure Zone chart for action plan for heart failure. Written material provided at class time.   Infection Prevention: - Provides verbal and written material to individual with discussion of infection control including proper hand washing and proper equipment cleaning during exercise session.   Falls Prevention: - Provides verbal and written material to individual with discussion of falls prevention and safety.   Other: -Provides group and verbal instruction on various topics (see comments)   Knowledge Questionnaire Score:  Knowledge Questionnaire Score - 01/06/24 1047       Knowledge Questionnaire Score   Post Score 23/28          Core Components/Risk Factors/Patient Goals at Admission:  Personal Goals and Risk Factors at Admission - 10/08/23 1527       Core Components/Risk Factors/Patient Goals on Admission    Weight Management Weight Maintenance    Improve shortness of breath with ADL's Yes    Intervention Provide education, individualized exercise plan and daily activity instruction to help decrease symptoms of SOB with activities of daily living.    Expected Outcomes Short Term: Improve cardiorespiratory fitness to achieve a reduction of symptoms when performing ADLs;Long Term: Be able to perform more ADLs without symptoms or delay the onset of symptoms    Heart Failure Yes    Intervention Provide a combined exercise and nutrition program that is supplemented with education, support and counseling about heart failure. Directed toward relieving symptoms such as shortness of breath, decreased exercise tolerance, and extremity edema.    Expected Outcomes  Improve functional capacity of life;Short term: Attendance in program 2-3 days a week with increased exercise capacity. Reported lower sodium intake. Reported increased fruit and vegetable intake. Reports medication compliance.;Short term: Daily weights obtained and reported for increase. Utilizing diuretic protocols set by physician.;Long term: Adoption of self-care skills and reduction of barriers for early signs and symptoms recognition and intervention leading to self-care maintenance.    Hypertension Yes    Intervention Provide education on lifestyle modifcations including regular physical activity/exercise, weight management, moderate sodium restriction and increased consumption of fresh fruit, vegetables, and low fat dairy, alcohol  moderation, and smoking cessation.;Monitor prescription use compliance.    Expected Outcomes Short Term: Continued assessment and intervention until BP is < 140/52mm HG in hypertensive participants. < 130/3mm HG in hypertensive participants with diabetes, heart failure or chronic kidney disease.;Long Term: Maintenance of blood pressure at goal levels.    Lipids Yes    Intervention Provide education and support for participant on nutrition & aerobic/resistive exercise along with prescribed medications to achieve LDL 70mg , HDL >40mg .    Expected Outcomes Short  Term: Participant states understanding of desired cholesterol values and is compliant with medications prescribed. Participant is following exercise prescription and nutrition guidelines.;Long Term: Cholesterol controlled with medications as prescribed, with individualized exercise RX and with personalized nutrition plan. Value goals: LDL < 70mg , HDL > 40 mg.          Education:Diabetes - Individual verbal and written instruction to review signs/symptoms of diabetes, desired ranges of glucose level fasting, after meals and with exercise. Acknowledge that pre and post exercise glucose checks will be done for 3  sessions at entry of program.   Core Components/Risk Factors/Patient Goals Review:   Goals and Risk Factor Review     Row Name 11/16/23 1003 12/25/23 1333           Core Components/Risk Factors/Patient Goals Review   Personal Goals Review Hypertension;Weight Management/Obesity Hypertension;Weight Management/Obesity      Review Stephen Koch is doing well in rehab. He states he is taking all his medications as prescribed and checks his BP occassionally at home. Stephen Koch is doing well in rehab and nearing graduation.  His weight is fairly steady overall.  His pressures are doing well.  He will continue to keep an eye on them to maintain.      Expected Outcomes Short: Continue to attend rehab. Long: Continue checking BP at home and report any abnormalities to medical staff. Continue to monitor risk factors.         Core Components/Risk Factors/Patient Goals at Discharge (Final Review):   Goals and Risk Factor Review - 12/25/23 1333       Core Components/Risk Factors/Patient Goals Review   Personal Goals Review Hypertension;Weight Management/Obesity    Review Stephen Koch is doing well in rehab and nearing graduation.  His weight is fairly steady overall.  His pressures are doing well.  He will continue to keep an eye on them to maintain.    Expected Outcomes Continue to monitor risk factors.          ITP Comments:  ITP Comments     Row Name 10/08/23 1539 10/09/23 1105 10/14/23 1004 11/11/23 0833 11/27/23 0933   ITP Comments Patient arrived for 1st visit/orientation/education at 1330. Patient was referred to CR by Dr. Lurena Red due to S/P AV replacement. During orientation advised patient on arrival and appointment times what to wear, what to do before, during and after exercise. Reviewed attendance and class policy.  Pt is scheduled to return Cardiac Rehab on 10/09/23 at 915. Pt was advised to come to class 15 minutes before class starts.  Discussed RPE/Dpysnea scales. Patient participated in warm up  stretches. Patient was able to complete 6 minute walk test.  Telemetry:NSR with some PVC's. Patient was measured for the equipment. Discussed equipment safety with patient. Took patient pre-anthropometric measurements. Patient finished visit at 1510. First full day of exercise!  Patient was oriented to gym and equipment including functions, settings, policies, and procedures.  Patient's individual exercise prescription and treatment plan were reviewed.  All starting workloads were established based on the results of the 6 minute walk test done at initial orientation visit.  The plan for exercise progression was also introduced and progression will be customized based on patient's performance and goals. 30 day review completed. ITP sent to Dr. Dorn Ross, Medical Director of Cardiac Rehab. Continue with ITP unless changes are made by physician. Pt new to program 30 day review completed. ITP sent to Dr. Dorn Ross, Medical Director of Cardiac Rehab. Continue with ITP unless changes are made  by physician. Called patient. He continues to have pain throughout his body. He plans to return Monday.    Row Name 12/09/23 0931 12/09/23 1002 01/06/24 1434 01/11/24 0922     ITP Comments Called patient regarding missed sessions. Left VM. 30 day review completed. ITP sent to Dr. Dorn Ross, Medical Director of Cardiac Rehab. Continue with ITP unless changes are made by physician. 30 day review completed. ITP sent to Dr. Dorn Ross, Medical Director of Cardiac Rehab. Continue with ITP unless changes are made by physician. Eliaz graduated today from  rehab with 36 sessions completed.  Details of the patient's exercise prescription and what He needs to do in order to continue the prescription and progress were discussed with patient.  Patient was given a copy of prescription and goals.  Patient verbalized understanding. Stephen Koch plans to continue to exercise by coming back to maintance or join a gym.        Comments: Discharge ITP

## 2024-01-11 NOTE — Progress Notes (Signed)
 Discharge Progress Report  Patient Details  Name: Stephen Koch MRN: [id number] Date of Birth: [birth year] Referring Provider:   Flowsheet Row CARDIAC REHAB PHASE II ORIENTATION from 10/08/2023 in [facility]  Referring Provider Wendel Haws MD     Number of Visits: 36  Reason for Discharge:  Patient reached a stable level of exercise. Patient independent in their exercise. Patient has met program and personal goals.  Smoking History:  Social History   Tobacco Use  Smoking Status Every Day   Types: Cigars  Smokeless Tobacco Never  Tobacco Comments   smokes about 1 black and milds per day     Diagnosis:  S/P aortic valve replacement with bioprosthetic valve  ADL UCSD:   Initial Exercise Prescription:  Initial Exercise Prescription - 10/08/23 1500       Date of Initial Exercise RX and Referring Provider   Date 10/08/23    Referring Provider Wendel Haws MD      Treadmill   MPH 0.8    Grade 0    Minutes 15    METs 1.7      NuStep   Level 3    SPM 60    Minutes 15    METs 2      Prescription Details   Frequency (times per week) 3    Duration Progress to 30 minutes of continuous aerobic without signs/symptoms of physical distress      Intensity   THRR 40-80% of Max Heartrate 93-134    Ratings of Perceived Exertion 11-13    Perceived Dyspnea 0-4      Resistance Training   Training Prescription Yes    Weight 4    Reps 10-15          Discharge Exercise Prescription (Final Exercise Prescription Changes):  Exercise Prescription Changes - 12/11/23 0800       Response to Exercise   Blood Pressure (Admit) 122/56    Blood Pressure (Exit) 132/74    Heart Rate (Admit) 69 bpm    Heart Rate (Exercise) 104 bpm    Heart Rate (Exit) 77 bpm    Rating of Perceived Exertion (Exercise) 15    Duration Continue with 30 min of aerobic exercise without signs/symptoms of physical distress.    Intensity THRR unchanged      Progression    Progression Continue to progress workloads to maintain intensity without signs/symptoms of physical distress.      Resistance Training   Training Prescription Yes    Weight 4    Reps 10-15      NuStep   Level 5    SPM 94    Minutes 15    METs 2.4      Recumbant Elliptical   Level 6    RPM 51    Minutes 15    METs 4.5          Functional Capacity:  6 Minute Walk     Row Name 10/08/23 1515 12/25/23 0930       6 Minute Walk   Phase Initial Discharge    Distance 900 feet 880 feet    Distance % Change -- -2.2 %    Distance Feet Change -- 20 ft    Walk Time 6 minutes 6 minutes    # of Rest Breaks 0 0    MPH 1.7 1.67    METS 2.62 2.48    RPE 13 15    Perceived Dyspnea  1 --  VO2 Peak 9.16 8.69    Symptoms No Yes (comment)    Comments -- chronic leg pain 8/10 (has worsened throughout program, now using rollator)    Resting HR 51 bpm 72 bpm    Resting BP 150/100 122/60    Resting Oxygen Saturation  97 % --    Exercise Oxygen Saturation  during 6 min walk 97 % --    Max Ex. HR 63 bpm 91 bpm    Max Ex. BP 158/90 132/64    2 Minute Post BP 148/90 --       Psychological, QOL, Others - Outcomes: PHQ 2/9:    01/06/2024   10:47 AM 10/08/2023    3:24 PM  Depression screen PHQ 2/9  Decreased Interest 1 0  Down, Depressed, Hopeless 0 0  PHQ - 2 Score 1 0  Altered sleeping 0 3  Tired, decreased energy 2 1  Change in appetite 0 3  Feeling bad or failure about yourself  0 0  Trouble concentrating 0 0  Moving slowly or fidgety/restless 1 3  Suicidal thoughts 0 0  PHQ-9 Score 4 10  Difficult doing work/chores Not difficult at all Very difficult   Nutrition & Weight - Outcomes:  Pre Biometrics - 10/08/23 1520       Pre Biometrics   Height 6' 2 (1.88 m)    Weight 83.8 kg    Waist Circumference 39 inches    Hip Circumference 40 inches    Waist to Hip Ratio 0.98 %    BMI (Calculated) 23.71    Grip Strength 40.7 kg    Single Leg Stand 2.32 seconds           Post Biometrics - 12/25/23 0932        Post  Biometrics   Height 6' 2 (1.88 m)    Weight 96.2 kg    Waist Circumference 39 inches    Hip Circumference 41 inches    Waist to Hip Ratio 0.95 %    BMI (Calculated) 27.22    Grip Strength 46.8 kg    Single Leg Stand 4.6 seconds         Goals reviewed with patient; copy given to patient.

## 2024-01-11 NOTE — Progress Notes (Unsigned)
 GI Office Note    Referring Provider: Leigh Lung, MD Primary Care Physician:  Leigh Lung, MD  Primary Gastroenterologist: Carlin POUR. Cindie, DO   Chief Complaint   No chief complaint on file.   History of Present Illness   Stephen Koch is a 63 y.o. male presenting today for follow up. Last seen June. H/o chronic GERD, constipation.   Recent admission for syncope suspected vasovagal.   Prior Data     EGD 12/2018 with mild gastritis, bx neg for H.pylori.  Colonoscopy 12/2018 with internal hemorrhoids s/p banding X 3.     Medications   Current Outpatient Medications  Medication Sig Dispense Refill   amLODipine  (NORVASC ) 10 MG tablet Take 10 mg by mouth daily.     atorvastatin  (LIPITOR) 40 MG tablet Take 40 mg by mouth daily.     chlorthalidone  (HYGROTON ) 25 MG tablet Take 1 tablet (25 mg total) by mouth daily. 90 tablet 3   gabapentin  (NEURONTIN ) 300 MG capsule Take 300 mg by mouth 2 (two) times daily.      methocarbamol  (ROBAXIN ) 500 MG tablet Take 500 mg by mouth 2 (two) times daily.     metoprolol  tartrate (LOPRESSOR ) 25 MG tablet Take 1 tablet (25 mg total) by mouth 2 (two) times daily. 60 tablet 1   Multiple Vitamin (MULTIVITAMIN WITH MINERALS) TABS tablet Take 1 tablet by mouth daily.     naproxen (NAPROSYN) 500 MG tablet Take 500 mg by mouth 2 (two) times daily as needed for moderate pain (pain score 4-6).     omeprazole  (PRILOSEC) 20 MG capsule TAKE 1 CAPSULE BY MOUTH DAILY BEFORE BREAKFAST. MAY TAKE ADDITIONAL CAPSULE BEFORE SUPPER IF NEEDED (Patient taking differently: Take 20 mg by mouth daily. *May take one additional capsule at bedtime as needed for acid reflux) 180 capsule 3   Plecanatide  (TRULANCE ) 3 MG TABS Take 1 tablet (3 mg total) by mouth daily. 30 tablet 11   tamsulosin  (FLOMAX ) 0.4 MG CAPS capsule Take 0.4 mg by mouth daily.     No current facility-administered medications for this visit.    Allergies   Allergies as of 01/12/2024 -  Review Complete 12/25/2023  Allergen Reaction Noted   Ibuprofen  Other (See Comments) 04/06/2015     Past Medical History   Past Medical History:  Diagnosis Date   Arthritis    Chronic back pain    Forklift injury   Essential hypertension, benign    GERD (gastroesophageal reflux disease)    Heart murmur    History of medication noncompliance     Past Surgical History   Past Surgical History:  Procedure Laterality Date   ABDOMINAL AORTOGRAM N/A 06/18/2023   Procedure: ABDOMINAL AORTOGRAM;  Surgeon: Thukkani, Arun K, MD;  Location: [facility];  Service: Cardiovascular;  Laterality: N/A;   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    ESOPHAGOGASTRODUODENOSCOPY N/A 04/08/2013   DOQ:dfjoo gastric ulcer/duodenal inflammation. bx with chronic active gastritis with  focal intestinal metaplasia, ulceration and H pylori   ESOPHAGOGASTRODUODENOSCOPY (EGD) WITH PROPOFOL  N/A 01/04/2019   moderate gastritis s/p biopsy, negative Hpylori   HEMORRHOID BANDING N/A 01/04/2019   Procedure: HEMORRHOID BANDING;  Surgeon: Harvey Margo CROME, MD;  Location: [facility];  Service: Endoscopy;  Laterality: N/A;   Left knee surgery Left    arthroscopy   QUADRICEPS TENDON REPAIR Left 03/05/2017   Procedure: REPAIR QUADRICEP TENDON;  Surgeon: Margrette Taft BRAVO, MD;  Location: [facility];  Service: Orthopedics;  Laterality: Left;   Right arm surgery     tendon repair   RIGHT HEART CATH AND  CORONARY ANGIOGRAPHY N/A 06/18/2023   Procedure: RIGHT HEART CATH AND CORONARY ANGIOGRAPHY;  Surgeon: Wendel Lurena POUR, MD;  Location: [facility];  Service: Cardiovascular;  Laterality: N/A;   TEE WITHOUT CARDIOVERSION N/A 08/20/2023   Procedure: ECHOCARDIOGRAM, TRANSESOPHAGEAL;  Surgeon: Maryjane Mt, MD;  Location: [facility] OR;  Service: Open Heart Surgery;  Laterality: N/A;    Past Family History   Family History  Problem Relation Age of Onset   Heart disease Father    Colon cancer Neg Hx    Colon polyps Neg Hx     Past Social History   Social History   Socioeconomic History   Marital status: Married    Spouse name: Not on file   Number of children: 4   Years of education: Not on file   Highest education level: Not on file  Occupational History   Occupation: Unemployed; previously Naval architect work  Tobacco Use   Smoking status: Every Day    Types: Cigars   Smokeless tobacco: Never   Tobacco comments:    smokes about 1 black and milds per day   Vaping Use   Vaping status: Never Used  Substance and Sexual Activity   Alcohol  use: Yes    Alcohol /week: 3.0 - 5.0 standard drinks of alcohol     Types: 3 - 5 Cans of beer per week   Drug use: Yes    Types: Marijuana    Comment: Marijuana 1-2 times per week   Sexual activity: Yes    Birth control/protection: None  Other Topics Concern   Not on file  Social History Narrative   Lives with wife and sister temporarily living with them   Social Drivers of Health   Financial Resource Strain: Not on file  Food Insecurity: No Food Insecurity (12/25/2023)   Hunger Vital Sign    Worried About Running Out of Food in the Last Year: Never true    Ran Out of Food in the Last Year: Never true  Transportation Needs: No Transportation Needs (12/25/2023)   PRAPARE - Administrator, Civil Service (Medical): No    Lack of Transportation (Non-Medical): No  Physical Activity: Not on file  Stress: Not on file  Social  Connections: Socially Integrated (12/25/2023)   Social Connection and Isolation Panel    Frequency of Communication with Friends and Family: More than three times a week    Frequency of Social Gatherings with Friends and Family: More than three times a week    Attends Religious Services: More than 4 times per year    Active Member of [employer] or Organizations: Yes    Attends Banker Meetings: More than 4 times per year    Marital Status: Married  Catering manager Violence: Not At Risk (12/25/2023)   Humiliation, Afraid, Rape, and Kick questionnaire    Fear of Current or Ex-Partner:  No    Emotionally Abused: No    Physically Abused: No    Sexually Abused: No    Review of Systems   General: Negative for anorexia, weight loss, fever, chills, fatigue, weakness. ENT: Negative for hoarseness, difficulty swallowing , nasal congestion. CV: Negative for chest pain, angina, palpitations, dyspnea on exertion, peripheral edema.  Respiratory: Negative for dyspnea at rest, dyspnea on exertion, cough, sputum, wheezing.  GI: See history of present illness. GU:  Negative for dysuria, hematuria, urinary incontinence, urinary frequency, nocturnal urination.  Endo: Negative for unusual weight change.     Physical Exam   There were no vitals taken for this visit.   General: Well-nourished, well-developed in no acute distress.  Eyes: No icterus. Mouth: Oropharyngeal mucosa moist and pink   Lungs: Clear to auscultation bilaterally.  Heart: Regular rate and rhythm, no murmurs rubs or gallops.  Abdomen: Bowel sounds are normal, nontender, nondistended, no hepatosplenomegaly or masses,  no abdominal bruits or hernia , no rebound or guarding.  Rectal: not performed Extremities: No lower extremity edema. No clubbing or deformities. Neuro: Alert and oriented x 4   Skin: Warm and dry, no jaundice.   Psych: Alert and cooperative, normal mood and affect.  Labs   Lab Results  Component Value  Date   NA 141 12/26/2023   CL 105 12/26/2023   K 3.5 12/26/2023   CO2 26 12/26/2023   BUN 15 12/26/2023   CREATININE 0.87 12/26/2023   GFRNONAA >60 12/26/2023   CALCIUM  8.9 12/26/2023   ALBUMIN  3.7 12/25/2023   GLUCOSE 100 (H) 12/26/2023   Lab Results  Component Value Date   WBC 6.0 12/26/2023   HGB 13.4 12/26/2023   HCT 41.3 12/26/2023   MCV 79.0 (L) 12/26/2023   PLT 214 12/26/2023   Lab Results  Component Value Date   ALT 18 12/25/2023   AST 20 12/25/2023   ALKPHOS 62 12/25/2023   BILITOT 0.8 12/25/2023    Imaging Studies   CT Head Wo Contrast Result Date: 12/25/2023 CLINICAL DATA:  Syncope/presyncope, cerebrovascular cause suspected EXAM: CT HEAD WITHOUT CONTRAST TECHNIQUE: Contiguous axial images were obtained from the base of the skull through the vertex without intravenous contrast. RADIATION DOSE REDUCTION: This exam was performed according to the departmental dose-optimization program which includes automated exposure control, adjustment of the mA and/or kV according to patient size and/or use of iterative reconstruction technique. COMPARISON:  Remote head CT 11/11/2010 FINDINGS: Brain: No intracranial hemorrhage, mass effect, or midline shift. No hydrocephalus. Minimal bilateral basal gangliar mineralization, likely senescent. The basilar cisterns are patent. No evidence of territorial infarct or acute ischemia. No extra-axial or intracranial fluid collection. Vascular: Atherosclerosis of skullbase vasculature without hyperdense vessel or abnormal calcification. Skull: No fracture or focal lesion. Sinuses/Orbits: Paranasal sinuses and mastoid air cells are clear. The visualized orbits are unremarkable. Other: None. IMPRESSION: No acute intracranial abnormality. Electronically Signed   By: Andrea Gasman M.D.   On: 12/25/2023 18:03   DG Chest Port 1 View Result Date: 12/25/2023 CLINICAL DATA:  Syncope EXAM: PORTABLE CHEST - 1 VIEW COMPARISON:  09/29/2023 FINDINGS:  Unchanged mild cardiomegaly. No pulmonary vascular congestion. Median sternotomy changes again seen. Lungs are clear. IMPRESSION: Unchanged mild cardiomegaly. Electronically Signed   By: Aliene Lloyd M.D.   On: 12/25/2023 15:13    Assessment/Plan:           Sonny RAMAN. Ezzard, MHS, PA-C [facility]

## 2024-01-12 ENCOUNTER — Encounter: Payer: Self-pay | Admitting: Gastroenterology

## 2024-01-12 ENCOUNTER — Ambulatory Visit: Admitting: Gastroenterology

## 2024-01-12 VITALS — BP 139/88 | HR 76 | Temp 98.3°F | Ht 75.0 in | Wt 204.8 lb

## 2024-01-12 DIAGNOSIS — K760 Fatty (change of) liver, not elsewhere classified: Secondary | ICD-10-CM | POA: Diagnosis not present

## 2024-01-12 DIAGNOSIS — K5900 Constipation, unspecified: Secondary | ICD-10-CM | POA: Diagnosis not present

## 2024-01-12 DIAGNOSIS — K219 Gastro-esophageal reflux disease without esophagitis: Secondary | ICD-10-CM

## 2024-01-12 NOTE — Patient Instructions (Signed)
 Continue omeprazole  20mg  daily before breakfast, can take a dose before supper if needed to control heartburn and indigestion. Continue Trulance  3mg  daily for constipation. Let me know if medication stops working well for you. We have several options if needed. Continue to cut back on your alcohol  consumption. You do have a fatty liver, likely in part due to heavy alcohol  use. Cutting back alcohol  can allow your liver to continue functioning well.  Return to the office in six months or sooner if needed. Please let me know if you have any problems like abdominal pain, vomiting, black or bloody stools.

## 2024-01-13 ENCOUNTER — Encounter (HOSPITAL_COMMUNITY)

## 2024-01-15 ENCOUNTER — Ambulatory Visit (HOSPITAL_COMMUNITY)

## 2024-01-18 ENCOUNTER — Encounter (HOSPITAL_COMMUNITY)

## 2024-02-04 DIAGNOSIS — R55 Syncope and collapse: Secondary | ICD-10-CM | POA: Diagnosis not present

## 2024-02-22 DIAGNOSIS — R55 Syncope and collapse: Secondary | ICD-10-CM

## 2024-03-11 ENCOUNTER — Ambulatory Visit: Payer: Self-pay | Admitting: Internal Medicine

## 2024-04-12 NOTE — Telephone Encounter (Signed)
 Called patient to check in on him for missing class. Patient's legs are bothering him, which is a known issue. Gave our number to call if he isn't going to make it.
